# Patient Record
Sex: Female | Born: 1960 | Race: White | Hispanic: No | Marital: Married | Smoking: Former smoker
Health system: Southern US, Community
[De-identification: ages and names within clinical notes are randomized; demographics above are authoritative.]

## PROBLEM LIST (undated history)

## (undated) DIAGNOSIS — J9610 Chronic respiratory failure, unspecified whether with hypoxia or hypercapnia: Secondary | ICD-10-CM

## (undated) DIAGNOSIS — Z8774 Personal history of (corrected) congenital malformations of heart and circulatory system: Secondary | ICD-10-CM

## (undated) DIAGNOSIS — I700 Atherosclerosis of aorta: Secondary | ICD-10-CM

## (undated) DIAGNOSIS — I209 Angina pectoris, unspecified: Secondary | ICD-10-CM

## (undated) DIAGNOSIS — J189 Pneumonia, unspecified organism: Secondary | ICD-10-CM

## (undated) DIAGNOSIS — Z960 Presence of urogenital implants: Secondary | ICD-10-CM

## (undated) DIAGNOSIS — T8859XA Other complications of anesthesia, initial encounter: Secondary | ICD-10-CM

## (undated) DIAGNOSIS — F32A Depression, unspecified: Secondary | ICD-10-CM

## (undated) DIAGNOSIS — D649 Anemia, unspecified: Secondary | ICD-10-CM

## (undated) DIAGNOSIS — F419 Anxiety disorder, unspecified: Secondary | ICD-10-CM

## (undated) DIAGNOSIS — I480 Paroxysmal atrial fibrillation: Secondary | ICD-10-CM

## (undated) DIAGNOSIS — F329 Major depressive disorder, single episode, unspecified: Secondary | ICD-10-CM

## (undated) DIAGNOSIS — I639 Cerebral infarction, unspecified: Secondary | ICD-10-CM

## (undated) DIAGNOSIS — Z7901 Long term (current) use of anticoagulants: Secondary | ICD-10-CM

## (undated) DIAGNOSIS — K222 Esophageal obstruction: Secondary | ICD-10-CM

## (undated) DIAGNOSIS — E119 Type 2 diabetes mellitus without complications: Secondary | ICD-10-CM

## (undated) DIAGNOSIS — M51369 Other intervertebral disc degeneration, lumbar region without mention of lumbar back pain or lower extremity pain: Secondary | ICD-10-CM

## (undated) DIAGNOSIS — J449 Chronic obstructive pulmonary disease, unspecified: Secondary | ICD-10-CM

## (undated) DIAGNOSIS — IMO0001 Reserved for inherently not codable concepts without codable children: Secondary | ICD-10-CM

## (undated) DIAGNOSIS — Z7952 Long term (current) use of systemic steroids: Secondary | ICD-10-CM

## (undated) DIAGNOSIS — E785 Hyperlipidemia, unspecified: Secondary | ICD-10-CM

## (undated) DIAGNOSIS — Z8489 Family history of other specified conditions: Secondary | ICD-10-CM

## (undated) DIAGNOSIS — Z9981 Dependence on supplemental oxygen: Secondary | ICD-10-CM

## (undated) DIAGNOSIS — J45909 Unspecified asthma, uncomplicated: Secondary | ICD-10-CM

## (undated) DIAGNOSIS — K219 Gastro-esophageal reflux disease without esophagitis: Secondary | ICD-10-CM

## (undated) DIAGNOSIS — G4730 Sleep apnea, unspecified: Secondary | ICD-10-CM

## (undated) DIAGNOSIS — I471 Supraventricular tachycardia, unspecified: Secondary | ICD-10-CM

## (undated) DIAGNOSIS — Z79899 Other long term (current) drug therapy: Secondary | ICD-10-CM

## (undated) DIAGNOSIS — G3184 Mild cognitive impairment, so stated: Secondary | ICD-10-CM

## (undated) DIAGNOSIS — I10 Essential (primary) hypertension: Secondary | ICD-10-CM

## (undated) DIAGNOSIS — E039 Hypothyroidism, unspecified: Secondary | ICD-10-CM

## (undated) DIAGNOSIS — R112 Nausea with vomiting, unspecified: Secondary | ICD-10-CM

## (undated) HISTORY — PX: BLADDER SURGERY: SHX569

## (undated) HISTORY — PX: ANKLE SURGERY: SHX546

## (undated) HISTORY — PX: TUBAL LIGATION: SHX77

## (undated) HISTORY — DX: Presence of urogenital implants: Z96.0

## (undated) HISTORY — PX: WRIST SURGERY: SHX841

## (undated) HISTORY — PX: ESOPHAGEAL DILATION: SHX303

## (undated) SURGERY — Surgical Case
Anesthesia: *Unknown

---

## 2007-01-08 ENCOUNTER — Other Ambulatory Visit: Payer: Self-pay

## 2007-01-08 ENCOUNTER — Emergency Department: Payer: Self-pay | Admitting: Emergency Medicine

## 2007-01-21 ENCOUNTER — Ambulatory Visit: Payer: Self-pay | Admitting: Nurse Practitioner

## 2007-07-26 ENCOUNTER — Emergency Department: Payer: Self-pay | Admitting: Emergency Medicine

## 2012-03-09 ENCOUNTER — Emergency Department: Payer: Self-pay | Admitting: *Deleted

## 2012-03-10 LAB — CBC WITH DIFFERENTIAL/PLATELET
Basophil %: 0.4 %
Eosinophil #: 0.1 10*3/uL (ref 0.0–0.7)
Eosinophil %: 0.5 %
HGB: 11.2 g/dL — ABNORMAL LOW (ref 12.0–16.0)
Lymphocyte #: 3.5 10*3/uL (ref 1.0–3.6)
MCH: 23.3 pg — ABNORMAL LOW (ref 26.0–34.0)
MCHC: 30.9 g/dL — ABNORMAL LOW (ref 32.0–36.0)
Monocyte #: 1.3 x10 3/mm — ABNORMAL HIGH (ref 0.2–0.9)
Monocyte %: 9.2 %
Neutrophil #: 9.6 10*3/uL — ABNORMAL HIGH (ref 1.4–6.5)
Neutrophil %: 65.7 %
RDW: 17.6 % — ABNORMAL HIGH (ref 11.5–14.5)

## 2012-03-10 LAB — COMPREHENSIVE METABOLIC PANEL
BUN: 21 mg/dL — ABNORMAL HIGH (ref 7–18)
Bilirubin,Total: 0.2 mg/dL (ref 0.2–1.0)
Calcium, Total: 9 mg/dL (ref 8.5–10.1)
Chloride: 106 mmol/L (ref 98–107)
EGFR (African American): >60
EGFR (Non-African Amer.): >60
Glucose: 94 mg/dL (ref 65–99)
Osmolality: 284 (ref 275–301)
Potassium: 3.8 mmol/L (ref 3.5–5.1)
SGOT(AST): 14 U/L — ABNORMAL LOW (ref 15–37)
SGPT (ALT): 31 U/L
Sodium: 141 mmol/L (ref 136–145)
Total Protein: 6.5 g/dL (ref 6.4–8.2)

## 2012-03-10 LAB — SEDIMENTATION RATE: Erythrocyte Sed Rate: 10 mm/hr (ref 0–20)

## 2012-06-03 ENCOUNTER — Ambulatory Visit: Payer: Self-pay | Admitting: Oncology

## 2012-06-11 ENCOUNTER — Ambulatory Visit: Payer: Self-pay | Admitting: Oncology

## 2012-06-16 LAB — RETICULOCYTES: Reticulocyte: 1.2 % (ref 0.5–2.2)

## 2012-06-16 LAB — CBC CANCER CENTER
Basophil #: 0.1 x10 3/mm (ref 0.0–0.1)
Basophil %: 0.7 %
Eosinophil #: 0.2 x10 3/mm (ref 0.0–0.7)
Eosinophil %: 2.3 %
HCT: 35.5 % (ref 35.0–47.0)
Lymphocyte #: 2.5 x10 3/mm (ref 1.0–3.6)
Monocyte #: 0.9 x10 3/mm (ref 0.2–0.9)
Neutrophil #: 5.8 x10 3/mm (ref 1.4–6.5)
Platelet: 362 x10 3/mm (ref 150–440)
RDW: 19.3 % — ABNORMAL HIGH (ref 11.5–14.5)
WBC: 9.5 x10 3/mm (ref 3.6–11.0)

## 2012-06-16 LAB — IRON AND TIBC
Iron Bind.Cap.(Total): 472 ug/dL — ABNORMAL HIGH (ref 250–450)
Iron: 35 ug/dL — ABNORMAL LOW (ref 50–170)

## 2012-06-16 LAB — SEDIMENTATION RATE: Erythrocyte Sed Rate: 9 mm/hr (ref 0–20)

## 2012-06-16 LAB — LACTATE DEHYDROGENASE: LDH: 276 U/L — ABNORMAL HIGH (ref 81–234)

## 2012-06-16 LAB — FOLATE: Folic Acid: 13.1 ng/mL (ref 3.1–100.0)

## 2012-06-17 LAB — PROT IMMUNOELECTROPHORES(ARMC)

## 2012-07-12 ENCOUNTER — Ambulatory Visit: Payer: Self-pay | Admitting: Oncology

## 2012-08-26 ENCOUNTER — Ambulatory Visit: Payer: Self-pay | Admitting: Oncology

## 2012-08-26 LAB — CBC CANCER CENTER
Basophil %: 0.3 %
Eosinophil #: 0.2 x10 3/mm (ref 0.0–0.7)
HCT: 39.8 % (ref 35.0–47.0)
HGB: 13.1 g/dL (ref 12.0–16.0)
MCH: 27.8 pg (ref 26.0–34.0)
MCHC: 33.1 g/dL (ref 32.0–36.0)
Monocyte #: 0.7 x10 3/mm (ref 0.2–0.9)
Neutrophil #: 5.5 x10 3/mm (ref 1.4–6.5)
Neutrophil %: 67.6 %
Platelet: 248 x10 3/mm (ref 150–440)
WBC: 8.1 x10 3/mm (ref 3.6–11.0)

## 2012-08-26 LAB — CREATININE, SERUM
EGFR (African American): >60
EGFR (Non-African Amer.): >60

## 2012-08-26 LAB — IRON AND TIBC
Iron Saturation: 19 %
Iron: 63 ug/dL (ref 50–170)
Unbound Iron-Bind.Cap.: 267 ug/dL

## 2012-09-11 ENCOUNTER — Ambulatory Visit: Payer: Self-pay | Admitting: Oncology

## 2012-10-11 ENCOUNTER — Ambulatory Visit: Payer: Self-pay | Admitting: Oncology

## 2012-10-18 DIAGNOSIS — R0602 Shortness of breath: Secondary | ICD-10-CM | POA: Insufficient documentation

## 2013-06-24 DIAGNOSIS — Z7682 Awaiting organ transplant status: Secondary | ICD-10-CM | POA: Insufficient documentation

## 2013-07-27 ENCOUNTER — Encounter: Payer: Self-pay | Admitting: Internal Medicine

## 2013-08-11 ENCOUNTER — Encounter: Payer: Self-pay | Admitting: Internal Medicine

## 2013-09-11 ENCOUNTER — Encounter: Payer: Self-pay | Admitting: Internal Medicine

## 2014-06-03 DIAGNOSIS — M5136 Other intervertebral disc degeneration, lumbar region: Secondary | ICD-10-CM | POA: Insufficient documentation

## 2014-06-03 DIAGNOSIS — M51369 Other intervertebral disc degeneration, lumbar region without mention of lumbar back pain or lower extremity pain: Secondary | ICD-10-CM | POA: Insufficient documentation

## 2014-06-03 DIAGNOSIS — M5416 Radiculopathy, lumbar region: Secondary | ICD-10-CM | POA: Insufficient documentation

## 2014-07-06 DIAGNOSIS — M7062 Trochanteric bursitis, left hip: Secondary | ICD-10-CM | POA: Insufficient documentation

## 2015-08-23 ENCOUNTER — Encounter: Payer: Self-pay | Admitting: *Deleted

## 2015-08-23 ENCOUNTER — Ambulatory Visit
Admission: RE | Admit: 2015-08-23 | Discharge: 2015-08-23 | Disposition: A | Discharge status: Home / Self Care | Payer: Medicare Other | Source: Ambulatory Visit | Attending: Cardiology | Admitting: Cardiology

## 2015-08-23 ENCOUNTER — Encounter
Admission: RE | Disposition: A | Discharge status: Home / Self Care | Payer: Self-pay | Source: Ambulatory Visit | Attending: Cardiology

## 2015-08-23 DIAGNOSIS — E785 Hyperlipidemia, unspecified: Secondary | ICD-10-CM | POA: Insufficient documentation

## 2015-08-23 DIAGNOSIS — M5136 Other intervertebral disc degeneration, lumbar region: Secondary | ICD-10-CM | POA: Diagnosis not present

## 2015-08-23 DIAGNOSIS — M549 Dorsalgia, unspecified: Secondary | ICD-10-CM | POA: Diagnosis not present

## 2015-08-23 DIAGNOSIS — K222 Esophageal obstruction: Secondary | ICD-10-CM | POA: Insufficient documentation

## 2015-08-23 DIAGNOSIS — G4730 Sleep apnea, unspecified: Secondary | ICD-10-CM | POA: Diagnosis not present

## 2015-08-23 DIAGNOSIS — G8929 Other chronic pain: Secondary | ICD-10-CM | POA: Insufficient documentation

## 2015-08-23 DIAGNOSIS — Z87891 Personal history of nicotine dependence: Secondary | ICD-10-CM | POA: Insufficient documentation

## 2015-08-23 DIAGNOSIS — Z882 Allergy status to sulfonamides status: Secondary | ICD-10-CM | POA: Diagnosis not present

## 2015-08-23 DIAGNOSIS — R0602 Shortness of breath: Secondary | ICD-10-CM | POA: Diagnosis not present

## 2015-08-23 DIAGNOSIS — Z825 Family history of asthma and other chronic lower respiratory diseases: Secondary | ICD-10-CM | POA: Insufficient documentation

## 2015-08-23 DIAGNOSIS — Z885 Allergy status to narcotic agent status: Secondary | ICD-10-CM | POA: Diagnosis not present

## 2015-08-23 DIAGNOSIS — G43909 Migraine, unspecified, not intractable, without status migrainosus: Secondary | ICD-10-CM | POA: Insufficient documentation

## 2015-08-23 DIAGNOSIS — R002 Palpitations: Secondary | ICD-10-CM | POA: Insufficient documentation

## 2015-08-23 DIAGNOSIS — Z79899 Other long term (current) drug therapy: Secondary | ICD-10-CM | POA: Insufficient documentation

## 2015-08-23 DIAGNOSIS — F419 Anxiety disorder, unspecified: Secondary | ICD-10-CM | POA: Diagnosis not present

## 2015-08-23 DIAGNOSIS — Z8249 Family history of ischemic heart disease and other diseases of the circulatory system: Secondary | ICD-10-CM | POA: Diagnosis not present

## 2015-08-23 DIAGNOSIS — R079 Chest pain, unspecified: Secondary | ICD-10-CM | POA: Insufficient documentation

## 2015-08-23 DIAGNOSIS — J449 Chronic obstructive pulmonary disease, unspecified: Secondary | ICD-10-CM | POA: Insufficient documentation

## 2015-08-23 DIAGNOSIS — Z833 Family history of diabetes mellitus: Secondary | ICD-10-CM | POA: Diagnosis not present

## 2015-08-23 DIAGNOSIS — M4802 Spinal stenosis, cervical region: Secondary | ICD-10-CM | POA: Diagnosis not present

## 2015-08-23 DIAGNOSIS — K589 Irritable bowel syndrome without diarrhea: Secondary | ICD-10-CM | POA: Insufficient documentation

## 2015-08-23 DIAGNOSIS — Z8489 Family history of other specified conditions: Secondary | ICD-10-CM | POA: Diagnosis not present

## 2015-08-23 DIAGNOSIS — I10 Essential (primary) hypertension: Secondary | ICD-10-CM | POA: Insufficient documentation

## 2015-08-23 DIAGNOSIS — R9439 Abnormal result of other cardiovascular function study: Secondary | ICD-10-CM | POA: Insufficient documentation

## 2015-08-23 HISTORY — DX: Angina pectoris, unspecified: I20.9

## 2015-08-23 HISTORY — DX: Essential (primary) hypertension: I10

## 2015-08-23 HISTORY — DX: Depression, unspecified: F32.A

## 2015-08-23 HISTORY — DX: Gastro-esophageal reflux disease without esophagitis: K21.9

## 2015-08-23 HISTORY — DX: Major depressive disorder, single episode, unspecified: F32.9

## 2015-08-23 HISTORY — DX: Pneumonia, unspecified organism: J18.9

## 2015-08-23 HISTORY — DX: Anxiety disorder, unspecified: F41.9

## 2015-08-23 HISTORY — DX: Sleep apnea, unspecified: G47.30

## 2015-08-23 HISTORY — DX: Chronic obstructive pulmonary disease, unspecified: J44.9

## 2015-08-23 HISTORY — PX: CARDIAC CATHETERIZATION: SHX172

## 2015-08-23 HISTORY — DX: Hypothyroidism, unspecified: E03.9

## 2015-08-23 HISTORY — DX: Type 2 diabetes mellitus without complications: E11.9

## 2015-08-23 HISTORY — DX: Reserved for inherently not codable concepts without codable children: IMO0001

## 2015-08-23 LAB — GLUCOSE, CAPILLARY: GLUCOSE-CAPILLARY: 63 mg/dL — AB (ref 65–99)

## 2015-08-23 SURGERY — LEFT HEART CATH
Anesthesia: Moderate Sedation | Laterality: Left

## 2015-08-23 MED ORDER — SODIUM CHLORIDE 0.9 % IJ SOLN: 3.0000 mL | INTRAMUSCULAR | Status: DC | PRN

## 2015-08-23 MED ORDER — FENTANYL CITRATE (PF) 100 MCG/2ML IJ SOLN
INTRAMUSCULAR | Status: AC
  Filled 2015-08-23: qty 2

## 2015-08-23 MED ORDER — MIDAZOLAM HCL 2 MG/2ML IJ SOLN
INTRAMUSCULAR | Status: AC
  Filled 2015-08-23: qty 2

## 2015-08-23 MED ORDER — IOHEXOL 300 MG/ML  SOLN
INTRAMUSCULAR | Status: DC | PRN
  Administered 2015-08-23: 90 mL via INTRA_ARTERIAL

## 2015-08-23 MED ORDER — SODIUM CHLORIDE 0.9 % IV SOLN: 250.0000 mL | INTRAVENOUS | Status: DC | PRN

## 2015-08-23 MED ORDER — MIDAZOLAM HCL 2 MG/2ML IJ SOLN
INTRAMUSCULAR | Status: DC | PRN
  Administered 2015-08-23: 1 mg via INTRAVENOUS

## 2015-08-23 MED ORDER — FENTANYL CITRATE (PF) 100 MCG/2ML IJ SOLN
INTRAMUSCULAR | Status: DC | PRN
  Administered 2015-08-23 (×2): 25 ug via INTRAVENOUS

## 2015-08-23 MED ORDER — HEPARIN (PORCINE) IN NACL 2-0.9 UNIT/ML-% IJ SOLN
INTRAMUSCULAR | Status: AC
  Filled 2015-08-23: qty 1000

## 2015-08-23 MED ORDER — SODIUM CHLORIDE 0.9 % IV SOLN: INTRAVENOUS | Status: DC

## 2015-08-23 MED ORDER — SODIUM CHLORIDE 0.9 % IJ SOLN: 3.0000 mL | Freq: Two times a day (BID) | INTRAMUSCULAR | Status: DC

## 2015-08-23 MED ORDER — SODIUM CHLORIDE 0.9 % WEIGHT BASED INFUSION: 3.0000 mL/kg/h | INTRAVENOUS | Status: DC

## 2015-08-23 SURGICAL SUPPLY — 9 items
CATH INFINITI 5FR ANG PIGTAIL (CATHETERS) ×3 IMPLANT
CATH INFINITI 5FR JL4 (CATHETERS) ×3 IMPLANT
CATH INFINITI JR4 5F (CATHETERS) ×3 IMPLANT
DEVICE CLOSURE MYNXGRIP 5F (Vascular Products) ×3 IMPLANT
KIT MANI 3VAL PERCEP (MISCELLANEOUS) ×3 IMPLANT
NEEDLE PERC 18GX7CM (NEEDLE) ×3 IMPLANT
PACK CARDIAC CATH (CUSTOM PROCEDURE TRAY) ×3 IMPLANT
SHEATH AVANTI 5FR X 11CM (SHEATH) ×3 IMPLANT
WIRE EMERALD 3MM-J .035X150CM (WIRE) ×3 IMPLANT

## 2015-08-23 NOTE — Discharge Instructions (Signed)
Angiogram, Care After °Refer to this sheet in the next few weeks. These instructions provide you with information about caring for yourself after your procedure. Your health care provider may also give you more specific instructions. Your treatment has been planned according to current medical practices, but problems sometimes occur. Call your health care provider if you have any problems or questions after your procedure. °WHAT TO EXPECT AFTER THE PROCEDURE °After your procedure, it is typical to have the following: °· Bruising at the catheter insertion site that usually fades within 1-2 weeks. °· Blood collecting in the tissue (hematoma) that may be painful to the touch. It should usually decrease in size and tenderness within 1-2 weeks. °HOME CARE INSTRUCTIONS °· Take medicines only as directed by your health care provider. °· You may shower 24-48 hours after the procedure or as directed by your health care provider. Remove the bandage (dressing) and gently wash the site with plain soap and water. Pat the area dry with a clean towel. Do not rub the site, because this may cause bleeding. °· Do not take baths, swim, or use a hot tub until your health care provider approves. °· Check your insertion site every day for redness, swelling, or drainage. °· Do not apply powder or lotion to the site. °· Do not lift over 10 lb (4.5 kg) for 5 days after your procedure or as directed by your health care provider. °· Ask your health care provider when it is okay to: °¨ Return to work or school. °¨ Resume usual physical activities or sports. °¨ Resume sexual activity. °· Do not drive home if you are discharged the same day as the procedure. Have someone else drive you. °· You may drive 24 hours after the procedure unless otherwise instructed by your health care provider. °· Do not operate machinery or power tools for 24 hours after the procedure or as directed by your health care provider. °· If your procedure was done as an  outpatient procedure, which means that you went home the same day as your procedure, a responsible adult should be with you for the first 24 hours after you arrive home. °· Keep all follow-up visits as directed by your health care provider. This is important. °SEEK MEDICAL CARE IF: °· You have a fever. °· You have chills. °· You have increased bleeding from the catheter insertion site. Hold pressure on the site. °SEEK IMMEDIATE MEDICAL CARE IF: °· You have unusual pain at the catheter insertion site. °· You have redness, warmth, or swelling at the catheter insertion site. °· You have drainage (other than a small amount of blood on the dressing) from the catheter insertion site. °· The catheter insertion site is bleeding, and the bleeding does not stop after 30 minutes of holding steady pressure on the site. °· The area near or just beyond the catheter insertion site becomes pale, cool, tingly, or numb. °  °This information is not intended to replace advice given to you by your health care provider. Make sure you discuss any questions you have with your health care provider. °  °Document Released: 05/16/2005 Document Revised: 11/18/2014 Document Reviewed: 03/31/2013 °Elsevier Interactive Patient Education ©2016 [employer] ° °

## 2015-08-23 NOTE — H&P (Signed)
Chief Complaint: Chief Complaint  Patient presents with  . Follow-up  abnormal stress test  Date of Service: 08/21/2015 Date of Birth: 21-Aug-1961 PCP: Erasmo Downer, NP  History of Present Illness: Kerri Steele is a 54 y.o.female patient who returns for follow-up visit. She still has some shortness of breath as well as palpitations. She recently underwent an echocardiogram and Holter monitor. The echocardiogram revealed normal left ventricular function with an ejection fraction of 50%. There is trivial MR TR and AI. Holter monitor revealed sinus rhythm with an average rate of 82 beats per minute. Patient continues to have chest discomfort in the middle of her chest with radiation to her back. It is quite intense. She underwent a functional study showing evidence anterior ischemia. We discussed this at length as well as the risk and benefits a left cardiac catheterization. Will recommend proceed left cardiac catheterization to evaluate coronary anatomy. Past Medical and Surgical History  Past Medical History Past Medical History  Diagnosis Date  . Anxiety  . Cervical spinal stenosis 06/03/2014  . Chronic back pain  . COPD (chronic obstructive pulmonary disease) with emphysema 10/18/2012  Severe COPD. FEV1 0.64 L.  . DDD (degenerative disc disease), lumbar 06/03/2014  . Esophageal stricture  . History of tobacco use  . Hyperlipidemia  . Hypertension  . Irritable bowel syndrome  . Migraines  . Shortness of breath 10/18/2012  A. CT chest w/ contrast 06/25/12: No evidence of PE to the early subsegmental level. Mixed mosaic areas of perfusion abnormality most likely secondary to small vessel disease. B. CT chest w/p contrast 04/10/11: Mild scarring involving RML and lingula. No evidence of ILD. C. PFTs 06/06/11: FVC 1.12 L (37% pred), FEV1 0.64 L (28% pred), TLC 65% pred, DLCO 40% pred. D. Sleep study 05/02/11: No si  . Sleep apnea   Past Surgical History She has a past surgical history that  includes Bladder surgeries; Laparoscopic tubal ligation; de Quervain's tenosynovitis; Bilateral ankle surgery.; and Tubal ligation.   Medications and Allergies  Current Medications  Current Outpatient Prescriptions  Medication Sig Dispense Refill  . arformoterol (BROVANA) 15 mcg/2 mL nebulizer solution Take 15 mcg by nebulization every 12 (twelve) hours.  Marland Kitchen atorvastatin (LIPITOR) 20 MG tablet Take 20 mg by mouth daily.  . benazepril (LOTENSIN) 10 MG tablet Take 1 tablet by mouth daily.  . DULoxetine (CYMBALTA) 60 MG DR capsule 1 cap by mouth daily  . gabapentin (NEURONTIN) 800 MG tablet 1 po tid 90 tablet 11  . glipiZIDE (GLUCOTROL XL) 10 MG XL tablet  . [START ON 09/04/2015] HYDROcodone-acetaminophen (NORCO) 5-325 mg tablet 1 po qHS prn Earliest Fill Date: 09/04/15 30 tablet 0  . ipratropium-albuterol (COMBIVENT) 18-103 mcg/actuation inhaler Inhale 2 inhalations into the lungs 4 (four) times daily as needed.  Marland Kitchen levothyroxine (SYNTHROID, LEVOTHROID) 50 MCG tablet 50 mcg daily.  . magnesium oxide (MAG-OX) 400 mg tablet Take 400 mg by mouth daily.  . methocarbamol (ROBAXIN) 750 MG tablet Take 1 tablet (750 mg total) by mouth 3 (three) times daily. 90 tablet 5  . omeprazole (PRILOSEC) 20 MG DR capsule Take 1 capsule by mouth daily.  Marland Kitchen SPIRIVA WITH HANDIHALER 18 mcg inhalation capsule Place 1 mcg into inhaler and inhale daily.  . traMADol (ULTRAM) 50 mg tablet 1-2 po bid prn 120 tablet 5  . verapamil (CALAN-SR) 120 MG SR tablet Take 1 tablet (120 mg total) by mouth once daily. 30 tablet 11  . zolpidem (AMBIEN) 5 MG tablet Take 5 mg by  mouth nightly as needed for Sleep.   No current facility-administered medications for this visit.   Allergies: Morphine and Sulfa (sulfonamide antibiotics)  Social and Family History  Social History reports that she quit smoking about 5 years ago. Her smoking use included Cigarettes. She has a 40.00 pack-year smoking history. She has never used smokeless  tobacco. She reports that she drinks about 7.5 oz of alcohol per week   Family History Family History  Problem Relation Age of Onset  . Asthma Mother  . Lupus Mother  . Diabetes mellitus Mother  . Heart failure Father  . Hyperlipidemia Father  . Hypertension Father  . Hypertension Brother  . Diabetes mellitus Brother   Review of Systems  Review of Systems  Constitutional: Negative for chills, diaphoresis, fever, malaise/fatigue and weight loss.  HENT: Negative for congestion, ear discharge, hearing loss and tinnitus.  Eyes: Negative for blurred vision.  Respiratory: Positive for shortness of breath. Negative for cough, hemoptysis, sputum production and wheezing.  Cardiovascular: Positive for chest pain. Negative for orthopnea, claudication, leg swelling and PND.  Gastrointestinal: Negative for abdominal pain, blood in stool, constipation, diarrhea, heartburn, melena, nausea and vomiting.  Genitourinary: Negative for dysuria, frequency, hematuria and urgency.  Musculoskeletal: Negative for back pain, falls, joint pain and myalgias.  Skin: Negative for itching and rash.  Neurological: Negative for dizziness, tingling, focal weakness, loss of consciousness and headaches.  Endo/Heme/Allergies: Negative for polydipsia. Does not bruise/bleed easily.  Psychiatric/Behavioral: Negative for depression, memory loss and substance abuse. The patient is not nervous/anxious.    Physical Examination   Vitals: Visit Vitals  . BP 110/70 (BP Location: Left upper arm, Patient Position: Sitting, BP Cuff Size: Adult)  . Pulse 72  . Ht 152.4 cm (5')  . Wt 72.1 kg (159 lb)  . BMI 31.05 kg/m2   Ht:152.4 cm (5') Wt:72.1 kg (159 lb) NGE:XBMWBSA:Body surface area is 1.75 meters squared. Body mass index is 31.05 kg/(m^2).  Wt Readings from Last 3 Encounters:  08/21/15 72.1 kg (159 lb)  07/31/15 72.1 kg (159 lb)  07/05/15 74.8 kg (165 lb)   BP Readings from Last 3 Encounters:  08/21/15 110/70  07/31/15  148/80  07/05/15 102/63   General appearance appears in no acute distress  Head Mouth and Eye exam Normocephalic, without obvious abnormality, atraumatic Dentition is good Eyes appear anicteric   Neck exam Thyroid: normal  Nodes: no obvious adenopathy  LUNGS Breath Sounds: Normal Percussion: decreased diaphragmatic excursion bilaterally  CARDIOVASCULAR JVP CV wave: no HJR: no Elevation at 90 degrees: None Carotid Pulse: normal pulsation bilaterally Bruit: None Apex: apical impulse normal  Auscultation Rhythm: normal sinus rhythm S1: normal S2: normal Clicks: no Rub: no Murmurs: no murmurs  Gallop: None ABDOMEN Liver enlargement: no Pulsatile aorta: no Ascites: no Bruits: no  EXTREMITIES Clubbing: no Edema: 1-2 + bilateral pedal edema Pulses: peripheral pulses symmetrical Femoral Bruits: no Amputation: no SKIN Rash: no Cyanosis: no Embolic phemonenon: no Bruising: no NEURO Alert and Oriented to person, place and time: yes Non focal: yes  PSYCH: Pt appears to have normal affect  LABS REVIEWED Last 3 CBC results: Lab Results  Component Value Date  WBC 8.7 06/24/2013  WBC 13.7 (H) 05/03/2013  WBC 10.0 (H) 11/22/2009   Lab Results  Component Value Date  HGB 13.2 06/24/2013  HGB 13.4 05/03/2013  HGB 10.8 (L) 11/22/2009   Lab Results  Component Value Date  HCT 0.40 06/24/2013  HCT 0.42 05/03/2013  HCT 0.36 11/22/2009   Lab  Results  Component Value Date  PLT 313 06/24/2013  PLT 372 05/03/2013  PLT 359 11/22/2009   Lab Results  Component Value Date  CREATININE 0.8 06/24/2013  BUN 15 06/24/2013  NA 140 06/24/2013  K 4.2 06/24/2013  CL 102 06/24/2013  CO2 28 06/24/2013   Lab Results  Component Value Date  ALT 54 06/24/2013  AST 34 06/24/2013  ALKPHOS 100 06/24/2013   Assessment and Plan   55 y.o. female with  ICD-10-CM ICD-9-CM  1. Atypical chest pain of ischemia with- exertion. Resting EKG unremarkable. No sustained  arrhythmias on Holter monitor. Functional study showed anterior ischemia. This is a high risk functional study. Patient has [place] class 3 angina. Recommend proceed left cardiac catheterization evaluate coronary anatomy. Further recommendations after cardiac catheterization is completed R07.89 786.59  2. Pulmonary emphysema, unspecified emphysema type-being treated with bronchodilators and oxygen. Would continue with this avoiding tobacco J43.9 492.8  3. Shortness of breath-Stable at present. Will follow R06.02 786.05  4. Tachycardia-no sustained arrhythmias. R00.0 785.0     Return in about 1 week (around 08/28/2015).  These notes generated with voice recognition software. I apologize for typographical errors.  Denton Ar, MD

## 2015-09-27 ENCOUNTER — Emergency Department
Admission: EM | Admit: 2015-09-27 | Discharge: 2015-09-27 | Disposition: A | Discharge status: Home / Self Care | Payer: Medicare Other | Attending: Emergency Medicine | Admitting: Emergency Medicine

## 2015-09-27 DIAGNOSIS — Z79899 Other long term (current) drug therapy: Secondary | ICD-10-CM | POA: Diagnosis not present

## 2015-09-27 DIAGNOSIS — M5432 Sciatica, left side: Secondary | ICD-10-CM

## 2015-09-27 DIAGNOSIS — E119 Type 2 diabetes mellitus without complications: Secondary | ICD-10-CM | POA: Diagnosis not present

## 2015-09-27 DIAGNOSIS — I10 Essential (primary) hypertension: Secondary | ICD-10-CM | POA: Diagnosis not present

## 2015-09-27 DIAGNOSIS — M545 Low back pain: Secondary | ICD-10-CM | POA: Diagnosis present

## 2015-09-27 DIAGNOSIS — Z7952 Long term (current) use of systemic steroids: Secondary | ICD-10-CM | POA: Diagnosis not present

## 2015-09-27 MED ORDER — PREDNISONE 10 MG (21) PO TBPK: ORAL_TABLET | ORAL | Status: DC

## 2015-09-27 MED ORDER — DEXAMETHASONE SODIUM PHOSPHATE 10 MG/ML IJ SOLN
10.0000 mg | Freq: Once | INTRAMUSCULAR | Status: AC
  Administered 2015-09-27: 10 mg via INTRAMUSCULAR

## 2015-09-27 MED ORDER — DEXAMETHASONE SODIUM PHOSPHATE 10 MG/ML IJ SOLN
INTRAMUSCULAR | Status: AC
  Administered 2015-09-27: 10 mg via INTRAMUSCULAR
  Filled 2015-09-27: qty 1

## 2015-09-27 NOTE — ED Notes (Signed)
Pt in today with complaints of lower back and left hip pain x 1 week.  Pt states she normally receives steroid shots from [facility] but they are unable to see her until Dec 15.  Pt states prescription meds are no longer helping the pain and she could not wait any longer; pt with difficulty ambulating due to pain.

## 2015-09-27 NOTE — ED Provider Notes (Signed)
[facility] Emergency Department Provider Note ____________________________________________  Time seen: Approximately 7:18 AM  I have reviewed the triage vital signs and the nursing notes.   HISTORY  Chief Complaint Back Pain   HPI Kerri Steele is a 54 y.o. female who presents to the emergency department for evaluation of lower back pain that radiates into her left hip. Pain has been worsening over the week and she has had no relief with robaxin and hydrocodone. She has an appointment scheduled with Dr. Yves Dill on October 26, 2015 for an epidural steroid injection. She denies new injury. Pain is the same as previous in location and quality. No loss of bowel or bladder control.   Past Medical History  Diagnosis Date  . Anginal pain (HCC)   . Hypertension   . Sleep apnea   . COPD (chronic obstructive pulmonary disease) (HCC)   . Shortness of breath dyspnea   . Pneumonia   . Hypothyroidism   . Diabetes mellitus without complication (HCC)   . Depression   . Anxiety   . GERD (gastroesophageal reflux disease)     Patient Active Problem List   Diagnosis Date Noted  . Breath shortness 10/18/2012    Past Surgical History  Procedure Laterality Date  . Esophageal dilation    . Tubal ligation    . Bladder surgery    . Ankle surgery    . Wrist surgery    . Cardiac catheterization Left 08/23/2015    Procedure: Left Heart Cath and Coronary Angiography;  Surgeon: Dalia Heading, MD;  Location: [facility] INVASIVE CV LAB;  Service: Cardiovascular;  Laterality: Left;    Current Outpatient Rx  Name  Route  Sig  Dispense  Refill  . albuterol-ipratropium (COMBIVENT) 18-103 MCG/ACT inhaler   Inhalation   Inhale 2 puffs into the lungs every 4 (four) hours as needed for wheezing or shortness of breath.         Marland Kitchen arformoterol (BROVANA) 15 MCG/2ML NEBU   Nebulization   Take 15 mcg by nebulization 2 (two) times daily.         Marland Kitchen atorvastatin (LIPITOR) 20 MG tablet    Oral   Take 20 mg by mouth daily.         . benazepril (LOTENSIN) 10 MG tablet   Oral   Take 10 mg by mouth daily.         . DULoxetine (CYMBALTA) 60 MG capsule   Oral   Take 60 mg by mouth daily.         Marland Kitchen gabapentin (NEURONTIN) 800 MG tablet   Oral   Take 800 mg by mouth 3 (three) times daily.         Marland Kitchen glipiZIDE (GLUCOTROL XL) 10 MG 24 hr tablet   Oral   Take 10 mg by mouth daily with breakfast.         . HYDROcodone-acetaminophen (NORCO/VICODIN) 5-325 MG tablet   Oral   Take 1 tablet by mouth every 6 (six) hours as needed for moderate pain.         Marland Kitchen levothyroxine (SYNTHROID, LEVOTHROID) 50 MCG tablet   Oral   Take 50 mcg by mouth daily before breakfast.         . magnesium oxide (MAG-OX) 400 MG tablet   Oral   Take 400 mg by mouth daily.         . methocarbamol (ROBAXIN) 750 MG tablet   Oral   Take 750 mg by mouth 4 (four)  times daily.         Marland Kitchen. omeprazole (PRILOSEC) 20 MG capsule   Oral   Take 20 mg by mouth daily.         . predniSONE (STERAPRED UNI-PAK 21 TAB) 10 MG (21) TBPK tablet      Take 6 tablets on day 1 Take 5 tablets on day 2 Take 4 tablets on day 3 Take 3 tablets on day 4 Take 2 tablets on day 5 Take 1 tablet on day 6   21 tablet   0   . tiotropium (SPIRIVA) 18 MCG inhalation capsule   Inhalation   Place 18 mcg into inhaler and inhale daily.         . traMADol (ULTRAM) 50 MG tablet   Oral   Take 50 mg by mouth every 6 (six) hours as needed for moderate pain.         Marland Kitchen. zolpidem (AMBIEN) 5 MG tablet   Oral   Take 5 mg by mouth at bedtime as needed for sleep.           Allergies Morphine and related and Sulfa antibiotics  No family history on file.  Social History Social History  Substance Use Topics  . Smoking status: Never Smoker   . Smokeless tobacco: Not on file  . Alcohol Use: No    Review of Systems Constitutional: No recent illness. Eyes: No visual changes. ENT: No sore  throat. Cardiovascular: Denies chest pain or palpitations. Respiratory: Denies shortness of breath. Gastrointestinal: No abdominal pain.  Genitourinary: Negative for dysuria. Musculoskeletal: Pain in left lower back wit radiation into left hip. Skin: Negative for rash. Neurological: Negative for headaches, focal weakness or numbness. 10-point ROS otherwise negative.  ____________________________________________   PHYSICAL EXAM:  VITAL SIGNS: ED Triage Vitals  Enc Vitals Group     BP 09/27/15 0549 134/83 mmHg     Pulse Rate 09/27/15 0549 84     Resp 09/27/15 0549 20     Temp 09/27/15 0549 97.7 F (36.5 C)     Temp Source 09/27/15 0549 Oral     SpO2 09/27/15 0549 97 %     Weight 09/27/15 0549 160 lb (72.576 kg)     Height 09/27/15 0549 5\' 1"  (1.549 m)     Head Cir --      Peak Flow --      Pain Score 09/27/15 0548 9     Pain Loc --      Pain Edu? --      Excl. in GC? --     Constitutional: Alert and oriented. Well appearing and in no acute distress. Eyes: Conjunctivae are normal. EOMI. Head: Atraumatic. Nose: No congestion/rhinnorhea. Neck: No stridor.  Respiratory: Normal respiratory effort.   Musculoskeletal: Straight leg raise positive at 35 degrees.  Neurologic:  Normal speech and language. No gross focal neurologic deficits are appreciated. Speech is normal. No gait instability. Skin:  Skin is warm, dry and intact. Atraumatic. Psychiatric: Mood and affect are normal. Speech and behavior are normal.  ____________________________________________   LABS (all labs ordered are listed, but only abnormal results are displayed)  Labs Reviewed - No data to display ____________________________________________  RADIOLOGY  Not indicated. ____________________________________________   PROCEDURES  Procedure(s) performed: None   ____________________________________________   INITIAL IMPRESSION / ASSESSMENT AND PLAN / ED COURSE  Pertinent labs & imaging  results that were available during my care of the patient were reviewed by me and considered in my medical decision making (  see chart for details).  IM Decadron was given today. She will start the prednisone taper tonight. She is to continue the hydrocodone and robaxin as prescribed. She is to follow up with her primary care provider or return to the ER for symptoms that change or worsen. ____________________________________________   FINAL CLINICAL IMPRESSION(S) / ED DIAGNOSES  Final diagnoses:  Sciatica of left side       Chinita Pester, FNP 09/27/15 0741  Emily Filbert, MD 09/27/15 [phone]

## 2015-09-27 NOTE — ED Notes (Signed)
Patient ambulatory to triage with steady gait, without difficulty or distress noted; pt reports having lower back pain radiating down left hip/leg; st hx of same and normally takes steroid injections for such but unable to get appointment; takes robaxin and hydrocodone as well for

## 2015-10-24 ENCOUNTER — Emergency Department: Payer: Medicare Other

## 2015-10-24 ENCOUNTER — Emergency Department
Admission: EM | Admit: 2015-10-24 | Discharge: 2015-10-24 | Disposition: A | Discharge status: Home / Self Care | Payer: Medicare Other | Attending: Emergency Medicine | Admitting: Emergency Medicine

## 2015-10-24 ENCOUNTER — Encounter: Payer: Self-pay | Admitting: Emergency Medicine

## 2015-10-24 DIAGNOSIS — W208XXA Other cause of strike by thrown, projected or falling object, initial encounter: Secondary | ICD-10-CM | POA: Diagnosis not present

## 2015-10-24 DIAGNOSIS — Y998 Other external cause status: Secondary | ICD-10-CM | POA: Diagnosis not present

## 2015-10-24 DIAGNOSIS — Z79899 Other long term (current) drug therapy: Secondary | ICD-10-CM | POA: Diagnosis not present

## 2015-10-24 DIAGNOSIS — S92402A Displaced unspecified fracture of left great toe, initial encounter for closed fracture: Secondary | ICD-10-CM

## 2015-10-24 DIAGNOSIS — E119 Type 2 diabetes mellitus without complications: Secondary | ICD-10-CM | POA: Insufficient documentation

## 2015-10-24 DIAGNOSIS — Y9354 Activity, bowling: Secondary | ICD-10-CM | POA: Insufficient documentation

## 2015-10-24 DIAGNOSIS — S90112A Contusion of left great toe without damage to nail, initial encounter: Secondary | ICD-10-CM | POA: Insufficient documentation

## 2015-10-24 DIAGNOSIS — I10 Essential (primary) hypertension: Secondary | ICD-10-CM | POA: Diagnosis not present

## 2015-10-24 DIAGNOSIS — Z87891 Personal history of nicotine dependence: Secondary | ICD-10-CM | POA: Diagnosis not present

## 2015-10-24 DIAGNOSIS — Y9239 Other specified sports and athletic area as the place of occurrence of the external cause: Secondary | ICD-10-CM | POA: Insufficient documentation

## 2015-10-24 DIAGNOSIS — S99922A Unspecified injury of left foot, initial encounter: Secondary | ICD-10-CM | POA: Diagnosis present

## 2015-10-24 DIAGNOSIS — S92422A Displaced fracture of distal phalanx of left great toe, initial encounter for closed fracture: Secondary | ICD-10-CM | POA: Diagnosis not present

## 2015-10-24 MED ORDER — HYDROCODONE-ACETAMINOPHEN 5-325 MG PO TABS
1.0000 | ORAL_TABLET | Freq: Once | ORAL | Status: AC
  Administered 2015-10-24: 1 via ORAL
  Filled 2015-10-24: qty 1

## 2015-10-24 MED ORDER — HYDROCODONE-ACETAMINOPHEN 5-325 MG PO TABS: 1.0000 | ORAL_TABLET | ORAL | Status: DC | PRN

## 2015-10-24 NOTE — ED Provider Notes (Signed)
[facility] Emergency Department Provider Note  ____________________________________________  Time seen: Approximately 10:26 PM  I have reviewed the triage vital signs and the nursing notes.   HISTORY  Chief Complaint Foot Pain    HPI Roosevelt Bisher is a 54 y.o. female who presents to the emergency department complaining of left foot and left great toe pain.Patient states that she was bowling yesterday evening when she accidentally dropped a 16 pound bowling ball on the great toe and second toe of the left foot. She states that she immediately had pain but is trying to control symptoms with at home medications and ice. She states over the intervening period of 24 hours she has had increased pain and swelling to toes. She is concerned due to her diabetes with known diabetic neuropathy that there might "be something really wrong."   Past Medical History  Diagnosis Date  . Anginal pain (HCC)   . Hypertension   . Sleep apnea   . COPD (chronic obstructive pulmonary disease) (HCC)   . Shortness of breath dyspnea   . Pneumonia   . Hypothyroidism   . Diabetes mellitus without complication (HCC)   . Depression   . Anxiety   . GERD (gastroesophageal reflux disease)     Patient Active Problem List   Diagnosis Date Noted  . Breath shortness 10/18/2012    Past Surgical History  Procedure Laterality Date  . Esophageal dilation    . Tubal ligation    . Bladder surgery    . Ankle surgery    . Wrist surgery    . Cardiac catheterization Left 08/23/2015    Procedure: Left Heart Cath and Coronary Angiography;  Surgeon: Dalia Heading, MD;  Location: [facility] INVASIVE CV LAB;  Service: Cardiovascular;  Laterality: Left;    Current Outpatient Rx  Name  Route  Sig  Dispense  Refill  . albuterol-ipratropium (COMBIVENT) 18-103 MCG/ACT inhaler   Inhalation   Inhale 2 puffs into the lungs every 4 (four) hours as needed for wheezing or shortness of breath.         Marland Kitchen  arformoterol (BROVANA) 15 MCG/2ML NEBU   Nebulization   Take 15 mcg by nebulization 2 (two) times daily.         Marland Kitchen atorvastatin (LIPITOR) 20 MG tablet   Oral   Take 20 mg by mouth daily.         . benazepril (LOTENSIN) 10 MG tablet   Oral   Take 10 mg by mouth daily.         . DULoxetine (CYMBALTA) 60 MG capsule   Oral   Take 60 mg by mouth daily.         Marland Kitchen gabapentin (NEURONTIN) 800 MG tablet   Oral   Take 800 mg by mouth 3 (three) times daily.         Marland Kitchen glipiZIDE (GLUCOTROL XL) 10 MG 24 hr tablet   Oral   Take 10 mg by mouth daily with breakfast.         . HYDROcodone-acetaminophen (NORCO/VICODIN) 5-325 MG tablet   Oral   Take 1 tablet by mouth every 6 (six) hours as needed for moderate pain.         Marland Kitchen HYDROcodone-acetaminophen (NORCO/VICODIN) 5-325 MG tablet   Oral   Take 1 tablet by mouth every 4 (four) hours as needed for moderate pain.   20 tablet   0   . levothyroxine (SYNTHROID, LEVOTHROID) 50 MCG tablet   Oral  Take 50 mcg by mouth daily before breakfast.         . magnesium oxide (MAG-OX) 400 MG tablet   Oral   Take 400 mg by mouth daily.         . methocarbamol (ROBAXIN) 750 MG tablet   Oral   Take 750 mg by mouth 4 (four) times daily.         Marland Kitchen. omeprazole (PRILOSEC) 20 MG capsule   Oral   Take 20 mg by mouth daily.         . predniSONE (STERAPRED UNI-PAK 21 TAB) 10 MG (21) TBPK tablet      Take 6 tablets on day 1 Take 5 tablets on day 2 Take 4 tablets on day 3 Take 3 tablets on day 4 Take 2 tablets on day 5 Take 1 tablet on day 6   21 tablet   0   . tiotropium (SPIRIVA) 18 MCG inhalation capsule   Inhalation   Place 18 mcg into inhaler and inhale daily.         . traMADol (ULTRAM) 50 MG tablet   Oral   Take 50 mg by mouth every 6 (six) hours as needed for moderate pain.         Marland Kitchen. zolpidem (AMBIEN) 5 MG tablet   Oral   Take 5 mg by mouth at bedtime as needed for sleep.           Allergies Morphine and  related and Sulfa antibiotics  No family history on file.  Social History Social History  Substance Use Topics  . Smoking status: Former Games developermoker  . Smokeless tobacco: None  . Alcohol Use: No    Review of Systems Constitutional: No fever/chills Eyes: No visual changes. ENT: No sore throat. Cardiovascular: Denies chest pain. Respiratory: Denies shortness of breath. Gastrointestinal: No abdominal pain.  No nausea, no vomiting.  No diarrhea.  No constipation. Genitourinary: Negative for dysuria. Musculoskeletal: Negative for back pain. Endorses left foot pain. Skin: Negative for rash. Neurological: Negative for headaches, focal weakness or numbness.  10-point ROS otherwise negative.  ____________________________________________   PHYSICAL EXAM:  VITAL SIGNS: ED Triage Vitals  Enc Vitals Group     BP 10/24/15 2207 153/81 mmHg     Pulse Rate 10/24/15 2207 83     Resp 10/24/15 2207 18     Temp 10/24/15 2207 98.1 F (36.7 C)     Temp Source 10/24/15 2207 Oral     SpO2 10/24/15 2207 98 %     Weight 10/24/15 2207 160 lb (72.576 kg)     Height 10/24/15 2207 5\' 1"  (1.549 m)     Head Cir --      Peak Flow --      Pain Score 10/24/15 2209 9     Pain Loc --      Pain Edu? --      Excl. in GC? --     Constitutional: Alert and oriented. Well appearing and in no acute distress. Eyes: Conjunctivae are normal. PERRL. EOMI. Head: Atraumatic. Nose: No congestion/rhinnorhea. Mouth/Throat: Mucous membranes are moist.  Oropharynx non-erythematous. Neck: No stridor.   Cardiovascular: Normal rate, regular rhythm. Grossly normal heart sounds.  Good peripheral circulation. Respiratory: Normal respiratory effort.  No retractions. Lungs CTAB. Gastrointestinal: Soft and nontender. No distention. No abdominal bruits. No CVA tenderness. Musculoskeletal: Patient with edema to first and second toe and distal metatarsal bones left foot when compared with right foot. Ecchymosis noted to the  proximal  first digit. No gross deformity. Patient is extremely tender to palpation over first and second digits as well as distal metatarsal bones and left foot. No tenderness to palpation bilateral malleolus.  No joint effusions. Neurologic:  Normal speech and language. No gross focal neurologic deficits are appreciated. No gait instability. Skin:  Skin is warm, dry and intact. No rash noted. Psychiatric: Mood and affect are normal. Speech and behavior are normal.  ____________________________________________   LABS (all labs ordered are listed, but only abnormal results are displayed)  Labs Reviewed - No data to display ____________________________________________  EKG   ____________________________________________  RADIOLOGY  Left foot x-ray Impression: Comminuted fractures of the distal phalanx of the left first toe   Images were personally reviewed by myself. ____________________________________________   PROCEDURES  Procedure(s) performed: None  Critical Care performed: No  ____________________________________________   INITIAL IMPRESSION / ASSESSMENT AND PLAN / ED COURSE  Pertinent labs & imaging results that were available during my care of the patient were reviewed by me and considered in my medical decision making (see chart for details).  Patient's diagnosis is consistent with fracture of the left first toe. Patient's toes will be buddy taped in the emergency department to provide stability. Patient will be given a limited narcotic prescription and advised follow-up with podiatry. Patient is given instructions to use flat soled shoes for additional symptom control. ____________________________________________   FINAL CLINICAL IMPRESSION(S) / ED DIAGNOSES  Final diagnoses:  Fracture of great toe, left, closed, initial encounter      Racheal Patches, PA-C 10/24/15 2352  Darci Current, MD 10/27/15 [phone]

## 2015-10-24 NOTE — ED Notes (Addendum)
Pt presents to ED with left foot and great toe pain. Pt states she dropped a bowling ball on it yesterday afternoon around 3pm. Pt states since she is a diabetic she felt like it needed to be evaluated. Pt has swelling to left foot with no obvious deformity.  Pain increases with ambulation and movement. Pt blood pressure elevated at this time and pt reports she has been taking her medications as prescribed.

## 2015-10-24 NOTE — Discharge Instructions (Signed)
Toe Fracture °A toe fracture is a break in one of the toe bones (phalanges). °CAUSES °This condition may be caused by: °· Dropping a heavy object on your toe. °· Stubbing your toe. °· Overusing your toe or doing repetitive exercise. °· Twisting or stretching your toe out of place. °RISK FACTORS °This condition is more likely to develop in people who: °· Play contact sports. °· Have a bone disease. °· Have a low calcium level. °SYMPTOMS °The main symptoms of this condition are swelling and pain in the toe. The pain may get worse with standing or walking. Other symptoms include: °· Bruising. °· Stiffness. °· Numbness. °· A change in the way the toe looks. °· Broken bones that poke through the skin. °· Blood beneath the toenail. °DIAGNOSIS °This condition is diagnosed with a physical exam. You may also have X-rays. °TREATMENT  °Treatment for this condition depends on the type of fracture and its severity. Treatment may involve: °· Taping the broken toe to a toe that is next to it (buddy taping). This is the most common treatment for fractures in which the bone has not moved out of place (nondisplaced fracture). °· Wearing a shoe that has a wide, rigid sole to protect the toe and to limit its movement. °· Wearing a walking cast. °· Having a procedure to move the toe back into place. °· Surgery. This may be needed: °¨ If there are many pieces of broken bone that are out of place (displaced). °¨ If the toe joint breaks. °¨ If the bone breaks through the skin. °· Physical therapy. This is done to help regain movement and strength in the toe. °You may need follow-up X-rays to make sure that the bone is healing well and staying in position. °HOME CARE INSTRUCTIONS °If You Have a Cast: °· Do not stick anything inside the cast to scratch your skin. Doing that increases your risk of infection. °· Check the skin around the cast every day. Report any concerns to your health care provider. You may put lotion on dry skin around the  edges of the cast. Do not apply lotion to the skin underneath the cast. °· Do not put pressure on any part of the cast until it is fully hardened. This may take several hours. °· Keep the cast clean and dry. °Bathing °· Do not take baths, swim, or use a hot tub until your health care provider approves. Ask your health care provider if you can take showers. You may only be allowed to take sponge baths for bathing. °· If your health care provider approves bathing and showering, cover the cast or bandage (dressing) with a watertight plastic bag to protect it from water. Do not let the cast or dressing get wet. °Managing Pain, Stiffness, and Swelling °· If you do not have a cast, apply ice to the injured area, if directed. °¨ Put ice in a plastic bag. °¨ Place a towel between your skin and the bag. °¨ Leave the ice on for 20 minutes, 2-3 times per day. °· Move your toes often to avoid stiffness and to lessen swelling. °· Raise (elevate) the injured area above the level of your heart while you are sitting or lying down. °Driving °· Do not drive or operate heavy machinery while taking pain medicine. °· Do not drive while wearing a cast on a foot that you use for driving. °Activity °· Return to your normal activities as directed by your health care provider. Ask your health care   provider what activities are safe for you. °· Perform exercises daily as directed by your health care provider or physical therapist. °Safety °· Do not use the injured limb to support your body weight until your health care provider says that you can. Use crutches or other assistive devices as directed by your health care provider. °General Instructions °· If your toe was treated with buddy taping, follow your health care provider's instructions for changing the gauze and tape. Change it more often: °¨ The gauze and tape get wet. If this happens, dry the space between the toes. °¨ The gauze and tape are too tight and cause your toe to become pale  or numb. °· Wear a protective shoe as directed by your health care provider. If you were not given a protective shoe, wear sturdy, supportive shoes. Your shoes should not pinch your toes and should not fit tightly against your toes. °· Do not use any tobacco products, including cigarettes, chewing tobacco, or e-cigarettes. Tobacco can delay bone healing. If you need help quitting, ask your health care provider. °· Take medicines only as directed by your health care provider. °· Keep all follow-up visits as directed by your health care provider. This is important. °SEEK MEDICAL CARE IF: °· You have a fever. °· Your pain medicine is not helping. °· Your toe is cold. °· Your toe is numb. °· You still have pain after one week of rest and treatment. °· You still have pain after your health care provider has said that you can start walking again. °· You have pain, tingling, or numbness in your foot that is not going away. °SEEK IMMEDIATE MEDICAL CARE IF: °· You have severe pain. °· You have redness or inflammation in your toe that is getting worse. °· You have pain or numbness in your toe that is getting worse. °· Your toe turns blue. °  °This information is not intended to replace advice given to you by your health care provider. Make sure you discuss any questions you have with your health care provider. °  °Document Released: 10/25/2000 Document Revised: 07/19/2015 Document Reviewed: 08/24/2014 °[employer] Interactive Patient Education ©2016 [employer] ° °

## 2016-05-07 IMAGING — CR DG FOOT COMPLETE 3+V*L*
1 series · 3 of 3 positions shown · non-contrast
Comparison: None.

CLINICAL DATA: Left foot and left great toe pain. Patient dropped a
bowling ball onto the foot yesterday at about 3 p.m.. History of
diabetes. Swelling to the left foot. Pain increases with movement.

EXAM:
LEFT FOOT - COMPLETE 3+ VIEW

[Series 1: x foot ap left · 0.14mm/px · 3 of 3 slices shown]
[im 1/3]
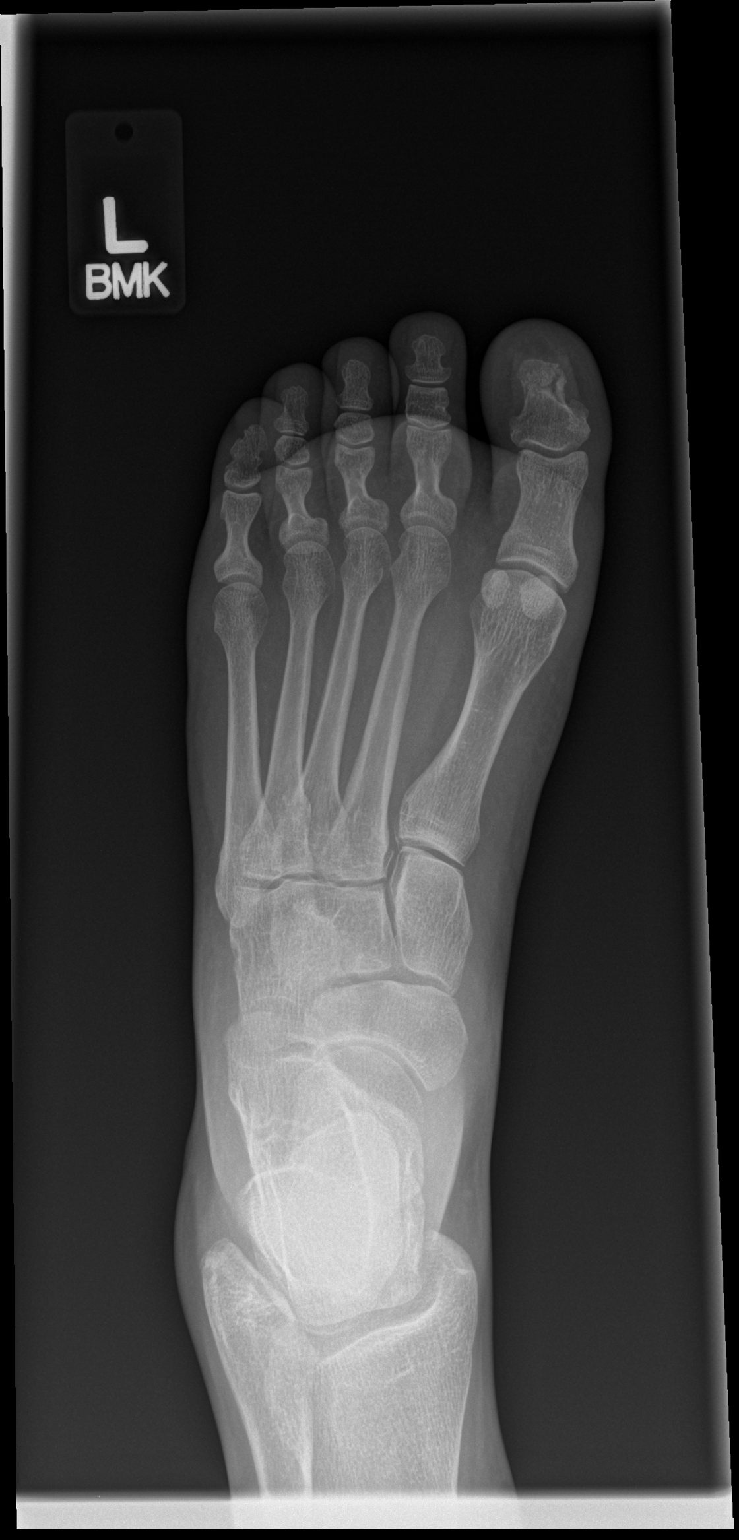
[im 2/3]
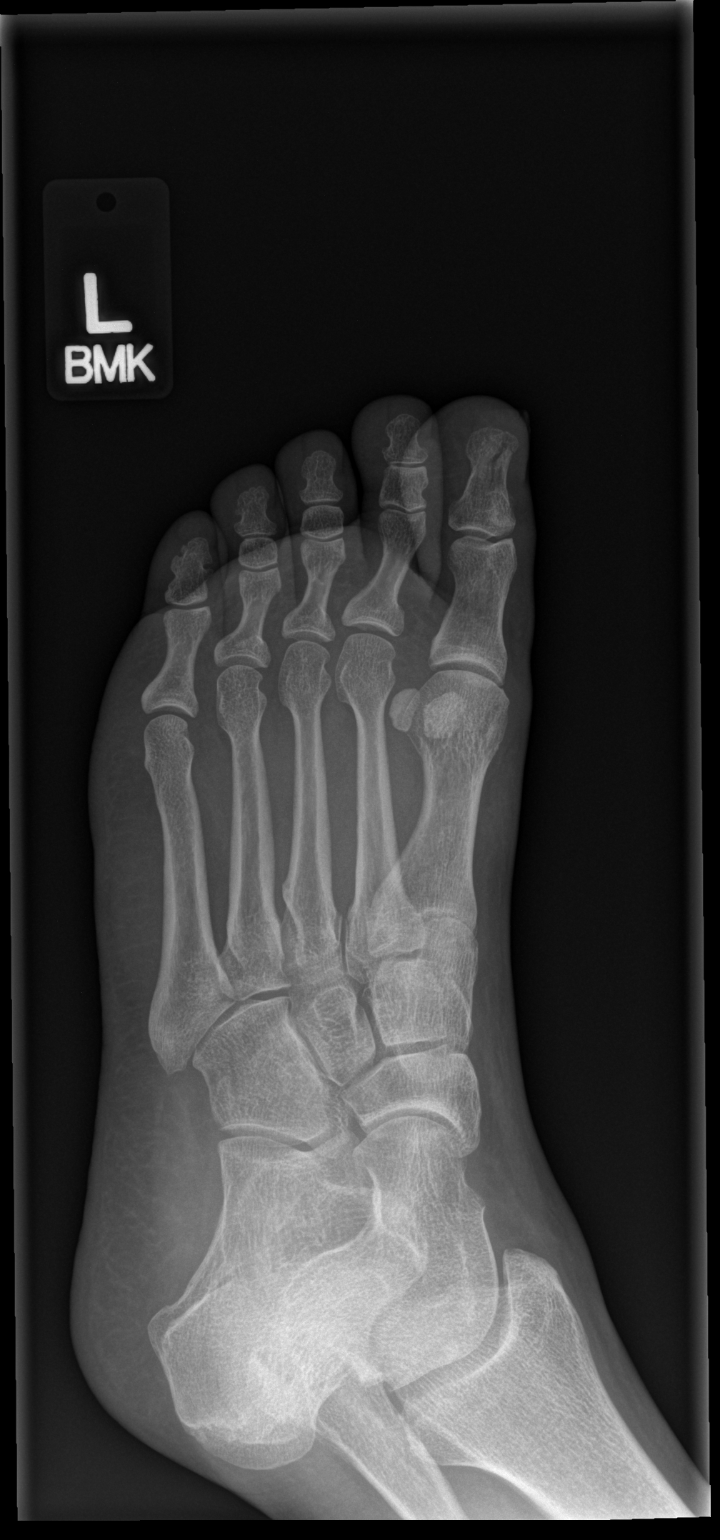
[im 3/3]
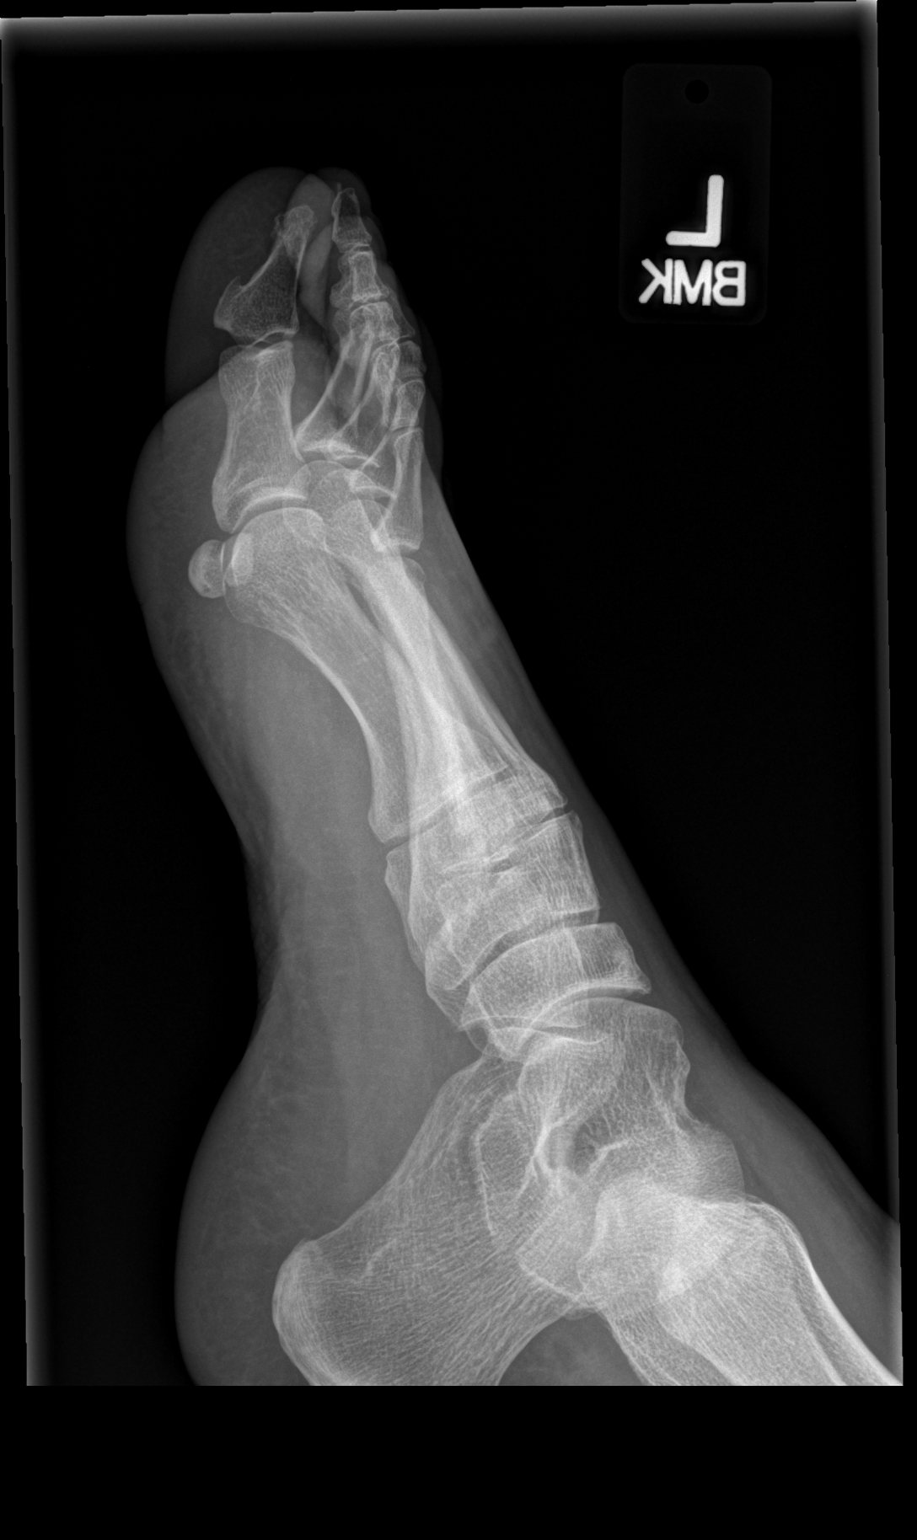

[3 of 3 positions shown; findings below may reference images not displayed]

FINDINGS: Comminuted fractures of the distal phalanx of the left first toe
extending to the distal phalangeal tuft. No significant displacement
of fracture fragments. No articular involvement. Left foot appears
otherwise intact. Soft tissues are unremarkable.
IMPRESSION: Comminuted fractures of the distal phalanx of the left first toe.

## 2016-07-30 ENCOUNTER — Inpatient Hospital Stay
Admission: EM | Admit: 2016-07-30 | Discharge: 2016-08-01 | DRG: 392 | Disposition: A | Discharge status: Home / Self Care | Payer: Medicare Other | Attending: Internal Medicine | Admitting: Internal Medicine

## 2016-07-30 ENCOUNTER — Emergency Department: Payer: Medicare Other

## 2016-07-30 DIAGNOSIS — Z7984 Long term (current) use of oral hypoglycemic drugs: Secondary | ICD-10-CM | POA: Diagnosis not present

## 2016-07-30 DIAGNOSIS — J449 Chronic obstructive pulmonary disease, unspecified: Secondary | ICD-10-CM | POA: Diagnosis present

## 2016-07-30 DIAGNOSIS — I10 Essential (primary) hypertension: Secondary | ICD-10-CM | POA: Diagnosis present

## 2016-07-30 DIAGNOSIS — Z23 Encounter for immunization: Secondary | ICD-10-CM | POA: Diagnosis not present

## 2016-07-30 DIAGNOSIS — K529 Noninfective gastroenteritis and colitis, unspecified: Secondary | ICD-10-CM | POA: Diagnosis present

## 2016-07-30 DIAGNOSIS — E119 Type 2 diabetes mellitus without complications: Secondary | ICD-10-CM | POA: Diagnosis present

## 2016-07-30 DIAGNOSIS — E039 Hypothyroidism, unspecified: Secondary | ICD-10-CM | POA: Diagnosis present

## 2016-07-30 DIAGNOSIS — K219 Gastro-esophageal reflux disease without esophagitis: Secondary | ICD-10-CM | POA: Diagnosis present

## 2016-07-30 DIAGNOSIS — Z79899 Other long term (current) drug therapy: Secondary | ICD-10-CM

## 2016-07-30 DIAGNOSIS — Z87891 Personal history of nicotine dependence: Secondary | ICD-10-CM

## 2016-07-30 DIAGNOSIS — Z833 Family history of diabetes mellitus: Secondary | ICD-10-CM

## 2016-07-30 DIAGNOSIS — G8929 Other chronic pain: Secondary | ICD-10-CM | POA: Diagnosis present

## 2016-07-30 DIAGNOSIS — M549 Dorsalgia, unspecified: Secondary | ICD-10-CM | POA: Diagnosis present

## 2016-07-30 DIAGNOSIS — G4733 Obstructive sleep apnea (adult) (pediatric): Secondary | ICD-10-CM | POA: Diagnosis present

## 2016-07-30 DIAGNOSIS — Z8249 Family history of ischemic heart disease and other diseases of the circulatory system: Secondary | ICD-10-CM

## 2016-07-30 DIAGNOSIS — K922 Gastrointestinal hemorrhage, unspecified: Secondary | ICD-10-CM

## 2016-07-30 LAB — URINALYSIS COMPLETE WITH MICROSCOPIC (ARMC ONLY)
BILIRUBIN URINE: NEGATIVE
Bacteria, UA: NONE SEEN
GLUCOSE, UA: NEGATIVE mg/dL
KETONES UR: NEGATIVE mg/dL
LEUKOCYTES UA: NEGATIVE
NITRITE: NEGATIVE
Protein, ur: NEGATIVE mg/dL
SPECIFIC GRAVITY, URINE: 1.014 (ref 1.005–1.030)
pH: 6 (ref 5.0–8.0)

## 2016-07-30 LAB — COMPREHENSIVE METABOLIC PANEL
ALT: 41 U/L (ref 14–54)
AST: 20 U/L (ref 15–41)
Albumin: 3.9 g/dL (ref 3.5–5.0)
Alkaline Phosphatase: 114 U/L (ref 38–126)
Anion gap: 7 (ref 5–15)
BILIRUBIN TOTAL: 0.6 mg/dL (ref 0.3–1.2)
BUN: 15 mg/dL (ref 6–20)
CALCIUM: 9.7 mg/dL (ref 8.9–10.3)
CO2: 26 mmol/L (ref 22–32)
CREATININE: 0.88 mg/dL (ref 0.44–1.00)
Chloride: 108 mmol/L (ref 101–111)
Glucose, Bld: 132 mg/dL — ABNORMAL HIGH (ref 65–99)
Potassium: 3.7 mmol/L (ref 3.5–5.1)
Sodium: 141 mmol/L (ref 135–145)
TOTAL PROTEIN: 7.1 g/dL (ref 6.5–8.1)

## 2016-07-30 LAB — CBC
HCT: 42.1 % (ref 35.0–47.0)
Hemoglobin: 14 g/dL (ref 12.0–16.0)
MCH: 26.2 pg (ref 26.0–34.0)
MCHC: 33.3 g/dL (ref 32.0–36.0)
MCV: 78.6 fL — ABNORMAL LOW (ref 80.0–100.0)
Platelets: 349 10*3/uL (ref 150–440)
RBC: 5.36 MIL/uL — ABNORMAL HIGH (ref 3.80–5.20)
RDW: 14.9 % — AB (ref 11.5–14.5)
WBC: 15.6 10*3/uL — AB (ref 3.6–11.0)

## 2016-07-30 LAB — HEMOGLOBIN: Hemoglobin: 12.5 g/dL (ref 12.0–16.0)

## 2016-07-30 LAB — LIPASE, BLOOD: LIPASE: 15 U/L (ref 11–51)

## 2016-07-30 MED ORDER — SODIUM CHLORIDE 0.9 % IV BOLUS (SEPSIS)
1000.0000 mL | Freq: Once | INTRAVENOUS | Status: AC
  Administered 2016-07-30: 1000 mL via INTRAVENOUS

## 2016-07-30 MED ORDER — GABAPENTIN 400 MG PO CAPS
800.0000 mg | ORAL_CAPSULE | Freq: Three times a day (TID) | ORAL | Status: DC
  Administered 2016-07-30 – 2016-08-01 (×5): 800 mg via ORAL
  Filled 2016-07-30 (×4): qty 2

## 2016-07-30 MED ORDER — METRONIDAZOLE IN NACL 5-0.79 MG/ML-% IV SOLN
500.0000 mg | Freq: Three times a day (TID) | INTRAVENOUS | Status: DC
  Administered 2016-07-31 – 2016-08-01 (×5): 500 mg via INTRAVENOUS
  Filled 2016-07-30 (×7): qty 100

## 2016-07-30 MED ORDER — ONDANSETRON HCL 4 MG PO TABS: 4.0000 mg | ORAL_TABLET | Freq: Four times a day (QID) | ORAL | Status: DC | PRN

## 2016-07-30 MED ORDER — HYDROMORPHONE HCL 1 MG/ML IJ SOLN
1.0000 mg | INTRAMUSCULAR | Status: DC | PRN
  Administered 2016-07-30 – 2016-07-31 (×3): 1 mg via INTRAVENOUS
  Filled 2016-07-30 (×3): qty 1

## 2016-07-30 MED ORDER — FENTANYL CITRATE (PF) 100 MCG/2ML IJ SOLN
25.0000 ug | Freq: Once | INTRAMUSCULAR | Status: AC
  Administered 2016-07-30: 25 ug via INTRAVENOUS
  Filled 2016-07-30: qty 2

## 2016-07-30 MED ORDER — ATORVASTATIN CALCIUM 20 MG PO TABS
20.0000 mg | ORAL_TABLET | Freq: Every day | ORAL | Status: DC
  Administered 2016-07-31 – 2016-08-01 (×2): 20 mg via ORAL
  Filled 2016-07-30 (×2): qty 1

## 2016-07-30 MED ORDER — ONDANSETRON HCL 4 MG/2ML IJ SOLN: 4.0000 mg | Freq: Four times a day (QID) | INTRAMUSCULAR | Status: DC | PRN

## 2016-07-30 MED ORDER — ZOLPIDEM TARTRATE 5 MG PO TABS: 5.0000 mg | ORAL_TABLET | Freq: Every evening | ORAL | Status: DC | PRN

## 2016-07-30 MED ORDER — TIOTROPIUM BROMIDE MONOHYDRATE 18 MCG IN CAPS
18.0000 ug | ORAL_CAPSULE | Freq: Every day | RESPIRATORY_TRACT | Status: DC
  Administered 2016-07-31 – 2016-08-01 (×2): 18 ug via RESPIRATORY_TRACT
  Filled 2016-07-30: qty 5

## 2016-07-30 MED ORDER — DULOXETINE HCL 60 MG PO CPEP
60.0000 mg | ORAL_CAPSULE | Freq: Every day | ORAL | Status: DC
  Administered 2016-07-31 – 2016-08-01 (×2): 60 mg via ORAL
  Filled 2016-07-30 (×2): qty 1

## 2016-07-30 MED ORDER — ONDANSETRON HCL 4 MG/2ML IJ SOLN
4.0000 mg | Freq: Once | INTRAMUSCULAR | Status: AC
  Administered 2016-07-30: 4 mg via INTRAVENOUS
  Filled 2016-07-30: qty 2

## 2016-07-30 MED ORDER — IOPAMIDOL (ISOVUE-370) INJECTION 76%
100.0000 mL | Freq: Once | INTRAVENOUS | Status: AC | PRN
  Administered 2016-07-30: 100 mL via INTRAVENOUS

## 2016-07-30 MED ORDER — CIPROFLOXACIN IN D5W 400 MG/200ML IV SOLN
400.0000 mg | Freq: Two times a day (BID) | INTRAVENOUS | Status: DC
  Administered 2016-07-30 – 2016-07-31 (×3): 400 mg via INTRAVENOUS
  Filled 2016-07-30 (×6): qty 200

## 2016-07-30 MED ORDER — GLIPIZIDE ER 2.5 MG PO TB24
10.0000 mg | ORAL_TABLET | Freq: Every day | ORAL | Status: DC
  Administered 2016-07-31 – 2016-08-01 (×2): 10 mg via ORAL
  Filled 2016-07-30 (×3): qty 4

## 2016-07-30 MED ORDER — FENTANYL CITRATE (PF) 100 MCG/2ML IJ SOLN
50.0000 ug | INTRAMUSCULAR | Status: AC
  Administered 2016-07-30: 50 ug via INTRAVENOUS
  Filled 2016-07-30: qty 2

## 2016-07-30 MED ORDER — HYDROCODONE-ACETAMINOPHEN 5-325 MG PO TABS
1.0000 | ORAL_TABLET | ORAL | Status: DC | PRN
  Administered 2016-07-30: 1 via ORAL
  Filled 2016-07-30: qty 1

## 2016-07-30 MED ORDER — METHOCARBAMOL 500 MG PO TABS
750.0000 mg | ORAL_TABLET | Freq: Four times a day (QID) | ORAL | Status: DC
  Administered 2016-07-30 – 2016-08-01 (×5): 750 mg via ORAL
  Filled 2016-07-30: qty 2
  Filled 2016-07-30: qty 1
  Filled 2016-07-30 (×3): qty 2

## 2016-07-30 MED ORDER — BENAZEPRIL HCL 20 MG PO TABS
10.0000 mg | ORAL_TABLET | Freq: Every day | ORAL | Status: DC
  Administered 2016-07-31: 10 mg via ORAL
  Filled 2016-07-30: qty 1

## 2016-07-30 MED ORDER — DICYCLOMINE HCL 10 MG PO CAPS
10.0000 mg | ORAL_CAPSULE | Freq: Three times a day (TID) | ORAL | Status: DC
  Administered 2016-07-31 – 2016-08-01 (×4): 10 mg via ORAL
  Filled 2016-07-30 (×5): qty 1

## 2016-07-30 MED ORDER — ARFORMOTEROL TARTRATE 15 MCG/2ML IN NEBU
15.0000 ug | INHALATION_SOLUTION | Freq: Two times a day (BID) | RESPIRATORY_TRACT | Status: DC
  Administered 2016-07-31 – 2016-08-01 (×3): 15 ug via RESPIRATORY_TRACT
  Filled 2016-07-30 (×4): qty 2

## 2016-07-30 MED ORDER — GABAPENTIN 800 MG PO TABS
800.0000 mg | ORAL_TABLET | Freq: Three times a day (TID) | ORAL | Status: DC
  Filled 2016-07-30 (×2): qty 1

## 2016-07-30 MED ORDER — PANTOPRAZOLE SODIUM 40 MG PO TBEC
40.0000 mg | DELAYED_RELEASE_TABLET | Freq: Every day | ORAL | Status: DC
Start: 2016-07-31 — End: 2016-08-01
  Administered 2016-07-31 – 2016-08-01 (×2): 40 mg via ORAL
  Filled 2016-07-30 (×2): qty 1

## 2016-07-30 MED ORDER — MAGNESIUM OXIDE 400 (241.3 MG) MG PO TABS
400.0000 mg | ORAL_TABLET | Freq: Every day | ORAL | Status: DC
  Administered 2016-07-31 – 2016-08-01 (×2): 400 mg via ORAL
  Filled 2016-07-30 (×2): qty 1

## 2016-07-30 MED ORDER — LEVOTHYROXINE SODIUM 50 MCG PO TABS
50.0000 ug | ORAL_TABLET | Freq: Every day | ORAL | Status: DC
  Administered 2016-07-31 – 2016-08-01 (×2): 50 ug via ORAL
  Filled 2016-07-30 (×2): qty 1

## 2016-07-30 MED ORDER — INFLUENZA VAC SPLIT QUAD 0.5 ML IM SUSY
0.5000 mL | PREFILLED_SYRINGE | INTRAMUSCULAR | Status: AC
  Administered 2016-07-31: 0.5 mL via INTRAMUSCULAR
  Filled 2016-07-30: qty 0.5

## 2016-07-30 MED ORDER — FENTANYL CITRATE (PF) 100 MCG/2ML IJ SOLN
25.0000 ug | INTRAMUSCULAR | Status: AC
  Administered 2016-07-30: 25 ug via INTRAVENOUS
  Filled 2016-07-30: qty 2

## 2016-07-30 MED ORDER — PNEUMOCOCCAL VAC POLYVALENT 25 MCG/0.5ML IJ INJ
0.5000 mL | INJECTION | INTRAMUSCULAR | Status: AC
  Administered 2016-07-31: 0.5 mL via INTRAMUSCULAR
  Filled 2016-07-30: qty 0.5

## 2016-07-30 NOTE — ED Provider Notes (Signed)
[facility] Emergency Department Provider Note ____________________________________________   First MD Initiated Contact with Patient 07/30/16 1122     (approximate)  I have reviewed the triage vital signs and the nursing notes.   HISTORY  Chief Complaint Abdominal Pain    HPI Kerri Steele is a 55 y.o. female previous history of COPD, hypertension, pneumonia.  Patient reports that since Friday she began having ongoing and slightly worsening abdominal pain, comes and goes sometimes a left sometimes on the right. She also reports feeling nauseated, she has vomited a couple times yesterday, and then this evening she started developing bloody stool that continued through the morning. This prompted her to come the ER, she did noticing very loose stool with some small amounts of mixed blood in it.  She denies any recent antibiotics, no recent travel history, no recent bad foods or exposures.  Reports currently moderate right-sided abdominal pain, seems to get worse before having a loose bowel movement.  Does not take any blood thinners  Past Medical History:  Diagnosis Date  . Anginal pain (HCC)   . Anxiety   . COPD (chronic obstructive pulmonary disease) (HCC)   . Depression   . Diabetes mellitus without complication (HCC)   . GERD (gastroesophageal reflux disease)   . Hypertension   . Hypothyroidism   . Pneumonia   . Shortness of breath dyspnea   . Sleep apnea     Patient Active Problem List   Diagnosis Date Noted  . Breath shortness 10/18/2012    Past Surgical History:  Procedure Laterality Date  . ANKLE SURGERY    . BLADDER SURGERY    . CARDIAC CATHETERIZATION Left 08/23/2015   Procedure: Left Heart Cath and Coronary Angiography;  Surgeon: Dalia Heading, MD;  Location: [facility] INVASIVE CV LAB;  Service: Cardiovascular;  Laterality: Left;  . ESOPHAGEAL DILATION    . TUBAL LIGATION    . WRIST SURGERY      Prior to Admission medications     Medication Sig Start Date End Date Taking? Authorizing Provider  albuterol-ipratropium (COMBIVENT) 18-103 MCG/ACT inhaler Inhale 2 puffs into the lungs every 4 (four) hours as needed for wheezing or shortness of breath.    Historical Provider, MD  arformoterol (BROVANA) 15 MCG/2ML NEBU Take 15 mcg by nebulization 2 (two) times daily.    Historical Provider, MD  atorvastatin (LIPITOR) 20 MG tablet Take 20 mg by mouth daily.    Historical Provider, MD  benazepril (LOTENSIN) 10 MG tablet Take 10 mg by mouth daily.    Historical Provider, MD  DULoxetine (CYMBALTA) 60 MG capsule Take 60 mg by mouth daily.    Historical Provider, MD  gabapentin (NEURONTIN) 800 MG tablet Take 800 mg by mouth 3 (three) times daily.    Historical Provider, MD  glipiZIDE (GLUCOTROL XL) 10 MG 24 hr tablet Take 10 mg by mouth daily with breakfast.    Historical Provider, MD  HYDROcodone-acetaminophen (NORCO/VICODIN) 5-325 MG tablet Take 1 tablet by mouth every 6 (six) hours as needed for moderate pain.    Historical Provider, MD  HYDROcodone-acetaminophen (NORCO/VICODIN) 5-325 MG tablet Take 1 tablet by mouth every 4 (four) hours as needed for moderate pain. 10/24/15   Delorise Royals Cuthriell, PA-C  levothyroxine (SYNTHROID, LEVOTHROID) 50 MCG tablet Take 50 mcg by mouth daily before breakfast.    Historical Provider, MD  magnesium oxide (MAG-OX) 400 MG tablet Take 400 mg by mouth daily.    Historical Provider, MD  methocarbamol (ROBAXIN) 750  MG tablet Take 750 mg by mouth 4 (four) times daily.    Historical Provider, MD  omeprazole (PRILOSEC) 20 MG capsule Take 20 mg by mouth daily.    Historical Provider, MD  predniSONE (STERAPRED UNI-PAK 21 TAB) 10 MG (21) TBPK tablet Take 6 tablets on day 1 Take 5 tablets on day 2 Take 4 tablets on day 3 Take 3 tablets on day 4 Take 2 tablets on day 5 Take 1 tablet on day 6 09/27/15   Cari B Triplett, FNP  tiotropium (SPIRIVA) 18 MCG inhalation capsule Place 18 mcg into inhaler and inhale  daily.    Historical Provider, MD  traMADol (ULTRAM) 50 MG tablet Take 50 mg by mouth every 6 (six) hours as needed for moderate pain.    Historical Provider, MD  zolpidem (AMBIEN) 5 MG tablet Take 5 mg by mouth at bedtime as needed for sleep.    Historical Provider, MD    Allergies Morphine and related and Sulfa antibiotics  Family History  Problem Relation Age of Onset  . Heart disease Father   . Hypertension Mother   . Diabetes Mother   . Lupus Mother   . Atrial fibrillation Mother     Social History Social History  Substance Use Topics  . Smoking status: Former Games developermoker  . Smokeless tobacco: Never Used  . Alcohol use No    Review of Systems Constitutional: Feeling warm and having some chills Eyes: No visual changes. ENT: No sore throat. Cardiovascular: Denies chest pain. Respiratory: Denies shortness of breath. Gastrointestinal:  No constipation. Genitourinary: Negative for dysuria. Musculoskeletal: Negative for back pain. Skin: Negative for rash. Neurological: Negative for headaches, focal weakness or numbness.  10-point ROS otherwise negative.  ____________________________________________   PHYSICAL EXAM:  VITAL SIGNS: ED Triage Vitals  Enc Vitals Group     BP 07/30/16 1031 129/81     Pulse Rate 07/30/16 1031 (!) 106     Resp 07/30/16 1031 18     Temp 07/30/16 1031 98.2 F (36.8 C)     Temp Source 07/30/16 1031 Oral     SpO2 07/30/16 1031 98 %     Weight 07/30/16 1032 160 lb (72.6 kg)     Height 07/30/16 1032 5\' 1"  (1.549 m)     Head Circumference --      Peak Flow --      Pain Score 07/30/16 1036 8     Pain Loc --      Pain Edu? --      Excl. in GC? --     Constitutional: Alert and oriented. Well appearing and in no acute distressBut does appear to be in pain, holding her hand over her lower abdomen where she reports pain. Eyes: Conjunctivae are normal. PERRL. EOMI. Head: Atraumatic. Nose: No congestion/rhinnorhea. Mouth/Throat: Mucous  membranes are slightly dry.  Oropharynx non-erythematous. Neck: No stridor.   Cardiovascular: Normal rate, regular rhythm. Grossly normal heart sounds.  Good peripheral circulation. Respiratory: Normal respiratory effort.  No retractions. Lungs CTAB. Gastrointestinal: Soft and moderately tender with questionable mild peritonitis in the right lower quadrant, no tenderness of the left side of the abdomen, negative Murphy. No abdominal bruits. No CVA tenderness. Patient was very loose stool, small amounts of red blood noted Musculoskeletal: No lower extremity tenderness nor edema.  No joint effusions. Neurologic:  Normal speech and language. No gross focal neurologic deficits are appreciated. Skin:  Skin is warm, dry and intact. No rash noted. Psychiatric: Mood and affect are normal. Speech  and behavior are normal.  ____________________________________________   LABS (all labs ordered are listed, but only abnormal results are displayed)  Labs Reviewed  GASTROINTESTINAL PANEL BY PCR, STOOL (REPLACES STOOL CULTURE)  C DIFFICILE QUICK SCREEN W PCR REFLEX  LIPASE, BLOOD  COMPREHENSIVE METABOLIC PANEL  CBC  URINALYSIS COMPLETEWITH MICROSCOPIC ([facility] ONLY)   ____________________________________________  EKG   ____________________________________________  RADIOLOGY  Ct Angio Abd/pel W And/or Wo Contrast  Result Date: 07/30/2016 CLINICAL DATA:  abdominal pain. Per EMS pt has had abdominal pain since Thursday as well as NVD. Today pt reports abdominal pain that radiates from epigastric to RLQ, pt also reports passing bright red loose stools since 1AM every hour. No hx of CA. Hx of tubal lig surg EXAM: CT ANGIOGRAPHY ABDOMEN AND PELVIS TECHNIQUE: Multidetector CT imaging of the abdomen and pelvis was performed using the standard protocol during bolus administration of intravenous contrast. Multiplanar reconstructed images including MIPs were obtained and reviewed to evaluate the vascular  anatomy. CONTRAST:  100 mL Isovue 370 IV COMPARISON:  COMPARISON Ultrasound 01/21/2007 FINDINGS: ARTERIAL Aorta: Moderate partially calcified plaque in the infrarenal segment. No aneurysm, dissection, or stenosis. Celiac axis: Short segment narrowing at the level of the median arcuate ligament of the diaphragm, patent distally. Superior mesenteric: Patent. Replaced right hepatic arterial supply, an anatomic variant. Left renal:           Single, patent Right renal: Single, patent, with early bifurcation into anterior and posterior divisions. Inferior mesenteric: Patent, with origin stenosis related to aortic wall plaque. Left iliac: Minimal atheromatous plaque. No aneurysm, dissection, or stenosis. Right iliac: Mild nonocclusive plaque in the common iliac artery. No aneurysm or dissection. VENOUS Patent hepatic veins, portal vein, superior mesenteric vein, splenic vein, bilateral renal veins, IVC, and iliac venous systems. Review of the MIP images confirms the above findings. NONVASCULAR Lower chest:  No acute findings. Hepatobiliary: No masses or other significant abnormality. Pancreas: No mass, inflammatory changes, or other significant abnormality. Spleen: Within normal limits in size and appearance. Adrenals/Urinary Tract: Kidneys and adrenal glands unremarkable. No hydronephrosis. Ureters decompressed. Wall thickening of the urinary bladder, incompletely distended. Stomach/Bowel: Stomach, small bowel, and colon are nondilated. Normal appendix. No evidence of mass. No focus of progressive contrast accumulation to suggest a site of GI bleeding. Lymphatic: No pathologically enlarged lymph nodes. Reproductive: No mass or other significant abnormality. Bilateral tubal ligation clips. Other: No ascites.  No free air. Musculoskeletal: Degenerative disc disease L5-S1. Negative for fracture. IMPRESSION: 1. Mild aortoiliac atheromatous change without aneurysm or stenosis. 2. No clinically significant visceral arterial  occlusive disease. 3. No focal bowel lesion. No evidence of active GI bleeding, although tagged red blood cell scintigraphy is probably a more sensitive study. Electronically Signed   By: Corlis Leak M.D.   On: 07/30/2016 13:11    ____________________________________________   PROCEDURES  Procedure(s) performed: None  Procedures  Critical Care performed: No  ____________________________________________   INITIAL IMPRESSION / ASSESSMENT AND PLAN / ED COURSE  Pertinent labs & imaging results that were available during my care of the patient were reviewed by me and considered in my medical decision making (see chart for details).  Differential diagnosis includes but is not limited to, abdominal perforation, aortic dissection, cholecystitis, appendicitis, diverticulitis, colitis, esophagitis/gastritis, kidney stone, pyelonephritis, urinary tract infection, aortic aneurysm. All are considered in decision and treatment plan. Based upon the patient's presentation and risk factors, I am concerned about colitis, versus other acute intra-abdominal etiology, and feel less likely ulcerative disease as  the patient reports she started with loose watery stools, progressing to slightly bloody stools today. Overall the picture seems questionable for infectious etiology, certainly ischemic colitis is a consideration oldCT angiogram to evaluate for acute etiology including vascular.  ----------------------------------------- 3:57 PM on 07/30/2016 -----------------------------------------  Patient has a morphine allergy, but her pain is well-controlled after receiving fentanyl be dosing. She reports that pain is most severe forearm bowel movement and she continues to have small amounts of red blood in her stool. Elevated white count, normal hemoglobin. We'll withhold anabiotic's this time, pending stool studies which the patient has been able to give the results have not yet been yielded. Suspect possible  viral versus bacterial etiology, discussed with Dr. Angelique Blonder, and patient will be admitted for ongoing evaluation and observation. Patient and her husband both agreeable with plan. Reports improvement, presently hemodynamically stable.   Clinical Course     ____________________________________________   FINAL CLINICAL IMPRESSION(S) / ED DIAGNOSES  Final diagnoses:  Acute GI bleeding  Colitis      NEW MEDICATIONS STARTED DURING THIS VISIT:  New Prescriptions   No medications on file     Note:  This document was prepared using Dragon voice recognition software and may include unintentional dictation errors.     Sharyn Creamer, MD 07/30/16 [phone]

## 2016-07-30 NOTE — ED Triage Notes (Signed)
Pt to ED from home via [facility] c/o abdominal pain. Per EMS pt has had abdominal pain since Thursday as well as NVD. Today pt reports abdominal pain that radiates from epigastric to RLQ, pt also reports passing bright red loose stools since 1AM every hour. Pt alert and oriented in no acute distress at this time.

## 2016-07-30 NOTE — H&P (Addendum)
[facility] [place] at [facility]   PATIENT NAME: Kerri HugueninJanet Randal    MR#:  [id number]  DATE OF BIRTH:  06/09/1961  DATE OF ADMISSION:  07/30/2016  PRIMARY CARE PHYSICIAN: [name], Lindsey F, NP   REQUESTING/REFERRING PHYSICIAN: Dr. Sharyn CreamerMark Quale  CHIEF COMPLAINT:   Chief Complaint  Patient presents with  . Abdominal Pain    HISTORY OF PRESENT ILLNESS:  Kerri Steele  is a 55 y.o. female with a known history of COPD on 2l nocturnal o2, DDD, NIDDM, GERD, HTN, OSA, anxiety and chronic back pain presents secondary to nausea, vomiting and bloody diarrhea with abdominal pain. Symptoms started with nausea, vomiting 5 days ago, improved, decreased appetite, last night started feeling sick again and since then having watery diarrhea, more bloody now. No recent travel, did not eat outside, no sick contacts. Also nauseated and vomiting since last night. Having cramping abdominal pain. No fevers or chills CT abd here in ER without any acute findings, but exam consistent with colitis Elevated wbc noted as well.  PAST MEDICAL HISTORY:   Past Medical History:  Diagnosis Date  . Anginal pain (HCC)   . Anxiety   . COPD (chronic obstructive pulmonary disease) (HCC)   . Depression   . Diabetes mellitus without complication (HCC)   . GERD (gastroesophageal reflux disease)   . Hypertension   . Hypothyroidism   . Pneumonia   . Shortness of breath dyspnea   . Sleep apnea     PAST SURGICAL HISTORY:   Past Surgical History:  Procedure Laterality Date  . ANKLE SURGERY    . BLADDER SURGERY    . CARDIAC CATHETERIZATION Left 08/23/2015   Procedure: Left Heart Cath and Coronary Angiography;  Surgeon: Dalia HeadingKenneth A Fath, MD;  Location: [facility] INVASIVE CV LAB;  Service: Cardiovascular;  Laterality: Left;  . ESOPHAGEAL DILATION    . TUBAL LIGATION    . WRIST SURGERY      SOCIAL HISTORY:   Social History  Substance Use Topics  . Smoking status: Former Games developermoker  . Smokeless tobacco:  Never Used  . Alcohol use No    FAMILY HISTORY:   Family History  Problem Relation Age of Onset  . Heart disease Father   . Hypertension Mother   . Diabetes Mother   . Lupus Mother   . Atrial fibrillation Mother     DRUG ALLERGIES:   Allergies  Allergen Reactions  . Metformin And Related Other (See Comments)    migraine  . Morphine And Related Hives  . Sulfa Antibiotics Other (See Comments)    REVIEW OF SYSTEMS:   Review of Systems  Constitutional: Positive for malaise/fatigue and weight loss. Negative for chills and fever.  HENT: Negative for ear discharge, ear pain, hearing loss, nosebleeds and tinnitus.   Eyes: Negative for blurred vision, double vision and photophobia.  Respiratory: Negative for cough, hemoptysis, shortness of breath and wheezing.   Cardiovascular: Negative for chest pain, palpitations, orthopnea and leg swelling.  Gastrointestinal: Positive for abdominal pain, blood in stool, diarrhea, nausea and vomiting. Negative for constipation, heartburn and melena.  Genitourinary: Negative for dysuria, frequency, hematuria and urgency.  Musculoskeletal: Negative for back pain, myalgias and neck pain.  Skin: Negative for rash.  Neurological: Negative for dizziness, tingling, tremors, sensory change, speech change, focal weakness and headaches.  Endo/Heme/Allergies: Does not bruise/bleed easily.  Psychiatric/Behavioral: Negative for depression.    MEDICATIONS AT HOME:   Prior to Admission medications   Medication Sig Start Date End  Date Taking? Authorizing Provider  albuterol-ipratropium (COMBIVENT) 18-103 MCG/ACT inhaler Inhale 2 puffs into the lungs every 4 (four) hours as needed for wheezing or shortness of breath.    Historical Provider, MD  arformoterol (BROVANA) 15 MCG/2ML NEBU Take 15 mcg by nebulization 2 (two) times daily.    Historical Provider, MD  atorvastatin (LIPITOR) 20 MG tablet Take 20 mg by mouth daily.    Historical Provider, MD  benazepril  (LOTENSIN) 10 MG tablet Take 10 mg by mouth daily.    Historical Provider, MD  DULoxetine (CYMBALTA) 60 MG capsule Take 60 mg by mouth daily.    Historical Provider, MD  gabapentin (NEURONTIN) 800 MG tablet Take 800 mg by mouth 3 (three) times daily.    Historical Provider, MD  glipiZIDE (GLUCOTROL XL) 10 MG 24 hr tablet Take 10 mg by mouth daily with breakfast.    Historical Provider, MD  HYDROcodone-acetaminophen (NORCO/VICODIN) 5-325 MG tablet Take 1 tablet by mouth every 6 (six) hours as needed for moderate pain.    Historical Provider, MD  HYDROcodone-acetaminophen (NORCO/VICODIN) 5-325 MG tablet Take 1 tablet by mouth every 4 (four) hours as needed for moderate pain. 10/24/15   Delorise Royals Cuthriell, PA-C  levothyroxine (SYNTHROID, LEVOTHROID) 50 MCG tablet Take 50 mcg by mouth daily before breakfast.    Historical Provider, MD  magnesium oxide (MAG-OX) 400 MG tablet Take 400 mg by mouth daily.    Historical Provider, MD  methocarbamol (ROBAXIN) 750 MG tablet Take 750 mg by mouth 4 (four) times daily.    Historical Provider, MD  omeprazole (PRILOSEC) 20 MG capsule Take 20 mg by mouth daily.    Historical Provider, MD  predniSONE (STERAPRED UNI-PAK 21 TAB) 10 MG (21) TBPK tablet Take 6 tablets on day 1 Take 5 tablets on day 2 Take 4 tablets on day 3 Take 3 tablets on day 4 Take 2 tablets on day 5 Take 1 tablet on day 6 09/27/15   Cari B Triplett, FNP  tiotropium (SPIRIVA) 18 MCG inhalation capsule Place 18 mcg into inhaler and inhale daily.    Historical Provider, MD  traMADol (ULTRAM) 50 MG tablet Take 50 mg by mouth every 6 (six) hours as needed for moderate pain.    Historical Provider, MD  zolpidem (AMBIEN) 5 MG tablet Take 5 mg by mouth at bedtime as needed for sleep.    Historical Provider, MD      VITAL SIGNS:  Blood pressure 131/75, pulse 94, temperature 98.2 F (36.8 C), temperature source Oral, resp. rate 17, height 5\' 1"  (1.549 m), weight 72.6 kg (160 lb), SpO2 100  %.  PHYSICAL EXAMINATION:   Physical Exam  GENERAL:  55 y.o.-year-old patient lying in the bed with no acute distress.  EYES: Pupils equal, round, reactive to light and accommodation. No scleral icterus. Extraocular muscles intact.  HEENT: Head atraumatic, normocephalic. Oropharynx and nasopharynx clear.  NECK:  Supple, no jugular venous distention. No thyroid enlargement, no tenderness.  LUNGS: Normal breath sounds bilaterally, no wheezing, rales,rhonchi or crepitation. No use of accessory muscles of respiration.  CARDIOVASCULAR: S1, S2 normal. No murmurs, rubs, or gallops.  ABDOMEN: Soft, tender in RLQ, no guarding or rigidity, nondistended. Bowel sounds present. No organomegaly or mass.  EXTREMITIES: No pedal edema, cyanosis, or clubbing.  NEUROLOGIC: Cranial nerves II through XII are intact. Muscle strength 5/5 in all extremities. Sensation intact. Gait not checked.  PSYCHIATRIC: The patient is alert and oriented x 3.  SKIN: No obvious rash, lesion, or ulcer.  LABORATORY PANEL:   CBC No results for input(s): WBC, HGB, HCT, PLT in the last 168 hours. ------------------------------------------------------------------------------------------------------------------  Chemistries  No results for input(s): NA, K, CL, CO2, GLUCOSE, BUN, CREATININE, CALCIUM, MG, AST, ALT, ALKPHOS, BILITOT in the last 168 hours.  Invalid input(s): GFRCGP ------------------------------------------------------------------------------------------------------------------  Cardiac Enzymes No results for input(s): TROPONINI in the last 168 hours. ------------------------------------------------------------------------------------------------------------------  RADIOLOGY:  Ct Angio Abd/pel W And/or Wo Contrast  Result Date: 07/30/2016 CLINICAL DATA:  abdominal pain. Per EMS pt has had abdominal pain since Thursday as well as NVD. Today pt reports abdominal pain that radiates from epigastric to RLQ, pt also  reports passing bright red loose stools since 1AM every hour. No hx of CA. Hx of tubal lig surg EXAM: CT ANGIOGRAPHY ABDOMEN AND PELVIS TECHNIQUE: Multidetector CT imaging of the abdomen and pelvis was performed using the standard protocol during bolus administration of intravenous contrast. Multiplanar reconstructed images including MIPs were obtained and reviewed to evaluate the vascular anatomy. CONTRAST:  100 mL Isovue 370 IV COMPARISON:  COMPARISON Ultrasound 01/21/2007 FINDINGS: ARTERIAL Aorta: Moderate partially calcified plaque in the infrarenal segment. No aneurysm, dissection, or stenosis. Celiac axis: Short segment narrowing at the level of the median arcuate ligament of the diaphragm, patent distally. Superior mesenteric: Patent. Replaced right hepatic arterial supply, an anatomic variant. Left renal:           Single, patent Right renal: Single, patent, with early bifurcation into anterior and posterior divisions. Inferior mesenteric: Patent, with origin stenosis related to aortic wall plaque. Left iliac: Minimal atheromatous plaque. No aneurysm, dissection, or stenosis. Right iliac: Mild nonocclusive plaque in the common iliac artery. No aneurysm or dissection. VENOUS Patent hepatic veins, portal vein, superior mesenteric vein, splenic vein, bilateral renal veins, IVC, and iliac venous systems. Review of the MIP images confirms the above findings. NONVASCULAR Lower chest:  No acute findings. Hepatobiliary: No masses or other significant abnormality. Pancreas: No mass, inflammatory changes, or other significant abnormality. Spleen: Within normal limits in size and appearance. Adrenals/Urinary Tract: Kidneys and adrenal glands unremarkable. No hydronephrosis. Ureters decompressed. Wall thickening of the urinary bladder, incompletely distended. Stomach/Bowel: Stomach, small bowel, and colon are nondilated. Normal appendix. No evidence of mass. No focus of progressive contrast accumulation to suggest a  site of GI bleeding. Lymphatic: No pathologically enlarged lymph nodes. Reproductive: No mass or other significant abnormality. Bilateral tubal ligation clips. Other: No ascites.  No free air. Musculoskeletal: Degenerative disc disease L5-S1. Negative for fracture. IMPRESSION: 1. Mild aortoiliac atheromatous change without aneurysm or stenosis. 2. No clinically significant visceral arterial occlusive disease. 3. No focal bowel lesion. No evidence of active GI bleeding, although tagged red blood cell scintigraphy is probably a more sensitive study. Electronically Signed   By: Corlis Leak M.D.   On: 07/30/2016 13:11    EKG:   Orders placed or performed in visit on 01/08/07  . EKG 12-Lead    IMPRESSION AND PLAN:   Elmer Boutelle  is a 55 y.o. female with a known history of COPD on 2l nocturnal o2, DDD, NIDDM, GERD, HTN, OSA, anxiety and chronic back pain presents secondary to nausea, vomiting and bloody diarrhea with abdominal pain.  #1 Acute colitis- IV fluids, pain meds - liquid diet, H&H q8h - cipro and flagyl - stool studies ordered  #2 chronic back pain- follows with physiotherapist, cont home pain meds  #3 DM- glipizide, SSI  #4 HTN- benazepril  #5 GERD- PPI  #6 DVT Prophylaxis- TEds and SCDs, hold heparin  products with bloody stools   All the records are reviewed and case discussed with ED provider. Management plans discussed with the patient, family and they are in agreement.  CODE STATUS: Full Code  TOTAL TIME TAKING CARE OF THIS PATIENT: 50 minutes.    Enid Baas M.D on 07/30/2016 at 4:20 PM  Between 7am to 6pm - Pager - [phone]  After 6pm go to [email or web address] - Social research officer, government  [facility]  Office  [phone]  CC: Primary care physician; Erasmo Downer, NP

## 2016-07-30 NOTE — ED Notes (Signed)
Patient transported to CT 

## 2016-07-31 LAB — GASTROINTESTINAL PANEL BY PCR, STOOL (REPLACES STOOL CULTURE)
ADENOVIRUS F40/41: NOT DETECTED
Astrovirus: NOT DETECTED
CAMPYLOBACTER SPECIES: NOT DETECTED
CRYPTOSPORIDIUM: NOT DETECTED
CYCLOSPORA CAYETANENSIS: NOT DETECTED
Entamoeba histolytica: NOT DETECTED
Enteroaggregative E coli (EAEC): NOT DETECTED
Enteropathogenic E coli (EPEC): NOT DETECTED
Enterotoxigenic E coli (ETEC): NOT DETECTED
Giardia lamblia: NOT DETECTED
Norovirus GI/GII: NOT DETECTED
PLESIMONAS SHIGELLOIDES: NOT DETECTED
Rotavirus A: NOT DETECTED
SALMONELLA SPECIES: NOT DETECTED
SAPOVIRUS (I, II, IV, AND V): NOT DETECTED
SHIGELLA/ENTEROINVASIVE E COLI (EIEC): NOT DETECTED
Shiga like toxin producing E coli (STEC): NOT DETECTED
VIBRIO SPECIES: NOT DETECTED
Vibrio cholerae: NOT DETECTED
YERSINIA ENTEROCOLITICA: NOT DETECTED

## 2016-07-31 LAB — BASIC METABOLIC PANEL
ANION GAP: 8 (ref 5–15)
BUN: 13 mg/dL (ref 6–20)
CALCIUM: 9.2 mg/dL (ref 8.9–10.3)
CO2: 26 mmol/L (ref 22–32)
Chloride: 107 mmol/L (ref 101–111)
Creatinine, Ser: 0.84 mg/dL (ref 0.44–1.00)
Glucose, Bld: 111 mg/dL — ABNORMAL HIGH (ref 65–99)
POTASSIUM: 3.7 mmol/L (ref 3.5–5.1)
SODIUM: 141 mmol/L (ref 135–145)

## 2016-07-31 LAB — CBC
HEMATOCRIT: 36.9 % (ref 35.0–47.0)
Hemoglobin: 12.4 g/dL (ref 12.0–16.0)
MCH: 26.6 pg (ref 26.0–34.0)
MCHC: 33.6 g/dL (ref 32.0–36.0)
MCV: 79.3 fL — AB (ref 80.0–100.0)
Platelets: 303 10*3/uL (ref 150–440)
RBC: 4.65 MIL/uL (ref 3.80–5.20)
RDW: 15.1 % — AB (ref 11.5–14.5)
WBC: 11.4 10*3/uL — ABNORMAL HIGH (ref 3.6–11.0)

## 2016-07-31 LAB — HEMOGLOBIN
Hemoglobin: 13.2 g/dL (ref 12.0–16.0)
Hemoglobin: 12.5 g/dL (ref 12.0–16.0)

## 2016-07-31 LAB — C DIFFICILE QUICK SCREEN W PCR REFLEX
C DIFFICILE (CDIFF) INTERP: NOT DETECTED
C Diff antigen: NEGATIVE
C Diff toxin: NEGATIVE

## 2016-07-31 MED ORDER — HYDROCODONE-ACETAMINOPHEN 5-325 MG PO TABS: 1.0000 | ORAL_TABLET | Freq: Four times a day (QID) | ORAL | Status: DC | PRN

## 2016-07-31 MED ORDER — HYDROCODONE-ACETAMINOPHEN 5-325 MG PO TABS
1.0000 | ORAL_TABLET | Freq: Four times a day (QID) | ORAL | Status: DC | PRN
  Administered 2016-07-31: 1 via ORAL
  Filled 2016-07-31: qty 1

## 2016-07-31 NOTE — Progress Notes (Signed)
[facility] - [place] at [facility]   PATIENT NAME: Kerri HugueninJanet Steele    MR#:  [id number]  DATE OF BIRTH:  January 04, 1961  SUBJECTIVE:  CHIEF COMPLAINT:   Chief Complaint  Patient presents with  . Abdominal Pain  feels better, no more n/v/diarrhea. abd pain 3/10 at this time. REVIEW OF SYSTEMS:  Review of Systems  Constitutional: Negative for chills, fever and weight loss.  HENT: Negative for nosebleeds and sore throat.   Eyes: Negative for blurred vision.  Respiratory: Negative for cough, shortness of breath and wheezing.   Cardiovascular: Negative for chest pain, orthopnea, leg swelling and PND.  Gastrointestinal: Positive for abdominal pain. Negative for constipation, diarrhea, heartburn, nausea and vomiting.  Genitourinary: Negative for dysuria and urgency.  Musculoskeletal: Negative for back pain.  Skin: Negative for rash.  Neurological: Negative for dizziness, speech change, focal weakness and headaches.  Endo/Heme/Allergies: Does not bruise/bleed easily.  Psychiatric/Behavioral: Negative for depression.    DRUG ALLERGIES:   Allergies  Allergen Reactions  . Metformin And Related Other (See Comments)    migraine  . Morphine And Related Hives  . Sulfa Antibiotics Other (See Comments)   VITALS:  Blood pressure (!) 118/58, pulse 79, temperature 98.1 F (36.7 C), temperature source Oral, resp. rate 18, height 5\' 1"  (1.549 m), weight 72.6 kg (160 lb), SpO2 98 %. PHYSICAL EXAMINATION:  Physical Exam  Constitutional: She is oriented to person, place, and time and well-developed, well-nourished, and in no distress.  HENT:  Head: Normocephalic and atraumatic.  Eyes: Conjunctivae and EOM are normal. Pupils are equal, round, and reactive to light.  Neck: Normal range of motion. Neck supple. No tracheal deviation present. No thyromegaly present.  Cardiovascular: Normal rate, regular rhythm and normal heart sounds.   Pulmonary/Chest: Effort normal and breath sounds  normal. No respiratory distress. She has no wheezes. She exhibits no tenderness.  Abdominal: Soft. Bowel sounds are normal. She exhibits no distension. There is tenderness in the suprapubic area.  Musculoskeletal: Normal range of motion.  Neurological: She is alert and oriented to person, place, and time. No cranial nerve deficit.  Skin: Skin is warm and dry. No rash noted.  Psychiatric: Mood and affect normal.   LABORATORY PANEL:   CBC  Recent Labs Lab 07/31/16 0451  WBC 11.4*  HGB 12.4  HCT 36.9  PLT 303   ------------------------------------------------------------------------------------------------------------------ Chemistries   Recent Labs Lab 07/30/16 1012 07/31/16 0451  NA 141 141  K 3.7 3.7  CL 108 107  CO2 26 26  GLUCOSE 132* 111*  BUN 15 13  CREATININE 0.88 0.84  CALCIUM 9.7 9.2  AST 20  --   ALT 41  --   ALKPHOS 114  --   BILITOT 0.6  --    RADIOLOGY:  Ct Angio Abd/pel W And/or Wo Contrast  Result Date: 07/30/2016 CLINICAL DATA:  abdominal pain. Per EMS pt has had abdominal pain since Thursday as well as NVD. Today pt reports abdominal pain that radiates from epigastric to RLQ, pt also reports passing bright red loose stools since 1AM every hour. No hx of CA. Hx of tubal lig surg EXAM: CT ANGIOGRAPHY ABDOMEN AND PELVIS TECHNIQUE: Multidetector CT imaging of the abdomen and pelvis was performed using the standard protocol during bolus administration of intravenous contrast. Multiplanar reconstructed images including MIPs were obtained and reviewed to evaluate the vascular anatomy. CONTRAST:  100 mL Isovue 370 IV COMPARISON:  COMPARISON Ultrasound 01/21/2007 FINDINGS: ARTERIAL Aorta: Moderate partially calcified plaque in the  infrarenal segment. No aneurysm, dissection, or stenosis. Celiac axis: Short segment narrowing at the level of the median arcuate ligament of the diaphragm, patent distally. Superior mesenteric: Patent. Replaced right hepatic arterial supply,  an anatomic variant. Left renal:           Single, patent Right renal: Single, patent, with early bifurcation into anterior and posterior divisions. Inferior mesenteric: Patent, with origin stenosis related to aortic wall plaque. Left iliac: Minimal atheromatous plaque. No aneurysm, dissection, or stenosis. Right iliac: Mild nonocclusive plaque in the common iliac artery. No aneurysm or dissection. VENOUS Patent hepatic veins, portal vein, superior mesenteric vein, splenic vein, bilateral renal veins, IVC, and iliac venous systems. Review of the MIP images confirms the above findings. NONVASCULAR Lower chest:  No acute findings. Hepatobiliary: No masses or other significant abnormality. Pancreas: No mass, inflammatory changes, or other significant abnormality. Spleen: Within normal limits in size and appearance. Adrenals/Urinary Tract: Kidneys and adrenal glands unremarkable. No hydronephrosis. Ureters decompressed. Wall thickening of the urinary bladder, incompletely distended. Stomach/Bowel: Stomach, small bowel, and colon are nondilated. Normal appendix. No evidence of mass. No focus of progressive contrast accumulation to suggest a site of GI bleeding. Lymphatic: No pathologically enlarged lymph nodes. Reproductive: No mass or other significant abnormality. Bilateral tubal ligation clips. Other: No ascites.  No free air. Musculoskeletal: Degenerative disc disease L5-S1. Negative for fracture. IMPRESSION: 1. Mild aortoiliac atheromatous change without aneurysm or stenosis. 2. No clinically significant visceral arterial occlusive disease. 3. No focal bowel lesion. No evidence of active GI bleeding, although tagged red blood cell scintigraphy is probably a more sensitive study. Electronically Signed   By: Corlis Leak M.D.   On: 07/30/2016 13:11   ASSESSMENT AND PLAN:  Kerri Steele  is a 55 y.o. female with a known history of COPD on 2l nocturnal o2, DDD, NIDDM, GERD, HTN, OSA, anxiety and chronic back pain  presents secondary to nausea, vomiting and bloody diarrhea with abdominal pain.  #1 Acute colitis- pain meds prn, stop IV dilaudid - tolerated liquids, will advance to regular - continue IV cipro and flagyl - stool studies neg - no more n/v/diarrhea  #2 chronic back pain- follows with physiotherapist, cont home pain meds  #3 DM- glipizide, SSI  #4 HTN- benazepril  #5 GERD- PPI  #6 DVT Prophylaxis- TEds and SCDs, hold heparin products with bloody stools   Discontinue tele, ambulate and advance diet.  All the records are reviewed and case discussed with Care Management/Social Worker. Management plans discussed with the patient, family and Nursing and they are in agreement.  CODE STATUS: FULL CODE  TOTAL TIME TAKING CARE OF THIS PATIENT: 25 minutes.   More than 50% of the time was spent in counseling/coordination of care: YES  POSSIBLE D/C IN 1 DAYS, DEPENDING ON CLINICAL CONDITION.   [facility], Hally Colella M.D on 07/31/2016 at 7:23 AM  Between 7am to 6pm - Pager - [phone]  After 6pm go to [email or web address] - Social research officer, government  [facility]  Office  [phone]  CC: Primary care physician; Erasmo Downer, NP  Note: This dictation was prepared with Dragon dictation along with smaller phrase technology. Any transcriptional errors that result from this process are unintentional.

## 2016-07-31 NOTE — Progress Notes (Signed)
Initial Nutrition Assessment  DOCUMENTATION CODES:   Not applicable  INTERVENTION:  -Pt cannot consume ONS at this time, still having significant paint with liquid intake  NUTRITION DIAGNOSIS:   Inadequate oral intake related to poor appetite as evidenced by per patient/family report.  GOAL:   Patient will meet greater than or equal to 90% of their needs  MONITOR:   PO intake, Labs, I & O's, Skin, Weight trends  REASON FOR ASSESSMENT:   Malnutrition Screening Tool    ASSESSMENT:   Kerri Steele  is a 55 y.o. female with a known history of COPD on 2l nocturnal o2, DDD, NIDDM, GERD, HTN, OSA, anxiety and chronic back pain presents secondary to nausea, vomiting and bloody diarrhea with abdominal pain  Spoke with Ms. Annabell HowellsWrenn, husband at bedside. She endorses poor PO intake for the past 3 days with lots of diarrhea, nausea, and some vomiting since Friday. No N/V/D today. Consumed some oatmeal this morning which she states she tolerated ok. Denies wt loss at this point.  She still has pain with liquid consumption, not appropriate to provide ONS until this resolves. Nutrition-Focused physical exam completed. Findings are no fat depletion, mild muscle depletion, and no edema.  Labs and medications reviewed.  Diet Order:  Diet regular Room service appropriate? Yes; Fluid consistency: Thin  Skin:  Reviewed, no issues  Last BM:  07/30/2016  Height:   Ht Readings from Last 1 Encounters:  07/30/16 5\' 1"  (1.549 m)    Weight:   Wt Readings from Last 1 Encounters:  07/30/16 160 lb (72.6 kg)    Ideal Body Weight:  47.72 kg  BMI:  Body mass index is 30.23 kg/m.  Estimated Nutritional Needs:   Kcal:  1350-1600 calories  Protein:  50-60 gm  Fluid:  >/= 1.3L  EDUCATION NEEDS:   No education needs identified at this time  Dionne AnoWilliam M. Ishia Tenorio, MS, RD LDN Inpatient Clinical Dietitian Pager [phone]

## 2016-08-01 LAB — BASIC METABOLIC PANEL
Anion gap: 5 (ref 5–15)
BUN: 15 mg/dL (ref 6–20)
CHLORIDE: 108 mmol/L (ref 101–111)
CO2: 30 mmol/L (ref 22–32)
CREATININE: 0.9 mg/dL (ref 0.44–1.00)
Calcium: 9.3 mg/dL (ref 8.9–10.3)
GFR calc Af Amer: >60 mL/min (ref >60)
GFR calc non Af Amer: >60 mL/min (ref >60)
GLUCOSE: 120 mg/dL — AB (ref 65–99)
POTASSIUM: 3.5 mmol/L (ref 3.5–5.1)
Sodium: 143 mmol/L (ref 135–145)

## 2016-08-01 LAB — CBC
HCT: 38.8 % (ref 35.0–47.0)
HEMOGLOBIN: 12.8 g/dL (ref 12.0–16.0)
MCH: 26.2 pg (ref 26.0–34.0)
MCHC: 32.9 g/dL (ref 32.0–36.0)
MCV: 79.6 fL — AB (ref 80.0–100.0)
PLATELETS: 303 10*3/uL (ref 150–440)
RBC: 4.88 MIL/uL (ref 3.80–5.20)
RDW: 15.1 % — ABNORMAL HIGH (ref 11.5–14.5)
WBC: 9.4 10*3/uL (ref 3.6–11.0)

## 2016-08-01 MED ORDER — METRONIDAZOLE 250 MG PO TABS: 250.0000 mg | ORAL_TABLET | Freq: Three times a day (TID) | ORAL | 0 refills | Status: DC

## 2016-08-01 MED ORDER — CIPROFLOXACIN HCL 250 MG PO TABS: 250.0000 mg | ORAL_TABLET | Freq: Two times a day (BID) | ORAL | 0 refills | Status: DC

## 2016-08-01 NOTE — Progress Notes (Signed)
Pt being discharged home today. PIV removed. Discharge instructions were reviewed with pt, all questions answered. Prescriptions were faxed to pt pharmacy for pickup. VSS, pt c/o no pain at this time. She is leaving with all her belongings. Will be transported home via family member.

## 2016-08-01 NOTE — Discharge Summary (Signed)
[facility] -  at [facility]   PATIENT NAME: Kerri Steele    MR#:  [id number]  DATE OF BIRTH:  08-31-1961  DATE OF ADMISSION:  07/30/2016   ADMITTING PHYSICIAN: Enid Baas, MD  DATE OF DISCHARGE: 08/01/2016 11:10 AM  PRIMARY CARE PHYSICIAN: Erasmo Downer, NP   ADMISSION DIAGNOSIS:  Colitis [K52.9] Acute GI bleeding [K92.2] DISCHARGE DIAGNOSIS:  Active Problems:   Acute colitis  SECONDARY DIAGNOSIS:   Past Medical History:  Diagnosis Date  . Anginal pain (HCC)   . Anxiety   . COPD (chronic obstructive pulmonary disease) (HCC)   . Depression   . Diabetes mellitus without complication (HCC)   . GERD (gastroesophageal reflux disease)   . Hypertension   . Hypothyroidism   . Pneumonia   . Shortness of breath dyspnea   . Sleep apnea    HOSPITAL COURSE:  [name] a 55 y.o. femalewith a known history of COPD on 2l nocturnal o2, DDD, NIDDM, GERD, HTN, OSA, anxiety and chronic back pain admitted secondary to nausea, vomiting and bloody diarrhea with abdominal pain.  #1 Acute colitis- more of clinical diagnosis. CT neg for colitis. May be very mild - improved on cipro and flagyl  - stool studies neg - no more n/v/diarrhea - tolerated diet and wanting to go home. DISCHARGE CONDITIONS:  stable CONSULTS OBTAINED:   DRUG ALLERGIES:   Allergies  Allergen Reactions  . Metformin And Related Other (See Comments)    migraine  . Morphine And Related Hives  . Sulfa Antibiotics Other (See Comments)   DISCHARGE MEDICATIONS:     Medication List    STOP taking these medications   benazepril 10 MG tablet Commonly known as:  LOTENSIN     TAKE these medications   albuterol-ipratropium 18-103 MCG/ACT inhaler Commonly known as:  COMBIVENT Inhale 2 puffs into the lungs every 4 (four) hours as needed for wheezing or shortness of breath.   arformoterol 15 MCG/2ML Nebu Commonly known as:  BROVANA Take 15 mcg by nebulization 2 (two)  times daily.   atorvastatin 20 MG tablet Commonly known as:  LIPITOR Take 20 mg by mouth daily.   ciprofloxacin 250 MG tablet Commonly known as:  CIPRO Take 1 tablet (250 mg total) by mouth 2 (two) times daily.   DULoxetine 60 MG capsule Commonly known as:  CYMBALTA Take 60 mg by mouth daily.   gabapentin 800 MG tablet Commonly known as:  NEURONTIN Take 800 mg by mouth 3 (three) times daily.   glipiZIDE 10 MG 24 hr tablet Commonly known as:  GLUCOTROL XL Take 10 mg by mouth daily with breakfast.   HYDROcodone-acetaminophen 5-325 MG tablet Commonly known as:  NORCO/VICODIN Take 1 tablet by mouth every 6 (six) hours as needed for moderate pain.   HYDROcodone-acetaminophen 5-325 MG tablet Commonly known as:  NORCO/VICODIN Take 1 tablet by mouth every 4 (four) hours as needed for moderate pain.   levothyroxine 50 MCG tablet Commonly known as:  SYNTHROID, LEVOTHROID Take 50 mcg by mouth daily before breakfast.   magnesium oxide 400 MG tablet Commonly known as:  MAG-OX Take 400 mg by mouth daily.   methocarbamol 750 MG tablet Commonly known as:  ROBAXIN Take 750 mg by mouth 4 (four) times daily.   metroNIDAZOLE 250 MG tablet Commonly known as:  FLAGYL Take 1 tablet (250 mg total) by mouth 3 (three) times daily.   omeprazole 20 MG capsule Commonly known as:  PRILOSEC Take 20 mg by mouth  daily.   tiotropium 18 MCG inhalation capsule Commonly known as:  SPIRIVA Place 18 mcg into inhaler and inhale daily.   traMADol 50 MG tablet Commonly known as:  ULTRAM Take 50 mg by mouth every 6 (six) hours as needed for moderate pain.   zolpidem 5 MG tablet Commonly known as:  AMBIEN Take 5 mg by mouth at bedtime as needed for sleep.        DISCHARGE INSTRUCTIONS:   DIET:  Regular diet DISCHARGE CONDITION:  Good ACTIVITY:  Activity as tolerated OXYGEN:  Home Oxygen: No.  Oxygen Delivery: room air DISCHARGE LOCATION:  home   If you experience worsening of your  admission symptoms, develop shortness of breath, life threatening emergency, suicidal or homicidal thoughts you must seek medical attention immediately by calling 911 or calling your MD immediately  if symptoms less severe.  You Must read complete instructions/literature along with all the possible adverse reactions/side effects for all the Medicines you take and that have been prescribed to you. Take any new Medicines after you have completely understood and accpet all the possible adverse reactions/side effects.   Please note  You were cared for by a hospitalist during your hospital stay. If you have any questions about your discharge medications or the care you received while you were in the hospital after you are discharged, you can call the unit and asked to speak with the hospitalist on call if the hospitalist that took care of you is not available. Once you are discharged, your primary care physician will handle any further medical issues. Please note that NO REFILLS for any discharge medications will be authorized once you are discharged, as it is imperative that you return to your primary care physician (or establish a relationship with a primary care physician if you do not have one) for your aftercare needs so that they can reassess your need for medications and monitor your lab values.    On the day of Discharge:  VITAL SIGNS:  Blood pressure 97/66, pulse 82, temperature 97.3 F (36.3 C), resp. rate 17, height 5\' 1"  (1.549 m), weight 72.6 kg (160 lb), SpO2 100 %. PHYSICAL EXAMINATION:  GENERAL:  55 y.o.-year-old patient lying in the bed with no acute distress.  EYES: Pupils equal, round, reactive to light and accommodation. No scleral icterus. Extraocular muscles intact.  HEENT: Head atraumatic, normocephalic. Oropharynx and nasopharynx clear.  NECK:  Supple, no jugular venous distention. No thyroid enlargement, no tenderness.  LUNGS: Normal breath sounds bilaterally, no wheezing,  rales,rhonchi or crepitation. No use of accessory muscles of respiration.  CARDIOVASCULAR: S1, S2 normal. No murmurs, rubs, or gallops.  ABDOMEN: Soft, non-tender, non-distended. Bowel sounds present. No organomegaly or mass.  EXTREMITIES: No pedal edema, cyanosis, or clubbing.  NEUROLOGIC: Cranial nerves II through XII are intact. Muscle strength 5/5 in all extremities. Sensation intact. Gait not checked.  PSYCHIATRIC: The patient is alert and oriented x 3.  SKIN: No obvious rash, lesion, or ulcer.  DATA REVIEW:   CBC  Recent Labs Lab 08/01/16 0354  WBC 9.4  HGB 12.8  HCT 38.8  PLT 303    Chemistries   Recent Labs Lab 07/30/16 1012  08/01/16 0354  NA 141  < > 143  K 3.7  < > 3.5  CL 108  < > 108  CO2 26  < > 30  GLUCOSE 132*  < > 120*  BUN 15  < > 15  CREATININE 0.88  < > 0.90  CALCIUM 9.7  < >  9.3  AST 20  --   --   ALT 41  --   --   ALKPHOS 114  --   --   BILITOT 0.6  --   --   < > = values in this interval not displayed.   Follow-up Information    [name], Lindsey F, NP. Schedule an appointment as soon as possible for a visit on 08/07/2016.   Specialty:  Nurse Practitioner Why:  11am Contact information: [address] [place] [id number] [phone]        Lynnae PrudeELLIOTT, ROBERT, MD. Schedule an appointment as soon as possible for a visit on 08/27/2016.   Specialty:  Gastroenterology Why:  AT 1030 WITH [facility] Ltd[facility]HELLE JOHNSON Contact information: [address] [facility] [place] [id number] [phone]           Management plans discussed with the patient, family and they are in agreement.  CODE STATUS: FULL CODE  TOTAL TIME TAKING CARE OF THIS PATIENT: 45 minutes.    [facility] Medcenter[facility]H, Ipek Westra M.D on 08/01/2016 at 2:36 PM  Between 7am to 6pm - Pager - [phone]  After 6pm go to [email or web address] - Social research officer, [email or web address] [email or web address]  [facility] [place] [facility]  Office  [phone]  CC: Primary care physician; [name],  Lindsey F, NP   Note: This dictation was prepared with Dragon dictation along with smaller phrase technology. Any transcriptional errors that result from this process are unintentional.

## 2016-08-01 NOTE — Care Management Important Message (Signed)
Important Message  Patient Details  Name: Kerri HugueninJanet Dulay MRN: [id number] Date of Birth: 1961/08/08   Medicare Important Message Given:  Yes    Marily MemosLisa M Rhian Asebedo, RN 08/01/2016, 10:37 AM

## 2016-08-01 NOTE — Discharge Instructions (Signed)
°  Colitis °Colitis is inflammation of the colon. Colitis may last a short time (acute) or it may last a long time (chronic). °CAUSES °This condition may be caused by: °· Viruses. °· Bacteria. °· Reactions to medicine. °· Certain autoimmune diseases, such as Crohn disease or ulcerative colitis. °SYMPTOMS °Symptoms of this condition include: °· Diarrhea. °· Passing bloody or tarry stool. °· Pain. °· Fever. °· Vomiting. °· Tiredness (fatigue). °· Weight loss. °· Bloating. °· Sudden increase in abdominal pain. °· Having fewer bowel movements than usual. °DIAGNOSIS °This condition is diagnosed with a stool test or a blood test. You may also have other tests, including X-rays, a CT scan, or a colonoscopy. °TREATMENT °Treatment may include: °· Resting the bowel. This involves not eating or drinking for a period of time. °· Fluids that are given through an IV tube. °· Medicine for pain and diarrhea. °· Antibiotic medicines. °· Cortisone medicines. °· Surgery. °HOME CARE INSTRUCTIONS °Eating and Drinking °· Follow instructions from your health care provider about eating or drinking restrictions. °· Drink enough fluid to keep your urine clear or pale yellow. °· Work with a dietitian to determine which foods cause your condition to flare up. °· Avoid foods that cause flare-ups. °· Eat a well-balanced diet. °Medicines °· Take over-the-counter and prescription medicines only as told by your health care provider. °· If you were prescribed an antibiotic medicine, take it as told by your health care provider. Do not stop taking the antibiotic even if you start to feel better. °General Instructions °· Keep all follow-up visits as told by your health care provider. This is important. °SEEK MEDICAL CARE IF: °· Your symptoms do not go away. °· You develop new symptoms. °SEEK IMMEDIATE MEDICAL CARE IF: °· You have a fever that does not go away with treatment. °· You develop chills. °· You have extreme weakness, fainting, or  dehydration. °· You have repeated vomiting. °· You develop severe pain in your abdomen. °· You pass bloody or tarry stool. °  °This information is not intended to replace advice given to you by your health care provider. Make sure you discuss any questions you have with your health care provider. °  °Document Released: 12/05/2004 Document Revised: 07/19/2015 Document Reviewed: 02/20/2015 °Elsevier Interactive Patient Education ©2016 Elsevier Inc. ° °

## 2017-02-16 ENCOUNTER — Emergency Department: Payer: Medicare Other

## 2017-02-16 ENCOUNTER — Encounter: Payer: Self-pay | Admitting: Emergency Medicine

## 2017-02-16 ENCOUNTER — Emergency Department
Admission: EM | Admit: 2017-02-16 | Discharge: 2017-02-16 | Disposition: A | Discharge status: Home / Self Care | Payer: Medicare Other | Attending: Emergency Medicine | Admitting: Emergency Medicine

## 2017-02-16 DIAGNOSIS — Z7984 Long term (current) use of oral hypoglycemic drugs: Secondary | ICD-10-CM | POA: Diagnosis not present

## 2017-02-16 DIAGNOSIS — K2900 Acute gastritis without bleeding: Secondary | ICD-10-CM | POA: Insufficient documentation

## 2017-02-16 DIAGNOSIS — I10 Essential (primary) hypertension: Secondary | ICD-10-CM | POA: Diagnosis not present

## 2017-02-16 DIAGNOSIS — J449 Chronic obstructive pulmonary disease, unspecified: Secondary | ICD-10-CM | POA: Diagnosis not present

## 2017-02-16 DIAGNOSIS — Z87891 Personal history of nicotine dependence: Secondary | ICD-10-CM | POA: Diagnosis not present

## 2017-02-16 DIAGNOSIS — E119 Type 2 diabetes mellitus without complications: Secondary | ICD-10-CM | POA: Insufficient documentation

## 2017-02-16 DIAGNOSIS — Z79899 Other long term (current) drug therapy: Secondary | ICD-10-CM | POA: Diagnosis not present

## 2017-02-16 DIAGNOSIS — R1013 Epigastric pain: Secondary | ICD-10-CM | POA: Diagnosis present

## 2017-02-16 DIAGNOSIS — E039 Hypothyroidism, unspecified: Secondary | ICD-10-CM | POA: Insufficient documentation

## 2017-02-16 LAB — CBC WITH DIFFERENTIAL/PLATELET
BASOS ABS: 0 10*3/uL (ref 0–0.1)
Basophils Relative: 0 %
Eosinophils Absolute: 0.3 10*3/uL (ref 0–0.7)
Eosinophils Relative: 2 %
HEMATOCRIT: 42.4 % (ref 35.0–47.0)
Hemoglobin: 13.8 g/dL (ref 12.0–16.0)
LYMPHS PCT: 9 %
Lymphs Abs: 1.4 10*3/uL (ref 1.0–3.6)
MCH: 25.6 pg — ABNORMAL LOW (ref 26.0–34.0)
MCHC: 32.5 g/dL (ref 32.0–36.0)
MCV: 78.9 fL — AB (ref 80.0–100.0)
MONO ABS: 1 10*3/uL — AB (ref 0.2–0.9)
Monocytes Relative: 6 %
NEUTROS ABS: 13.8 10*3/uL — AB (ref 1.4–6.5)
Neutrophils Relative %: 83 %
Platelets: 360 10*3/uL (ref 150–440)
RBC: 5.37 MIL/uL — ABNORMAL HIGH (ref 3.80–5.20)
RDW: 16.5 % — AB (ref 11.5–14.5)
WBC: 16.6 10*3/uL — ABNORMAL HIGH (ref 3.6–11.0)

## 2017-02-16 LAB — COMPREHENSIVE METABOLIC PANEL
ALBUMIN: 3.9 g/dL (ref 3.5–5.0)
ALK PHOS: 86 U/L (ref 38–126)
ALT: 18 U/L (ref 14–54)
AST: 19 U/L (ref 15–41)
Anion gap: 9 (ref 5–15)
BILIRUBIN TOTAL: 0.5 mg/dL (ref 0.3–1.2)
BUN: 14 mg/dL (ref 6–20)
CHLORIDE: 102 mmol/L (ref 101–111)
CO2: 29 mmol/L (ref 22–32)
CREATININE: 0.58 mg/dL (ref 0.44–1.00)
Calcium: 9.5 mg/dL (ref 8.9–10.3)
GFR calc Af Amer: >60 mL/min (ref >60)
GFR calc non Af Amer: >60 mL/min (ref >60)
GLUCOSE: 126 mg/dL — AB (ref 65–99)
Potassium: 3.4 mmol/L — ABNORMAL LOW (ref 3.5–5.1)
Sodium: 140 mmol/L (ref 135–145)
Total Protein: 6.9 g/dL (ref 6.5–8.1)

## 2017-02-16 LAB — LIPASE, BLOOD: Lipase: 13 U/L (ref 11–51)

## 2017-02-16 LAB — TROPONIN I

## 2017-02-16 LAB — LACTIC ACID, PLASMA: Lactic Acid, Venous: 1.2 mmol/L (ref 0.5–1.9)

## 2017-02-16 MED ORDER — SUCRALFATE 1 G PO TABS: 1.0000 g | ORAL_TABLET | Freq: Four times a day (QID) | ORAL | 1 refills | Status: DC

## 2017-02-16 MED ORDER — PROMETHAZINE HCL 12.5 MG PO TABS: 12.5000 mg | ORAL_TABLET | Freq: Four times a day (QID) | ORAL | 0 refills | Status: DC | PRN

## 2017-02-16 MED ORDER — FENTANYL CITRATE (PF) 100 MCG/2ML IJ SOLN
50.0000 ug | Freq: Once | INTRAMUSCULAR | Status: AC
  Administered 2017-02-16: 50 ug via INTRAVENOUS
  Filled 2017-02-16: qty 2

## 2017-02-16 MED ORDER — ONDANSETRON HCL 4 MG/2ML IJ SOLN
4.0000 mg | Freq: Once | INTRAMUSCULAR | Status: AC
  Administered 2017-02-16: 4 mg via INTRAVENOUS

## 2017-02-16 MED ORDER — IOPAMIDOL (ISOVUE-300) INJECTION 61%
100.0000 mL | Freq: Once | INTRAVENOUS | Status: AC | PRN
  Administered 2017-02-16: 100 mL via INTRAVENOUS

## 2017-02-16 MED ORDER — GI COCKTAIL ~~LOC~~
30.0000 mL | Freq: Once | ORAL | Status: AC
  Administered 2017-02-16: 30 mL via ORAL
  Filled 2017-02-16: qty 30

## 2017-02-16 MED ORDER — SODIUM CHLORIDE 0.9 % IV BOLUS (SEPSIS): 1000.0000 mL | Freq: Once | INTRAVENOUS | Status: DC

## 2017-02-16 MED ORDER — ONDANSETRON HCL 4 MG/2ML IJ SOLN
INTRAMUSCULAR | Status: AC
  Filled 2017-02-16: qty 2

## 2017-02-16 MED ORDER — SODIUM CHLORIDE 0.9 % IV BOLUS (SEPSIS)
1000.0000 mL | Freq: Once | INTRAVENOUS | Status: AC
  Administered 2017-02-16: 1000 mL via INTRAVENOUS

## 2017-02-16 MED ORDER — SODIUM CHLORIDE 0.9 % IV BOLUS (SEPSIS): 250.0000 mL | Freq: Once | INTRAVENOUS | Status: DC

## 2017-02-16 MED ORDER — PANTOPRAZOLE SODIUM 20 MG PO TBEC: 20.0000 mg | DELAYED_RELEASE_TABLET | Freq: Every day | ORAL | 1 refills | Status: DC

## 2017-02-16 MED ORDER — IOPAMIDOL (ISOVUE-300) INJECTION 61%
30.0000 mL | Freq: Once | INTRAVENOUS | Status: AC | PRN
  Administered 2017-02-16: 30 mL via ORAL

## 2017-02-16 NOTE — ED Provider Notes (Signed)
[facility] Emergency Department Provider Note   ____________________________________________    I have reviewed the triage vital signs and the nursing notes.   HISTORY  Chief Complaint Abdominal Pain and Nausea     HPI Kerri Steele is a 56 y.o. female who presents with complaints of abdominal pain. She reports it is primarily in her epigastrium. She reports she has had pain there for 3 days now. It has not abated. She has never had this before. She reports nausea but no vomiting. She did have diarrhea one week ago which has resolved. She denies chest pain, no shortness of breath. Positive subjective chills, no fevers reported. No history of abdominal surgery. She describes the pain is cramping and moderate to severe in nature.   Past Medical History:  Diagnosis Date  . Anginal pain (HCC)   . Anxiety   . COPD (chronic obstructive pulmonary disease) (HCC)   . Depression   . Diabetes mellitus without complication (HCC)   . GERD (gastroesophageal reflux disease)   . Hypertension   . Hypothyroidism   . Pneumonia   . Shortness of breath dyspnea   . Sleep apnea     Patient Active Problem List   Diagnosis Date Noted  . Acute colitis 07/30/2016  . Breath shortness 10/18/2012    Past Surgical History:  Procedure Laterality Date  . ANKLE SURGERY    . BLADDER SURGERY    . CARDIAC CATHETERIZATION Left 08/23/2015   Procedure: Left Heart Cath and Coronary Angiography;  Surgeon: Dalia Heading, MD;  Location: [facility] INVASIVE CV LAB;  Service: Cardiovascular;  Laterality: Left;  . ESOPHAGEAL DILATION    . TUBAL LIGATION    . WRIST SURGERY      Prior to Admission medications   Medication Sig Start Date End Date Taking? Authorizing Provider  albuterol-ipratropium (COMBIVENT) 18-103 MCG/ACT inhaler Inhale 2 puffs into the lungs every 4 (four) hours as needed for wheezing or shortness of breath.   Yes Historical Provider, MD  atorvastatin (LIPITOR) 20 MG  tablet Take 20 mg by mouth daily.   Yes Historical Provider, MD  DULoxetine (CYMBALTA) 60 MG capsule Take 60 mg by mouth daily.   Yes Historical Provider, MD  gabapentin (NEURONTIN) 800 MG tablet Take 800 mg by mouth 3 (three) times daily.   Yes Historical Provider, MD  glipiZIDE (GLUCOTROL XL) 10 MG 24 hr tablet Take 10 mg by mouth daily with breakfast.   Yes Historical Provider, MD  HYDROcodone-acetaminophen (NORCO/VICODIN) 5-325 MG tablet Take 1 tablet by mouth every 4 (four) hours as needed for moderate pain. Patient taking differently: Take 1 tablet by mouth at bedtime.  10/24/15  Yes Christiane Ha D Cuthriell, PA-C  liraglutide (VICTOZA) 18 MG/3ML SOPN Inject 1.2 mg into the skin at bedtime. 01/01/17  Yes Historical Provider, MD  magnesium oxide (MAG-OX) 400 MG tablet Take 400 mg by mouth 2 (two) times daily.    Yes Historical Provider, MD  methocarbamol (ROBAXIN) 750 MG tablet Take 750 mg by mouth 3 (three) times daily.    Yes Historical Provider, MD  omeprazole (PRILOSEC) 20 MG capsule Take 20 mg by mouth daily.   Yes Historical Provider, MD  tiotropium (SPIRIVA) 18 MCG inhalation capsule Place 18 mcg into inhaler and inhale daily.   Yes Historical Provider, MD  traMADol (ULTRAM) 50 MG tablet Take 50 mg by mouth every 6 (six) hours as needed for moderate pain.   Yes Historical Provider, MD  verapamil (CALAN-SR) 120 MG CR  tablet Take 120 mg by mouth daily. 02/06/17  Yes Historical Provider, MD  arformoterol (BROVANA) 15 MCG/2ML NEBU Take 15 mcg by nebulization 2 (two) times daily.    Historical Provider, MD  pantoprazole (PROTONIX) 20 MG tablet Take 1 tablet (20 mg total) by mouth daily. 02/16/17 02/16/18  Jene Every, MD  promethazine (PHENERGAN) 12.5 MG tablet Take 1 tablet (12.5 mg total) by mouth every 6 (six) hours as needed for nausea or vomiting. 02/16/17   Jene Every, MD  sucralfate (CARAFATE) 1 g tablet Take 1 tablet (1 g total) by mouth 4 (four) times daily. 02/16/17 02/16/18  Jene Every, MD       Allergies Metformin and related; Morphine and related; and Sulfa antibiotics  Family History  Problem Relation Age of Onset  . Heart disease Father   . Hypertension Mother   . Diabetes Mother   . Lupus Mother   . Atrial fibrillation Mother     Social History Social History  Substance Use Topics  . Smoking status: Former Games developer  . Smokeless tobacco: Never Used  . Alcohol use No    Review of Systems  Constitutional: No fever/chills Eyes: No visual changes.   Cardiovascular: Denies chest pain. Respiratory: Denies shortness of breath. Gastrointestinal: As above Genitourinary: Negative for dysuria. Musculoskeletal: Chronic back pain Skin: Negative for rash. Neurological: Negative for headaches or weakness  10-point ROS otherwise negative.  ____________________________________________   PHYSICAL EXAM:  VITAL SIGNS: ED Triage Vitals [02/16/17 0621]  Enc Vitals Group     BP (!) 79/59     Pulse Rate (!) 110     Resp (!) 22     Temp 98.1 F (36.7 C)     Temp Source Oral     SpO2 96 %     Weight 149 lb (67.6 kg)     Height 5' (1.524 m)     Head Circumference      Peak Flow      Pain Score      Pain Loc      Pain Edu?      Excl. in GC?     Constitutional: Alert and oriented. No acute distress. Pleasant and interactive Eyes: Conjunctivae are normal.   Nose: No congestion/rhinnorhea. Mouth/Throat: Mucous membranes are moist.    Cardiovascular: Normal rate, regular rhythm. Grossly normal heart sounds.  Good peripheral circulation.  Respiratory: Normal respiratory effort.  No retractions. Lungs CTAB. Gastrointestinal: Mild epigastric tenderness to palpation.. No distention.  No CVA tenderness. Genitourinary: deferred Musculoskeletal:   Warm and well perfused Neurologic:  Normal speech and language. No gross focal neurologic deficits are appreciated.  Skin:  Skin is warm, dry and intact. No rash noted. Psychiatric: Mood and affect are normal. Speech and  behavior are normal.  ____________________________________________   LABS (all labs ordered are listed, but only abnormal results are displayed)  Labs Reviewed  COMPREHENSIVE METABOLIC PANEL - Abnormal; Notable for the following:       Result Value   Potassium 3.4 (*)    Glucose, Bld 126 (*)    All other components within normal limits  CBC WITH DIFFERENTIAL/PLATELET - Abnormal; Notable for the following:    WBC 16.6 (*)    RBC 5.37 (*)    MCV 78.9 (*)    MCH 25.6 (*)    RDW 16.5 (*)    Neutro Abs 13.8 (*)    Monocytes Absolute 1.0 (*)    All other components within normal limits  LACTIC ACID, PLASMA  LIPASE, BLOOD  TROPONIN I  URINALYSIS, COMPLETE (UACMP) WITH MICROSCOPIC   ____________________________________________  EKG  ED ECG REPORT I, Jene Every, the attending physician, personally viewed and interpreted this ECG.  Date: 02/16/2017 EKG Time: 6:51 AM Rate: 91 Rhythm: normal sinus rhythm QRS Axis: normal Intervals: Prolonged QTC ST/T Wave abnormalities: normal Conduction Disturbances: none Narrative Interpretation: unremarkable  ____________________________________________  RADIOLOGY  CT abdomen and pelvis pending ____________________________________________   PROCEDURES  Procedure(s) performed: No    Critical Care performed: No ____________________________________________   INITIAL IMPRESSION / ASSESSMENT AND PLAN / ED COURSE  Pertinent labs & imaging results that were available during my care of the patient were reviewed by me and considered in my medical decision making (see chart for details).  Patient presents with several days of epigastric pain. This is in the setting of having "GI bug" several days prior to abdominal pain. Denies a history of PUD. Does have a history of colitis. No tenderness over the right upper quadrant. Suspect gastritis/pancreatitis, colitis less likely. We will check labs and consider imaging as  necessary   ----------------------------------------- 9:17 AM on 02/16/2017 -----------------------------------------  Patient reports significant improvement from GI cocktail. However she still tender in the epigastrium. We will obtain CT imaging   CT scan reassuring. Patient reports feeling better. I recommended admission but the patient were strongly prefer to go home. She states she will come back if her symptoms worsen and she does have good GI follow-up. I think this is a reasonable plan. We will start her on Protonix, Carafate for suspected gastritis and possible peptic ulcer disease.    ____________________________________________   FINAL CLINICAL IMPRESSION(S) / ED DIAGNOSES  Final diagnoses:  Acute gastritis without hemorrhage, unspecified gastritis type      NEW MEDICATIONS STARTED DURING THIS VISIT:  Discharge Medication List as of 02/16/2017 11:55 AM    START taking these medications   Details  pantoprazole (PROTONIX) 20 MG tablet Take 1 tablet (20 mg total) by mouth daily., Starting Sun 02/16/2017, Until Mon 02/16/2018, Print    promethazine (PHENERGAN) 12.5 MG tablet Take 1 tablet (12.5 mg total) by mouth every 6 (six) hours as needed for nausea or vomiting., Starting Sun 02/16/2017, Print    sucralfate (CARAFATE) 1 g tablet Take 1 tablet (1 g total) by mouth 4 (four) times daily., Starting Sun 02/16/2017, Until Mon 02/16/2018, Print         Note:  This document was prepared using Dragon voice recognition software and may include unintentional dictation errors.    Jene Every, MD 02/16/17 1315

## 2017-02-16 NOTE — ED Notes (Signed)
Patient transported to CT 

## 2017-02-16 NOTE — ED Triage Notes (Signed)
Pt present POV with c/o nausea and abdominal pain. Pt arrives with spouse and reports abdominal pain that hurts "all over". Pt is in NAD at this time in triage.

## 2017-07-23 ENCOUNTER — Other Ambulatory Visit: Payer: Self-pay | Admitting: Nurse Practitioner

## 2017-07-23 DIAGNOSIS — M7989 Other specified soft tissue disorders: Secondary | ICD-10-CM

## 2017-08-01 ENCOUNTER — Emergency Department
Admission: EM | Admit: 2017-08-01 | Discharge: 2017-08-01 | Disposition: A | Discharge status: Home / Self Care | Payer: Medicare Other | Attending: Emergency Medicine | Admitting: Emergency Medicine

## 2017-08-01 ENCOUNTER — Encounter: Payer: Self-pay | Admitting: Emergency Medicine

## 2017-08-01 DIAGNOSIS — L02811 Cutaneous abscess of head [any part, except face]: Secondary | ICD-10-CM | POA: Diagnosis not present

## 2017-08-01 DIAGNOSIS — L0291 Cutaneous abscess, unspecified: Secondary | ICD-10-CM

## 2017-08-01 DIAGNOSIS — E119 Type 2 diabetes mellitus without complications: Secondary | ICD-10-CM | POA: Diagnosis not present

## 2017-08-01 DIAGNOSIS — Z79899 Other long term (current) drug therapy: Secondary | ICD-10-CM | POA: Diagnosis not present

## 2017-08-01 DIAGNOSIS — R220 Localized swelling, mass and lump, head: Secondary | ICD-10-CM | POA: Diagnosis present

## 2017-08-01 DIAGNOSIS — I10 Essential (primary) hypertension: Secondary | ICD-10-CM | POA: Diagnosis not present

## 2017-08-01 DIAGNOSIS — Z87891 Personal history of nicotine dependence: Secondary | ICD-10-CM | POA: Insufficient documentation

## 2017-08-01 DIAGNOSIS — J449 Chronic obstructive pulmonary disease, unspecified: Secondary | ICD-10-CM | POA: Diagnosis not present

## 2017-08-01 LAB — CBC WITH DIFFERENTIAL/PLATELET
Basophils Absolute: 0.1 10*3/uL (ref 0–0.1)
Basophils Relative: 1 %
EOS ABS: 0.3 10*3/uL (ref 0–0.7)
Eosinophils Relative: 3 %
HEMATOCRIT: 39.7 % (ref 35.0–47.0)
HEMOGLOBIN: 13.2 g/dL (ref 12.0–16.0)
LYMPHS ABS: 2.5 10*3/uL (ref 1.0–3.6)
Lymphocytes Relative: 25 %
MCH: 26.3 pg (ref 26.0–34.0)
MCHC: 33.3 g/dL (ref 32.0–36.0)
MCV: 79.1 fL — ABNORMAL LOW (ref 80.0–100.0)
MONOS PCT: 7 %
Monocytes Absolute: 0.7 10*3/uL (ref 0.2–0.9)
NEUTROS PCT: 64 %
Neutro Abs: 6.6 10*3/uL — ABNORMAL HIGH (ref 1.4–6.5)
Platelets: 312 10*3/uL (ref 150–440)
RBC: 5.02 MIL/uL (ref 3.80–5.20)
RDW: 15.3 % — ABNORMAL HIGH (ref 11.5–14.5)
WBC: 10 10*3/uL (ref 3.6–11.0)

## 2017-08-01 LAB — COMPREHENSIVE METABOLIC PANEL
ALBUMIN: 3.8 g/dL (ref 3.5–5.0)
ALT: 33 U/L (ref 14–54)
AST: 24 U/L (ref 15–41)
Alkaline Phosphatase: 110 U/L (ref 38–126)
Anion gap: 10 (ref 5–15)
BUN: 14 mg/dL (ref 6–20)
CHLORIDE: 103 mmol/L (ref 101–111)
CO2: 27 mmol/L (ref 22–32)
CREATININE: 0.82 mg/dL (ref 0.44–1.00)
Calcium: 9.4 mg/dL (ref 8.9–10.3)
GFR calc non Af Amer: >60 mL/min (ref >60)
GLUCOSE: 194 mg/dL — AB (ref 65–99)
Potassium: 3.5 mmol/L (ref 3.5–5.1)
SODIUM: 140 mmol/L (ref 135–145)
Total Bilirubin: 0.5 mg/dL (ref 0.3–1.2)
Total Protein: 6.9 g/dL (ref 6.5–8.1)

## 2017-08-01 LAB — LACTIC ACID, PLASMA: LACTIC ACID, VENOUS: 1.2 mmol/L (ref 0.5–1.9)

## 2017-08-01 MED ORDER — PROPOFOL 10 MG/ML IV BOLUS
INTRAVENOUS | Status: AC | PRN
  Administered 2017-08-01: 60 mg via INTRAVENOUS
  Administered 2017-08-01: 30 mg via INTRAVENOUS

## 2017-08-01 MED ORDER — PROPOFOL 1000 MG/100ML IV EMUL
INTRAVENOUS | Status: AC
  Filled 2017-08-01: qty 100

## 2017-08-01 MED ORDER — PROPOFOL 10 MG/ML IV BOLUS: 60.0000 mg | Freq: Once | INTRAVENOUS | Status: DC

## 2017-08-01 MED ORDER — LIDOCAINE-EPINEPHRINE 1 %-1:100000 IJ SOLN
30.0000 mL | Freq: Once | INTRAMUSCULAR | Status: DC
  Filled 2017-08-01: qty 30

## 2017-08-01 MED ORDER — SODIUM CHLORIDE 0.9 % IV BOLUS (SEPSIS)
1000.0000 mL | Freq: Once | INTRAVENOUS | Status: AC
  Administered 2017-08-01: 1000 mL via INTRAVENOUS

## 2017-08-01 MED ORDER — HYDROCODONE-ACETAMINOPHEN 5-325 MG PO TABS: 1.0000 | ORAL_TABLET | Freq: Four times a day (QID) | ORAL | 0 refills | Status: DC | PRN

## 2017-08-01 NOTE — ED Notes (Signed)
pts ride home at bedside, pt states discharge understanding and understanding to not make any decisions due to sedation

## 2017-08-01 NOTE — ED Notes (Signed)
Pt awake, maintaining o2 sats and normal resp, pt AOx4, able to answer all questions appropiately

## 2017-08-01 NOTE — ED Notes (Signed)
Pt placed on 2L  prior to sedation

## 2017-08-01 NOTE — ED Triage Notes (Signed)
Patient presents to the ED with a "lump" to her head that is draining puss.  Patient states she was put on clindamycin on Wednesday but today she had a fever of "100 something" and her pain has been worse.  Patient reports taking hydrocodone without relief.  Patient states, "My face feels like it's on fire."  Patient states she took ibuprofen around 11am for her fever.  Patient is in no obvious distress at this time.

## 2017-08-01 NOTE — ED Notes (Signed)
Pt sitting in bed talking to husband, denies any needs at this time

## 2017-08-01 NOTE — ED Notes (Signed)
Signature pad not working in room, pts spouse Coraline Talwar at bedside, states discharge understanding

## 2017-08-01 NOTE — Sedation Documentation (Signed)
Patient identified as [name] by Zollie Scale RN, Dr. Lamont Snowball MD, and Lenise Arena.

## 2017-08-01 NOTE — ED Provider Notes (Signed)
[facility] Emergency Department Provider Note  ____________________________________________   First MD Initiated Contact with Patient 08/01/17 1549     (approximate)  I have reviewed the triage vital signs and the nursing notes.   HISTORY  Chief Complaint Abscess   HPI Kerri Steele is a 56 y.o. female he sent to the emergency department by her primary care nurse practitioner for IV antibiotics. The patient reports having a painful infected "bump" to the top back of her head for the past 4-5 days or so. She saw her primary care provider several days ago who prescribed her clindamycin and did not want to incise or drain the abscess as "it was too hard". The patient reports subjective fever at home but no chills. Today she went to her providers office who advised her to come to the emergency department for further evaluation. Her mass began insidiously and has been slowly progressive. The pain is now severe. It is worse when touching improved with rest.   Past Medical History:  Diagnosis Date  . Anginal pain (HCC)   . Anxiety   . COPD (chronic obstructive pulmonary disease) (HCC)   . Depression   . Diabetes mellitus without complication (HCC)   . GERD (gastroesophageal reflux disease)   . Hypertension   . Hypothyroidism   . Pneumonia   . Shortness of breath dyspnea   . Sleep apnea     Patient Active Problem List   Diagnosis Date Noted  . Acute colitis 07/30/2016  . Breath shortness 10/18/2012    Past Surgical History:  Procedure Laterality Date  . ANKLE SURGERY    . BLADDER SURGERY    . CARDIAC CATHETERIZATION Left 08/23/2015   Procedure: Left Heart Cath and Coronary Angiography;  Surgeon: Dalia Heading, MD;  Location: [facility] INVASIVE CV LAB;  Service: Cardiovascular;  Laterality: Left;  . ESOPHAGEAL DILATION    . TUBAL LIGATION    . WRIST SURGERY      Prior to Admission medications   Medication Sig Start Date End Date Taking? Authorizing  Provider  albuterol-ipratropium (COMBIVENT) 18-103 MCG/ACT inhaler Inhale 2 puffs into the lungs every 4 (four) hours as needed for wheezing or shortness of breath.    [provider]  arformoterol (BROVANA) 15 MCG/2ML NEBU Take 15 mcg by nebulization 2 (two) times daily.    [provider]  atorvastatin (LIPITOR) 20 MG tablet Take 20 mg by mouth daily.    [provider]  DULoxetine (CYMBALTA) 60 MG capsule Take 60 mg by mouth daily.    [provider]  gabapentin (NEURONTIN) 800 MG tablet Take 800 mg by mouth 3 (three) times daily.    [provider]  glipiZIDE (GLUCOTROL XL) 10 MG 24 hr tablet Take 10 mg by mouth daily with breakfast.    [provider]  HYDROcodone-acetaminophen (NORCO) 5-325 MG tablet Take 1 tablet by mouth every 6 (six) hours as needed for severe pain. 08/01/17   Merrily Brittle, MD  HYDROcodone-acetaminophen (NORCO/VICODIN) 5-325 MG tablet Take 1 tablet by mouth every 4 (four) hours as needed for moderate pain. Patient taking differently: Take 1 tablet by mouth at bedtime.  10/24/15   Cuthriell, Delorise Royals, PA-C  liraglutide (VICTOZA) 18 MG/3ML SOPN Inject 1.2 mg into the skin at bedtime. 01/01/17   [provider]  magnesium oxide (MAG-OX) 400 MG tablet Take 400 mg by mouth 2 (two) times daily.     [provider]  methocarbamol (ROBAXIN) 750 MG tablet Take  750 mg by mouth 3 (three) times daily.     [provider]  omeprazole (PRILOSEC) 20 MG capsule Take 20 mg by mouth daily.    [provider]  pantoprazole (PROTONIX) 20 MG tablet Take 1 tablet (20 mg total) by mouth daily. 02/16/17 02/16/18  Jene Every, MD  promethazine (PHENERGAN) 12.5 MG tablet Take 1 tablet (12.5 mg total) by mouth every 6 (six) hours as needed for nausea or vomiting. 02/16/17   Jene Every, MD  sucralfate (CARAFATE) 1 g tablet Take 1 tablet (1 g total) by mouth 4 (four) times daily. 02/16/17 02/16/18  Jene Every,  MD  tiotropium (SPIRIVA) 18 MCG inhalation capsule Place 18 mcg into inhaler and inhale daily.    [provider]  traMADol (ULTRAM) 50 MG tablet Take 50 mg by mouth every 6 (six) hours as needed for moderate pain.    [provider]  verapamil (CALAN-SR) 120 MG CR tablet Take 120 mg by mouth daily. 02/06/17   [provider]    Allergies Metformin and related; Morphine and related; and Sulfa antibiotics  Family History  Problem Relation Age of Onset  . Heart disease Father   . Hypertension Mother   . Diabetes Mother   . Lupus Mother   . Atrial fibrillation Mother     Social History Social History  Substance Use Topics  . Smoking status: Former Games developer  . Smokeless tobacco: Never Used  . Alcohol use No    Review of Systems Constitutional: positive fever Eyes: No visual changes. ENT: No sore throat. Cardiovascular: Denies chest pain. Respiratory: Denies shortness of breath. Gastrointestinal: No abdominal pain.  No nausea, no vomiting.  No diarrhea.  No constipation. Genitourinary: Negative for dysuria. Musculoskeletal: Negative for back pain. Skin: positive wound Neurological: Negative for headaches, focal weakness or numbness.   ____________________________________________   PHYSICAL EXAM:  VITAL SIGNS: ED Triage Vitals  Enc Vitals Group     BP 08/01/17 1320 137/84     Pulse Rate 08/01/17 1320 (!) 105     Resp 08/01/17 1320 16     Temp 08/01/17 1320 98.2 F (36.8 C)     Temp Source 08/01/17 1320 Oral     SpO2 08/01/17 1320 97 %     Weight 08/01/17 1322 141 lb (64 kg)     Height 08/01/17 1322 5' (1.524 m)     Head Circumference --      Peak Flow --      Pain Score 08/01/17 1320 10     Pain Loc --      Pain Edu? --      Excl. in GC? --     Constitutional: alert and oriented 4 tearful and appears uncomfortable no diaphoresis speaks in full sentences Eyes: PERRL EOMI. Head: Atraumatic. Nose: No  congestion/rhinnorhea. Mouth/Throat: No trismus Neck: No stridor.   Cardiovascular: Normal rate, regular rhythm. Grossly normal heart sounds.  Good peripheral circulation. Respiratory: Normal respiratory effort.  No retractions. Lungs CTAB and moving good air Gastrointestinal: soft nontender Musculoskeletal: No lower extremity edema   Neurologic:  Normal speech and language. No gross focal neurologic deficits are appreciated. Skin:  abscess to the vertex of the scalp roughly 3 cm with overlying cellulitis and honey crusting it is indurated and quite tender Psychiatric: Mood and affect are normal. Speech and behavior are normal.    ____________________________________________   DIFFERENTIAL includes but not limited to  abscess, cellulitis, sebaceous cyst ____________________________________________   LABS (all labs ordered are  listed, but only abnormal results are displayed)  Labs Reviewed  COMPREHENSIVE METABOLIC PANEL - Abnormal; Notable for the following:       Result Value   Glucose, Bld 194 (*)    All other components within normal limits  CBC WITH DIFFERENTIAL/PLATELET - Abnormal; Notable for the following:    MCV 79.1 (*)    RDW 15.3 (*)    Neutro Abs 6.6 (*)    All other components within normal limits  LACTIC ACID, PLASMA    blood work reviewed by me shows medically insignificant elevated glucose __________________________________________  EKG   ____________________________________________  RADIOLOGY   ____________________________________________   PROCEDURES  Procedure(s) performed: yes  PROCEDURAL SEDATION please see nursing notes for exact dosages the patient received procedural sedation initially was 60 mg of propofol intravenously achieving good anesthesia. During the procedure she began to experience discomfort so I redosed her with an additional 30 mg of propofol. Throughout the procedure the patient was on cardiac and pulse ox monitoring and she  never dropped her blood pressure, never became apneic, and had no complications. She awoke without issue.  INCISION AND DRAINAGE Performed by: Merrily Brittle Consent: Verbal consent obtained. Risks and benefits: risks, benefits and alternatives were discussed Type: abscess  Body area: scalp  Anesthesia: local infiltration  Incision was made with a scalpel.   Complexity: complex Blunt dissection to break up loculations  Drainage: purulent  Drainage amount: 5 cc   Patient tolerance: Patient tolerated the procedure well with no immediate complications.     Procedures  Critical Care performed: no  Observation: no ____________________________________________   INITIAL IMPRESSION / ASSESSMENT AND PLAN / ED COURSE  Pertinent labs & imaging results that were available during my care of the patient were reviewed by me and considered in my medical decision making (see chart for details).  The patient arrives with an abscess and overlying cellulitis to her scalp which has failed outpatient clindamycin. Unfortunately the patient is allergic to sulfa antibiotics and cannot have Bactrim. I recommended incision and drainage for definitive source control however the patient is on chronic opioid pain medications and said she would be unable to receive an incision and drainage without procedural sedation. She was consented and received procedural sedation with intravenous propofol and I performed an incision and drainage without complication. Following the procedure the patient is awake, appropriate, and her pain is minimal. She'll be discharged home with a 2 day wound check.      ____________________________________________   FINAL CLINICAL IMPRESSION(S) / ED DIAGNOSES  Final diagnoses:  Abscess      NEW MEDICATIONS STARTED DURING THIS VISIT:  New Prescriptions   HYDROCODONE-ACETAMINOPHEN (NORCO) 5-325 MG TABLET    Take 1 tablet by mouth every 6 (six) hours as needed for  severe pain.     Note:  This document was prepared using Dragon voice recognition software and may include unintentional dictation errors.     Merrily Brittle, MD 08/01/17 1753

## 2017-08-01 NOTE — Discharge Instructions (Addendum)
Please keep your incision clean and dry and make sure you wash it out every day in the shower. Follow-up with your primary care provider this coming Monday for reevaluation and return to the emergency department sooner for any new or worsening symptoms such as fevers, worsening pain, or for any other issues whatsoever.  It was a pleasure to take care of you today, and thank you for coming to our emergency department.  If you have any questions or concerns before leaving please ask the nurse to grab me and I'm more than happy to go through your aftercare instructions again.  If you were prescribed any opioid pain medication today such as Norco, Vicodin, Percocet, morphine, hydrocodone, or oxycodone please make sure you do not drive when you are taking this medication as it can alter your ability to drive safely.  If you have any concerns once you are home that you are not improving or are in fact getting worse before you can make it to your follow-up appointment, please do not hesitate to call [phone] and come back for further evaluation.  Merrily Brittle, MD  Results for orders placed or performed during the hospital encounter of 08/01/17  Lactic acid, plasma  Result Value Ref Range   Lactic Acid, Venous 1.2 0.5 - 1.9 mmol/L  Comprehensive metabolic panel  Result Value Ref Range   Sodium 140 135 - 145 mmol/L   Potassium 3.5 3.5 - 5.1 mmol/L   Chloride 103 101 - 111 mmol/L   CO2 27 22 - 32 mmol/L   Glucose, Bld 194 (H) 65 - 99 mg/dL   BUN 14 6 - 20 mg/dL   Creatinine, Ser 4.54 0.44 - 1.00 mg/dL   Calcium 9.4 8.9 - 09.8 mg/dL   Total Protein 6.9 6.5 - 8.1 g/dL   Albumin 3.8 3.5 - 5.0 g/dL   AST 24 15 - 41 U/L   ALT 33 14 - 54 U/L   Alkaline Phosphatase 110 38 - 126 U/L   Total Bilirubin 0.5 0.3 - 1.2 mg/dL   GFR calc non Af Amer >60 >60 mL/min   GFR calc Af Amer >60 >60 mL/min   Anion gap 10 5 - 15  CBC with Differential  Result Value Ref Range   WBC 10.0 3.6 - 11.0 K/uL   RBC 5.02 3.80  - 5.20 MIL/uL   Hemoglobin 13.2 12.0 - 16.0 g/dL   HCT 11.9 14.7 - 82.9 %   MCV 79.1 (L) 80.0 - 100.0 fL   MCH 26.3 26.0 - 34.0 pg   MCHC 33.3 32.0 - 36.0 g/dL   RDW 56.2 (H) 13.0 - 86.5 %   Platelets 312 150 - 440 K/uL   Neutrophils Relative % 64 %   Neutro Abs 6.6 (H) 1.4 - 6.5 K/uL   Lymphocytes Relative 25 %   Lymphs Abs 2.5 1.0 - 3.6 K/uL   Monocytes Relative 7 %   Monocytes Absolute 0.7 0.2 - 0.9 K/uL   Eosinophils Relative 3 %   Eosinophils Absolute 0.3 0 - 0.7 K/uL   Basophils Relative 1 %   Basophils Absolute 0.1 0 - 0.1 K/uL

## 2017-08-22 ENCOUNTER — Emergency Department: Payer: Medicare Other

## 2017-08-22 ENCOUNTER — Emergency Department
Admission: EM | Admit: 2017-08-22 | Discharge: 2017-08-23 | Disposition: A | Discharge status: Home / Self Care | Payer: Medicare Other | Attending: Emergency Medicine | Admitting: Emergency Medicine

## 2017-08-22 ENCOUNTER — Encounter: Payer: Self-pay | Admitting: Emergency Medicine

## 2017-08-22 DIAGNOSIS — Z79899 Other long term (current) drug therapy: Secondary | ICD-10-CM | POA: Insufficient documentation

## 2017-08-22 DIAGNOSIS — E876 Hypokalemia: Secondary | ICD-10-CM | POA: Diagnosis not present

## 2017-08-22 DIAGNOSIS — K529 Noninfective gastroenteritis and colitis, unspecified: Secondary | ICD-10-CM | POA: Diagnosis not present

## 2017-08-22 DIAGNOSIS — R1030 Lower abdominal pain, unspecified: Secondary | ICD-10-CM | POA: Diagnosis present

## 2017-08-22 DIAGNOSIS — E119 Type 2 diabetes mellitus without complications: Secondary | ICD-10-CM | POA: Diagnosis not present

## 2017-08-22 DIAGNOSIS — J449 Chronic obstructive pulmonary disease, unspecified: Secondary | ICD-10-CM | POA: Diagnosis not present

## 2017-08-22 DIAGNOSIS — E039 Hypothyroidism, unspecified: Secondary | ICD-10-CM | POA: Diagnosis not present

## 2017-08-22 DIAGNOSIS — Z87891 Personal history of nicotine dependence: Secondary | ICD-10-CM | POA: Diagnosis not present

## 2017-08-22 DIAGNOSIS — Z7984 Long term (current) use of oral hypoglycemic drugs: Secondary | ICD-10-CM | POA: Insufficient documentation

## 2017-08-22 DIAGNOSIS — A0472 Enterocolitis due to Clostridium difficile, not specified as recurrent: Secondary | ICD-10-CM | POA: Diagnosis not present

## 2017-08-22 DIAGNOSIS — I10 Essential (primary) hypertension: Secondary | ICD-10-CM | POA: Diagnosis not present

## 2017-08-22 HISTORY — DX: Unspecified asthma, uncomplicated: J45.909

## 2017-08-22 LAB — URINALYSIS, COMPLETE (UACMP) WITH MICROSCOPIC
BACTERIA UA: NONE SEEN
Bilirubin Urine: NEGATIVE
GLUCOSE, UA: NEGATIVE mg/dL
HGB URINE DIPSTICK: NEGATIVE
KETONES UR: NEGATIVE mg/dL
NITRITE: NEGATIVE
PROTEIN: 30 mg/dL — AB
Specific Gravity, Urine: 1.02 (ref 1.005–1.030)
pH: 6 (ref 5.0–8.0)

## 2017-08-22 LAB — CBC
HEMATOCRIT: 38.1 % (ref 35.0–47.0)
HEMOGLOBIN: 12.6 g/dL (ref 12.0–16.0)
MCH: 25.5 pg — AB (ref 26.0–34.0)
MCHC: 33 g/dL (ref 32.0–36.0)
MCV: 77.4 fL — ABNORMAL LOW (ref 80.0–100.0)
Platelets: 307 10*3/uL (ref 150–440)
RBC: 4.92 MIL/uL (ref 3.80–5.20)
RDW: 15.2 % — ABNORMAL HIGH (ref 11.5–14.5)
WBC: 10.1 10*3/uL (ref 3.6–11.0)

## 2017-08-22 LAB — COMPREHENSIVE METABOLIC PANEL
ALK PHOS: 86 U/L (ref 38–126)
ALT: 29 U/L (ref 14–54)
AST: 29 U/L (ref 15–41)
Albumin: 3.8 g/dL (ref 3.5–5.0)
Anion gap: 11 (ref 5–15)
BILIRUBIN TOTAL: 0.4 mg/dL (ref 0.3–1.2)
BUN: 17 mg/dL (ref 6–20)
CALCIUM: 9.4 mg/dL (ref 8.9–10.3)
CO2: 25 mmol/L (ref 22–32)
Chloride: 103 mmol/L (ref 101–111)
Creatinine, Ser: 0.78 mg/dL (ref 0.44–1.00)
GFR calc Af Amer: >60 mL/min (ref >60)
GLUCOSE: 87 mg/dL (ref 65–99)
POTASSIUM: 2.9 mmol/L — AB (ref 3.5–5.1)
Sodium: 139 mmol/L (ref 135–145)
Total Protein: 7.3 g/dL (ref 6.5–8.1)

## 2017-08-22 LAB — LIPASE, BLOOD: Lipase: 24 U/L (ref 11–51)

## 2017-08-22 MED ORDER — IOPAMIDOL (ISOVUE-300) INJECTION 61%
100.0000 mL | Freq: Once | INTRAVENOUS | Status: AC | PRN
  Administered 2017-08-23: 100 mL via INTRAVENOUS

## 2017-08-22 MED ORDER — POTASSIUM CHLORIDE 10 MEQ/100ML IV SOLN
10.0000 meq | Freq: Once | INTRAVENOUS | Status: AC
  Administered 2017-08-23: 10 meq via INTRAVENOUS
  Filled 2017-08-22: qty 100

## 2017-08-22 MED ORDER — SODIUM CHLORIDE 0.9 % IV BOLUS (SEPSIS)
500.0000 mL | Freq: Once | INTRAVENOUS | Status: AC
  Administered 2017-08-22: 500 mL via INTRAVENOUS

## 2017-08-22 MED ORDER — HYDROCODONE-ACETAMINOPHEN 5-325 MG PO TABS
2.0000 | ORAL_TABLET | Freq: Once | ORAL | Status: AC
  Administered 2017-08-22: 2 via ORAL
  Filled 2017-08-22: qty 2

## 2017-08-22 MED ORDER — ONDANSETRON HCL 4 MG/2ML IJ SOLN
4.0000 mg | Freq: Once | INTRAMUSCULAR | Status: AC
  Administered 2017-08-22: 4 mg via INTRAVENOUS
  Filled 2017-08-22: qty 2

## 2017-08-22 MED ORDER — POTASSIUM CHLORIDE ER 10 MEQ PO TBCR: 10.0000 meq | EXTENDED_RELEASE_TABLET | Freq: Every day | ORAL | 0 refills | Status: DC

## 2017-08-22 MED ORDER — POTASSIUM CHLORIDE 20 MEQ PO PACK
PACK | ORAL | Status: AC
  Filled 2017-08-22: qty 1

## 2017-08-22 MED ORDER — IOPAMIDOL (ISOVUE-300) INJECTION 61%
30.0000 mL | Freq: Once | INTRAVENOUS | Status: AC
  Administered 2017-08-22: 30 mL via ORAL

## 2017-08-22 MED ORDER — POTASSIUM CHLORIDE CRYS ER 20 MEQ PO TBCR
40.0000 meq | EXTENDED_RELEASE_TABLET | Freq: Once | ORAL | Status: DC
  Filled 2017-08-22: qty 2

## 2017-08-22 MED ORDER — POTASSIUM CHLORIDE 20 MEQ/15ML (10%) PO SOLN
40.0000 meq | Freq: Once | ORAL | Status: AC
  Administered 2017-08-23: 40 meq via ORAL
  Filled 2017-08-22 (×2): qty 30

## 2017-08-22 MED ORDER — METRONIDAZOLE 500 MG PO TABS: 500.0000 mg | ORAL_TABLET | Freq: Three times a day (TID) | ORAL | 0 refills | Status: DC

## 2017-08-22 MED ORDER — CIPROFLOXACIN HCL 500 MG PO TABS: 500.0000 mg | ORAL_TABLET | Freq: Two times a day (BID) | ORAL | 0 refills | Status: DC

## 2017-08-22 NOTE — ED Notes (Signed)
EDP notified pt states she can not drink anymore contrast, pt drank a bottle and half. EDP states pt can go to CT, CT notified.

## 2017-08-22 NOTE — Discharge Instructions (Addendum)
? ?  Please return to the emergency room right away if you are to develop a fever, severe nausea, your pain becomes severe or worsens, you are unable to keep food down, begin vomiting any dark or bloody fluid, you develop any dark or bloody stools, feel dehydrated, or other new concerns or symptoms arise. ? ?

## 2017-08-22 NOTE — ED Notes (Signed)
Pt unable to tolerate PO potassium, threw it back up. EDP notified.

## 2017-08-22 NOTE — ED Provider Notes (Signed)
[facility] Emergency Department Provider Note  ____________________________________________   First MD Initiated Contact with Patient 08/22/17 2145     (approximate)  I have reviewed the triage vital signs and the nursing notes.   HISTORY  Chief Complaint Abdominal Pain   HPI Kerri Steele is a 56 y.o. female here for evaluation of lower abdominal pain and diarrhea  Patient reports for about 5 days should experiencing multiple loose watery nonbloody stools daily. She reports it feels similar to when she had "colitis" about a year ago. She does relate a history of having an abscess on her scalp that was drained a few weeks ago and she was treated with clindamycin for that. Denies fevers or chills. Does report moderate to severe pain in the lower abdomen is crampy in nature. No vaginal discharge or bleeding. Denies any risk of pregnancy, postmenopausal.  has prescription for hydrocodone at home, has been taking 1 tablet every 6-8 hours with slight relief for the last few days.  No vomiting, reports only minimal nausea. No upper abdominal pain or chest pain.   Past Medical History:  Diagnosis Date  . Anginal pain (HCC)   . Anxiety   . COPD (chronic obstructive pulmonary disease) (HCC)   . Depression   . Diabetes mellitus without complication (HCC)   . GERD (gastroesophageal reflux disease)   . Hypertension   . Hypothyroidism   . Pneumonia   . Shortness of breath dyspnea   . Sleep apnea     Patient Active Problem List   Diagnosis Date Noted  . Acute colitis 07/30/2016  . Breath shortness 10/18/2012    Past Surgical History:  Procedure Laterality Date  . ANKLE SURGERY    . BLADDER SURGERY    . CARDIAC CATHETERIZATION Left 08/23/2015   Procedure: Left Heart Cath and Coronary Angiography;  Surgeon: Dalia Heading, MD;  Location: [facility] INVASIVE CV LAB;  Service: Cardiovascular;  Laterality: Left;  . ESOPHAGEAL DILATION    . TUBAL LIGATION    .  WRIST SURGERY      Prior to Admission medications   Medication Sig Start Date End Date Taking? Authorizing Provider  albuterol-ipratropium (COMBIVENT) 18-103 MCG/ACT inhaler Inhale 2 puffs into the lungs every 4 (four) hours as needed for wheezing or shortness of breath.    [provider]  arformoterol (BROVANA) 15 MCG/2ML NEBU Take 15 mcg by nebulization 2 (two) times daily.    [provider]  atorvastatin (LIPITOR) 20 MG tablet Take 20 mg by mouth daily.    [provider]  ciprofloxacin (CIPRO) 500 MG tablet Take 1 tablet (500 mg total) by mouth 2 (two) times daily. 08/22/17   Sharyn Creamer, MD  DULoxetine (CYMBALTA) 60 MG capsule Take 60 mg by mouth daily.    [provider]  gabapentin (NEURONTIN) 800 MG tablet Take 800 mg by mouth 3 (three) times daily.    [provider]  glipiZIDE (GLUCOTROL XL) 10 MG 24 hr tablet Take 10 mg by mouth daily with breakfast.    [provider]  HYDROcodone-acetaminophen (NORCO) 5-325 MG tablet Take 1 tablet by mouth every 6 (six) hours as needed for severe pain. 08/01/17   Merrily Brittle, MD  HYDROcodone-acetaminophen (NORCO/VICODIN) 5-325 MG tablet Take 1 tablet by mouth every 4 (four) hours as needed for moderate pain. Patient taking differently: Take 1 tablet by mouth at bedtime.  10/24/15   Cuthriell, Delorise Royals, PA-C  liraglutide (VICTOZA) 18 MG/3ML SOPN Inject 1.2 mg into  the skin at bedtime. 01/01/17   [provider]  magnesium oxide (MAG-OX) 400 MG tablet Take 400 mg by mouth 2 (two) times daily.     [provider]  methocarbamol (ROBAXIN) 750 MG tablet Take 750 mg by mouth 3 (three) times daily.     [provider]  metroNIDAZOLE (FLAGYL) 500 MG tablet Take 1 tablet (500 mg total) by mouth 3 (three) times daily. 08/22/17   Sharyn Creamer, MD  omeprazole (PRILOSEC) 20 MG capsule Take 20 mg by mouth daily.    [provider]  pantoprazole (PROTONIX) 20 MG tablet  Take 1 tablet (20 mg total) by mouth daily. 02/16/17 02/16/18  Jene Every, MD  potassium chloride (K-DUR) 10 MEQ tablet Take 1 tablet (10 mEq total) by mouth daily. 08/22/17   Sharyn Creamer, MD  promethazine (PHENERGAN) 12.5 MG tablet Take 1 tablet (12.5 mg total) by mouth every 6 (six) hours as needed for nausea or vomiting. 02/16/17   Jene Every, MD  sucralfate (CARAFATE) 1 g tablet Take 1 tablet (1 g total) by mouth 4 (four) times daily. 02/16/17 02/16/18  Jene Every, MD  tiotropium (SPIRIVA) 18 MCG inhalation capsule Place 18 mcg into inhaler and inhale daily.    [provider]  traMADol (ULTRAM) 50 MG tablet Take 50 mg by mouth every 6 (six) hours as needed for moderate pain.    [provider]  verapamil (CALAN-SR) 120 MG CR tablet Take 120 mg by mouth daily. 02/06/17   [provider]    Allergies Metformin and related; Morphine and related; and Sulfa antibiotics  Family History  Problem Relation Age of Onset  . Heart disease Father   . Hypertension Mother   . Diabetes Mother   . Lupus Mother   . Atrial fibrillation Mother     Social History Social History  Substance Use Topics  . Smoking status: Former Games developer  . Smokeless tobacco: Never Used  . Alcohol use No    Review of Systems Constitutional: No fever/chills Eyes: No visual changes. ENT: No sore throat. Cardiovascular: Denies chest pain. Respiratory: Denies shortness of breath. Gastrointestinal: No vomiting.  No constipation. Genitourinary: Negative for dysuria. Musculoskeletal: Negative for back pain. Skin: Negative for rash. Neurological: Negative for headaches, focal weakness or numbness.    ____________________________________________   PHYSICAL EXAM:  VITAL SIGNS: ED Triage Vitals [08/22/17 1756]  Enc Vitals Group     BP 134/74     Pulse Rate 97     Resp 16     Temp 98.7 F (37.1 C)     Temp Source Oral     SpO2 96 %     Weight      Height      Head Circumference        Peak Flow      Pain Score 10     Pain Loc      Pain Edu?      Excl. in GC?     Constitutional: Alert and oriented. Well appearing and in no acute distress. Eyes: Conjunctivae are normal. Head: Atraumatic. Nose: No congestion/rhinnorhea. Mouth/Throat: Mucous membranes are moist. Neck: No stridor.   Cardiovascular: Normal rate, regular rhythm. Grossly normal heart sounds.  Good peripheral circulation. Respiratory: Normal respiratory effort.  No retractions. Lungs CTAB. Gastrointestinal: Soft and nontenderthe upper quadrants, however lower abdomen demonstrates moderate pain that is reproducible primarily in the midline and left lower quadrant without frank rebound or guarding. No distention. Musculoskeletal: No lower extremity tenderness  nor edema. Neurologic:  Normal speech and language. No gross focal neurologic deficits are appreciated.  Skin:  Skin is warm, dry and intact. No rash noted. Psychiatric: Mood and affect are normal. Speech and behavior are normal.  ____________________________________________   LABS (all labs ordered are listed, but only abnormal results are displayed)  Labs Reviewed  COMPREHENSIVE METABOLIC PANEL - Abnormal; Notable for the following:       Result Value   Potassium 2.9 (*)    All other components within normal limits  CBC - Abnormal; Notable for the following:    MCV 77.4 (*)    MCH 25.5 (*)    RDW 15.2 (*)    All other components within normal limits  URINALYSIS, COMPLETE (UACMP) WITH MICROSCOPIC - Abnormal; Notable for the following:    Color, Urine YELLOW (*)    APPearance CLEAR (*)    Protein, ur 30 (*)    Leukocytes, UA TRACE (*)    Squamous Epithelial / LPF 0-5 (*)    All other components within normal limits  C DIFFICILE QUICK SCREEN W PCR REFLEX  GASTROINTESTINAL PANEL BY PCR, STOOL (REPLACES STOOL CULTURE)  LIPASE, BLOOD    ____________________________________________  EKG   ____________________________________________  RADIOLOGY  CT pending at sign out ____________________________________________   PROCEDURES  Procedure(s) performed: None  Procedures  Critical Care performed: No  ____________________________________________   INITIAL IMPRESSION / ASSESSMENT AND PLAN / ED COURSE  Pertinent labs & imaging results that were available during my care of the patient were reviewed by me and considered in my medical decision making (see chart for details).  Differential diagnosis includes but is not limited to, abdominal perforation, aortic dissection, cholecystitis, appendicitis, diverticulitis, colitis, esophagitis/gastritis, kidney stone, pyelonephritis, urinary tract infection, aortic aneurysm. All are considered in decision and treatment plan. Based upon the patient's presentation and risk factors, given the focality and lower pain with loose stool sounds consistent with likely colitis but given the patient's age and abdominal examination findings AND a CT scan to further evaluate. Also given her recent antibiotic use, I have asked for her to provide stool cultures in the event she could potentially have C. Difficile.  Anticipate if the patient is discharged home that we would place her on Flagyl and a precaution in the event she could've C. difficile given the risk factor of recent clindamycin treatment.   ----------------------------------------- 11:43 PM on 08/22/2017 -----------------------------------------  Ongoing care assigned to Dr. Dolores Frame. Patient stable hemodynamics, reassuring labs but abdominal exam concerning for possible colitis or intra-abdominal etiology, exclude perforation, diverticulitis, etc. Moderate risk for C. difficile, and if CT is negative or demonstrate diverticulitis and would likely discharge the patient with prescription for Cipro and Flagyl assuming CT does not have  concerning findings.       ____________________________________________   FINAL CLINICAL IMPRESSION(S) / ED DIAGNOSES  Final diagnoses:  Colitis  Lower abdominal pain  Hypokalemia due to loss of potassium      NEW MEDICATIONS STARTED DURING THIS VISIT:  New Prescriptions   CIPROFLOXACIN (CIPRO) 500 MG TABLET    Take 1 tablet (500 mg total) by mouth 2 (two) times daily.   METRONIDAZOLE (FLAGYL) 500 MG TABLET    Take 1 tablet (500 mg total) by mouth 3 (three) times daily.   POTASSIUM CHLORIDE (K-DUR) 10 MEQ TABLET    Take 1 tablet (10 mEq total) by mouth daily.     Note:  This document was prepared using Dragon voice recognition software and may include unintentional  dictation errors.     Sharyn Creamer, MD 08/22/17 [phone]

## 2017-08-22 NOTE — ED Triage Notes (Signed)
Pt to the ED via POV for abdominal pain. Pt states that she has had abdominal pain and diarrhea since Tuesday. Pt states that the pain has gotten worse since yesterday. Pt states that her pain is worse when she tries to eat. Pt denies vomiting. Pt states that last year she was hospitalized with colitis and this feels similar. Pt denies passing in blood in stool. Pt in NAD at this time.

## 2017-08-23 LAB — C DIFFICILE QUICK SCREEN W PCR REFLEX
C DIFFICILE (CDIFF) INTERP: DETECTED
C Diff antigen: POSITIVE — AB
C Diff toxin: POSITIVE — AB

## 2017-08-23 LAB — GASTROINTESTINAL PANEL BY PCR, STOOL (REPLACES STOOL CULTURE)
ADENOVIRUS F40/41: NOT DETECTED
ASTROVIRUS: NOT DETECTED
CAMPYLOBACTER SPECIES: NOT DETECTED
CRYPTOSPORIDIUM: NOT DETECTED
Cyclospora cayetanensis: NOT DETECTED
ENTEROPATHOGENIC E COLI (EPEC): NOT DETECTED
ENTEROTOXIGENIC E COLI (ETEC): NOT DETECTED
Entamoeba histolytica: NOT DETECTED
Enteroaggregative E coli (EAEC): NOT DETECTED
Giardia lamblia: NOT DETECTED
Norovirus GI/GII: NOT DETECTED
PLESIMONAS SHIGELLOIDES: NOT DETECTED
ROTAVIRUS A: NOT DETECTED
SHIGA LIKE TOXIN PRODUCING E COLI (STEC): NOT DETECTED
Salmonella species: NOT DETECTED
Sapovirus (I, II, IV, and V): NOT DETECTED
Shigella/Enteroinvasive E coli (EIEC): NOT DETECTED
Vibrio cholerae: NOT DETECTED
Vibrio species: NOT DETECTED
Yersinia enterocolitica: NOT DETECTED

## 2017-08-23 MED ORDER — OXYCODONE-ACETAMINOPHEN 5-325 MG PO TABS
1.0000 | ORAL_TABLET | Freq: Once | ORAL | Status: AC
  Administered 2017-08-23: 1 via ORAL
  Filled 2017-08-23: qty 1

## 2017-08-23 MED ORDER — METRONIDAZOLE 500 MG PO TABS: 500.0000 mg | ORAL_TABLET | Freq: Three times a day (TID) | ORAL | 0 refills | Status: DC

## 2017-08-23 MED ORDER — ONDANSETRON 4 MG PO TBDP: 4.0000 mg | ORAL_TABLET | Freq: Three times a day (TID) | ORAL | 0 refills | Status: DC | PRN

## 2017-08-23 MED ORDER — ONDANSETRON 4 MG PO TBDP
4.0000 mg | ORAL_TABLET | Freq: Once | ORAL | Status: AC
  Administered 2017-08-23: 4 mg via ORAL
  Filled 2017-08-23: qty 1

## 2017-08-23 MED ORDER — OXYCODONE-ACETAMINOPHEN 5-325 MG PO TABS: 1.0000 | ORAL_TABLET | ORAL | 0 refills | Status: DC | PRN

## 2017-08-23 NOTE — ED Notes (Signed)
Pharmacy called again about potassium solution
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
## 2017-08-23 NOTE — ED Notes (Signed)
Pharmacy called about potassium solution, states they will send it to ED

## 2017-08-23 NOTE — ED Notes (Signed)

## 2019-03-03 DIAGNOSIS — R002 Palpitations: Secondary | ICD-10-CM | POA: Insufficient documentation

## 2019-06-11 ENCOUNTER — Other Ambulatory Visit: Payer: Self-pay | Admitting: Physical Medicine and Rehabilitation

## 2019-06-11 DIAGNOSIS — M5412 Radiculopathy, cervical region: Secondary | ICD-10-CM

## 2019-07-08 ENCOUNTER — Ambulatory Visit
Admission: RE | Admit: 2019-07-08 | Discharge: 2019-07-08 | Disposition: A | Discharge status: Home / Self Care | Payer: Medicare Other | Source: Ambulatory Visit | Attending: Physical Medicine and Rehabilitation | Admitting: Physical Medicine and Rehabilitation

## 2019-07-08 ENCOUNTER — Other Ambulatory Visit: Payer: Self-pay

## 2019-07-08 DIAGNOSIS — M5412 Radiculopathy, cervical region: Secondary | ICD-10-CM

## 2019-09-06 ENCOUNTER — Emergency Department: Payer: Medicare Other

## 2019-09-06 ENCOUNTER — Other Ambulatory Visit: Payer: Self-pay

## 2019-09-06 ENCOUNTER — Emergency Department
Admission: EM | Admit: 2019-09-06 | Discharge: 2019-09-06 | Disposition: A | Discharge status: Home / Self Care | Payer: Medicare Other | Attending: Emergency Medicine | Admitting: Emergency Medicine

## 2019-09-06 ENCOUNTER — Encounter: Payer: Self-pay | Admitting: Emergency Medicine

## 2019-09-06 DIAGNOSIS — Z7984 Long term (current) use of oral hypoglycemic drugs: Secondary | ICD-10-CM | POA: Diagnosis not present

## 2019-09-06 DIAGNOSIS — R197 Diarrhea, unspecified: Secondary | ICD-10-CM | POA: Diagnosis not present

## 2019-09-06 DIAGNOSIS — I10 Essential (primary) hypertension: Secondary | ICD-10-CM | POA: Insufficient documentation

## 2019-09-06 DIAGNOSIS — Z79899 Other long term (current) drug therapy: Secondary | ICD-10-CM | POA: Insufficient documentation

## 2019-09-06 DIAGNOSIS — N840 Polyp of corpus uteri: Secondary | ICD-10-CM

## 2019-09-06 DIAGNOSIS — J449 Chronic obstructive pulmonary disease, unspecified: Secondary | ICD-10-CM | POA: Insufficient documentation

## 2019-09-06 DIAGNOSIS — Z87891 Personal history of nicotine dependence: Secondary | ICD-10-CM | POA: Diagnosis not present

## 2019-09-06 DIAGNOSIS — E119 Type 2 diabetes mellitus without complications: Secondary | ICD-10-CM | POA: Insufficient documentation

## 2019-09-06 DIAGNOSIS — R1084 Generalized abdominal pain: Secondary | ICD-10-CM | POA: Diagnosis not present

## 2019-09-06 DIAGNOSIS — E039 Hypothyroidism, unspecified: Secondary | ICD-10-CM | POA: Insufficient documentation

## 2019-09-06 DIAGNOSIS — R109 Unspecified abdominal pain: Secondary | ICD-10-CM | POA: Diagnosis present

## 2019-09-06 LAB — URINALYSIS, COMPLETE (UACMP) WITH MICROSCOPIC
Bacteria, UA: NONE SEEN
Bilirubin Urine: NEGATIVE
Glucose, UA: NEGATIVE mg/dL
Hgb urine dipstick: NEGATIVE
Ketones, ur: NEGATIVE mg/dL
Leukocytes,Ua: NEGATIVE
Nitrite: NEGATIVE
Protein, ur: NEGATIVE mg/dL
Specific Gravity, Urine: 1.03 (ref 1.005–1.030)
Squamous Epithelial / HPF: NONE SEEN (ref 0–5)
pH: 7 (ref 5.0–8.0)

## 2019-09-06 LAB — LIPASE, BLOOD: Lipase: 20 U/L (ref 11–51)

## 2019-09-06 LAB — CBC
HCT: 42.7 % (ref 36.0–46.0)
Hemoglobin: 12.9 g/dL (ref 12.0–15.0)
MCH: 24.4 pg — ABNORMAL LOW (ref 26.0–34.0)
MCHC: 30.2 g/dL (ref 30.0–36.0)
MCV: 80.9 fL (ref 80.0–100.0)
Platelets: 375 10*3/uL (ref 150–400)
RBC: 5.28 MIL/uL — ABNORMAL HIGH (ref 3.87–5.11)
RDW: 15.3 % (ref 11.5–15.5)
WBC: 12 10*3/uL — ABNORMAL HIGH (ref 4.0–10.5)
nRBC: 0 % (ref 0.0–0.2)

## 2019-09-06 LAB — C DIFFICILE QUICK SCREEN W PCR REFLEX
C Diff antigen: NEGATIVE
C Diff interpretation: NOT DETECTED
C Diff toxin: NEGATIVE

## 2019-09-06 LAB — COMPREHENSIVE METABOLIC PANEL
ALT: 20 U/L (ref 0–44)
AST: 19 U/L (ref 15–41)
Albumin: 3.8 g/dL (ref 3.5–5.0)
Alkaline Phosphatase: 96 U/L (ref 38–126)
Anion gap: 10 (ref 5–15)
BUN: 14 mg/dL (ref 6–20)
CO2: 23 mmol/L (ref 22–32)
Calcium: 9.6 mg/dL (ref 8.9–10.3)
Chloride: 110 mmol/L (ref 98–111)
Creatinine, Ser: 0.68 mg/dL (ref 0.44–1.00)
GFR calc Af Amer: >60 mL/min (ref >60)
GFR calc non Af Amer: >60 mL/min (ref >60)
Glucose, Bld: 166 mg/dL — ABNORMAL HIGH (ref 70–99)
Potassium: 3.7 mmol/L (ref 3.5–5.1)
Sodium: 143 mmol/L (ref 135–145)
Total Bilirubin: 0.5 mg/dL (ref 0.3–1.2)
Total Protein: 7.3 g/dL (ref 6.5–8.1)

## 2019-09-06 LAB — TROPONIN I (HIGH SENSITIVITY): Troponin I (High Sensitivity): 5 ng/L (ref <18)

## 2019-09-06 MED ORDER — ONDANSETRON 4 MG PO TBDP: 4.0000 mg | ORAL_TABLET | Freq: Three times a day (TID) | ORAL | 0 refills | Status: DC | PRN

## 2019-09-06 MED ORDER — SODIUM CHLORIDE 0.9% FLUSH: 3.0000 mL | Freq: Once | INTRAVENOUS | Status: DC

## 2019-09-06 MED ORDER — IOHEXOL 300 MG/ML  SOLN
100.0000 mL | Freq: Once | INTRAMUSCULAR | Status: AC | PRN
  Administered 2019-09-06: 100 mL via INTRAVENOUS

## 2019-09-06 MED ORDER — DIPHENOXYLATE-ATROPINE 2.5-0.025 MG PO TABS: 1.0000 | ORAL_TABLET | Freq: Four times a day (QID) | ORAL | 0 refills | Status: AC | PRN

## 2019-09-06 MED ORDER — ONDANSETRON HCL 4 MG/2ML IJ SOLN
4.0000 mg | Freq: Once | INTRAMUSCULAR | Status: AC
  Administered 2019-09-06: 4 mg via INTRAVENOUS
  Filled 2019-09-06: qty 2

## 2019-09-06 MED ORDER — HYDROMORPHONE HCL 1 MG/ML IJ SOLN
1.0000 mg | Freq: Once | INTRAMUSCULAR | Status: AC
  Administered 2019-09-06: 1 mg via INTRAVENOUS
  Filled 2019-09-06: qty 1

## 2019-09-06 MED ORDER — SODIUM CHLORIDE 0.9 % IV BOLUS
1000.0000 mL | Freq: Once | INTRAVENOUS | Status: AC
  Administered 2019-09-06: 09:00:00 1000 mL via INTRAVENOUS

## 2019-09-06 MED ORDER — DICYCLOMINE HCL 20 MG PO TABS: 20.0000 mg | ORAL_TABLET | Freq: Three times a day (TID) | ORAL | 0 refills | Status: DC

## 2019-09-06 MED ORDER — KETOROLAC TROMETHAMINE 30 MG/ML IJ SOLN
15.0000 mg | Freq: Once | INTRAMUSCULAR | Status: AC
  Administered 2019-09-06: 12:00:00 15 mg via INTRAVENOUS
  Filled 2019-09-06: qty 1

## 2019-09-06 NOTE — Discharge Instructions (Addendum)
As we discussed, your CT scan was reassuring today. I've prescribed medication to help with nausea, diarrhea, and with abdominal pain. Take as prescribed.  PLEASE NOTE: YOUR CT DID SHOW AN INCIDENTAL FINDING, NOT RELATED TO YOUR ABDOMINAL PAIN, THAT YOU NEED TO FOLLOW-UP WITH YOUR OB-GYN OR PRIMARY DOCTOR FOR -- There is a possible 9 mm polyp in your uterus endometrium (lining); this is likely something that just need to be monitored but it may need to be biopsied or looked at further -- You should follow-up with your OB for this, or call your PCP to set up an appointment with one

## 2019-09-06 NOTE — ED Notes (Signed)

## 2019-09-06 NOTE — ED Triage Notes (Signed)
Says abdominal pain all over since yesterday.  Says nausea , no vomiting.

## 2019-09-06 NOTE — ED Notes (Signed)
Patient transported to CT 

## 2019-09-06 NOTE — ED Provider Notes (Signed)
[facility] Emergency Department Provider Note  ____________________________________________   First MD Initiated Contact with Patient 09/06/19 [phone]     (approximate)  I have reviewed the triage vital signs and the nursing notes.   HISTORY  Chief Complaint Abdominal Pain    HPI Kerri Steele is a 58 y.o. female with past medical history as below including COPD, diabetes, prior colitis, here with diffuse abdominal pain.  The patient states that starting yesterday, she developed progressively worsening, aching, gnawing, lower and diffuse abdominal pain.  The pain has gotten progressively worsened since around 2 AM.  She had was unable to sleep.  She has had associated nausea.  She is had associated watery diarrhea.  Denies any blood in her diarrhea.  No vomiting.  No fevers or chills.  No suspicious food intake.  No known recent sick contacts or coronavirus exposures.  Symptoms feel similar to her previous episode of colitis.  Denies any recent colonoscopies. No recent antibiotic use.        Past Medical History:  Diagnosis Date  . Anginal pain (HCC)   . Anxiety   . Asthma   . COPD (chronic obstructive pulmonary disease) (HCC)   . Depression   . Diabetes mellitus without complication (HCC)   . GERD (gastroesophageal reflux disease)   . Hypertension   . Hypothyroidism   . Pneumonia   . Shortness of breath dyspnea   . Sleep apnea     Patient Active Problem List   Diagnosis Date Noted  . Acute colitis 07/30/2016  . Breath shortness 10/18/2012    Past Surgical History:  Procedure Laterality Date  . ANKLE SURGERY    . BLADDER SURGERY    . CARDIAC CATHETERIZATION Left 08/23/2015   Procedure: Left Heart Cath and Coronary Angiography;  Surgeon: Dalia Heading, MD;  Location: [facility] INVASIVE CV LAB;  Service: Cardiovascular;  Laterality: Left;  . ESOPHAGEAL DILATION    . TUBAL LIGATION    . WRIST SURGERY      Prior to Admission medications    Medication Sig Start Date End Date Taking? Authorizing Provider  albuterol-ipratropium (COMBIVENT) 18-103 MCG/ACT inhaler Inhale 2 puffs into the lungs every 4 (four) hours as needed for wheezing or shortness of breath.    [provider]  arformoterol (BROVANA) 15 MCG/2ML NEBU Take 15 mcg by nebulization 2 (two) times daily.    [provider]  atorvastatin (LIPITOR) 20 MG tablet Take 20 mg by mouth daily.    [provider]  ciprofloxacin (CIPRO) 500 MG tablet Take 1 tablet (500 mg total) by mouth 2 (two) times daily. 08/22/17   Sharyn Creamer, MD  dicyclomine (BENTYL) 20 MG tablet Take 1 tablet (20 mg total) by mouth 4 (four) times daily -  before meals and at bedtime for 5 days. 09/06/19 09/11/19  Shaune Pollack, MD  diphenoxylate-atropine (LOMOTIL) 2.5-0.025 MG tablet Take 1 tablet by mouth 4 (four) times daily as needed for up to 3 days for diarrhea or loose stools. 09/06/19 09/09/19  Shaune Pollack, MD  DULoxetine (CYMBALTA) 60 MG capsule Take 60 mg by mouth daily.    [provider]  gabapentin (NEURONTIN) 800 MG tablet Take 800 mg by mouth 3 (three) times daily.    [provider]  glipiZIDE (GLUCOTROL XL) 10 MG 24 hr tablet Take 10 mg by mouth daily with breakfast.    [provider]  HYDROcodone-acetaminophen (NORCO) 5-325 MG tablet Take 1 tablet by mouth every 6 (six) hours  as needed for severe pain. 08/01/17   Darel Hong, MD  HYDROcodone-acetaminophen (NORCO/VICODIN) 5-325 MG tablet Take 1 tablet by mouth every 4 (four) hours as needed for moderate pain. Patient taking differently: Take 1 tablet by mouth at bedtime.  10/24/15   Cuthriell, Charline Bills, PA-C  liraglutide (VICTOZA) 18 MG/3ML SOPN Inject 1.2 mg into the skin at bedtime. 01/01/17   [provider]  magnesium oxide (MAG-OX) 400 MG tablet Take 400 mg by mouth 2 (two) times daily.     [provider]  methocarbamol (ROBAXIN) 750 MG tablet Take 750 mg by  mouth 3 (three) times daily.     [provider]  metroNIDAZOLE (FLAGYL) 500 MG tablet Take 1 tablet (500 mg total) by mouth 3 (three) times daily. 08/22/17   Delman Kitten, MD  metroNIDAZOLE (FLAGYL) 500 MG tablet Take 1 tablet (500 mg total) by mouth 3 (three) times daily. 08/23/17   Paulette Blanch, MD  omeprazole (PRILOSEC) 20 MG capsule Take 20 mg by mouth daily.    [provider]  ondansetron (ZOFRAN ODT) 4 MG disintegrating tablet Take 1 tablet (4 mg total) by mouth every 8 (eight) hours as needed for nausea or vomiting. 09/06/19   Duffy Bruce, MD  oxyCODONE-acetaminophen (ROXICET) 5-325 MG tablet Take 1 tablet by mouth every 4 (four) hours as needed for severe pain. 08/23/17   Paulette Blanch, MD  pantoprazole (PROTONIX) 20 MG tablet Take 1 tablet (20 mg total) by mouth daily. 02/16/17 02/16/18  Lavonia Drafts, MD  potassium chloride (K-DUR) 10 MEQ tablet Take 1 tablet (10 mEq total) by mouth daily. 08/22/17   Delman Kitten, MD  promethazine (PHENERGAN) 12.5 MG tablet Take 1 tablet (12.5 mg total) by mouth every 6 (six) hours as needed for nausea or vomiting. 02/16/17   Lavonia Drafts, MD  sucralfate (CARAFATE) 1 g tablet Take 1 tablet (1 g total) by mouth 4 (four) times daily. 02/16/17 02/16/18  Lavonia Drafts, MD  tiotropium (SPIRIVA) 18 MCG inhalation capsule Place 18 mcg into inhaler and inhale daily.    [provider]  traMADol (ULTRAM) 50 MG tablet Take 50 mg by mouth every 6 (six) hours as needed for moderate pain.    [provider]  verapamil (CALAN-SR) 120 MG CR tablet Take 120 mg by mouth daily. 02/06/17   [provider]    Allergies Metformin and related, Morphine and related, and Sulfa antibiotics  Family History  Problem Relation Age of Onset  . Heart disease Father   . Hypertension Mother   . Diabetes Mother   . Lupus Mother   . Atrial fibrillation Mother     Social History Social History   Tobacco Use  . Smoking status: Former  Research scientist (life sciences)  . Smokeless tobacco: Never Used  Substance Use Topics  . Alcohol use: No  . Drug use: No    Review of Systems  Review of Systems  Constitutional: Positive for fatigue. Negative for fever.  HENT: Negative for congestion and sore throat.   Eyes: Negative for visual disturbance.  Respiratory: Negative for cough and shortness of breath.   Cardiovascular: Negative for chest pain.  Gastrointestinal: Positive for abdominal pain, diarrhea and nausea. Negative for vomiting.  Genitourinary: Negative for flank pain.  Musculoskeletal: Negative for back pain and neck pain.  Skin: Negative for rash and wound.  Neurological: Positive for weakness.  All other systems reviewed and are negative.    ____________________________________________  PHYSICAL EXAM:      VITAL  SIGNS: ED Triage Vitals  Enc Vitals Group     BP 09/06/19 0841 (!) 148/98     Pulse Rate 09/06/19 0841 97     Resp 09/06/19 0841 18     Temp 09/06/19 0841 98.5 F (36.9 C)     Temp Source 09/06/19 0841 Oral     SpO2 09/06/19 0841 98 %     Weight 09/06/19 0835 150 lb (68 kg)     Height 09/06/19 0835 5' (1.524 m)     Head Circumference --      Peak Flow --      Pain Score 09/06/19 0834 10     Pain Loc --      Pain Edu? --      Excl. in GC? --      Physical Exam Vitals signs and nursing note reviewed.  Constitutional:      General: She is not in acute distress.    Appearance: She is well-developed.  HENT:     Head: Normocephalic and atraumatic.  Eyes:     Conjunctiva/sclera: Conjunctivae normal.  Neck:     Musculoskeletal: Neck supple.  Cardiovascular:     Rate and Rhythm: Normal rate and regular rhythm.     Heart sounds: Normal heart sounds. No murmur. No friction rub.  Pulmonary:     Effort: Pulmonary effort is normal. No respiratory distress.     Breath sounds: Normal breath sounds. No wheezing or rales.  Abdominal:     General: There is no distension.     Palpations: Abdomen is soft.      Tenderness: There is generalized abdominal tenderness and tenderness in the periumbilical area and left lower quadrant. There is no guarding or rebound.  Skin:    General: Skin is warm.     Capillary Refill: Capillary refill takes less than 2 seconds.  Neurological:     Mental Status: She is alert and oriented to person, place, and time.     Motor: No abnormal muscle tone.       ____________________________________________   LABS (all labs ordered are listed, but only abnormal results are displayed)  Labs Reviewed  COMPREHENSIVE METABOLIC PANEL - Abnormal; Notable for the following components:      Result Value   Glucose, Bld 166 (*)    All other components within normal limits  CBC - Abnormal; Notable for the following components:   WBC 12.0 (*)    RBC 5.28 (*)    MCH 24.4 (*)    All other components within normal limits  URINALYSIS, COMPLETE (UACMP) WITH MICROSCOPIC - Abnormal; Notable for the following components:   Color, Urine COLORLESS (*)    APPearance CLEAR (*)    All other components within normal limits  C DIFFICILE QUICK SCREEN W PCR REFLEX  GI PATHOGEN PANEL BY PCR, STOOL  LIPASE, BLOOD  TROPONIN I (HIGH SENSITIVITY)    ____________________________________________  EKG: Sinus tachycardia, ventricular rate 102.  QRS 74.  Nonspecific ST changes diffusely, possible ischemic.  No ST elevations. ________________________________________  RADIOLOGY All imaging, including plain films, CT scans, and ultrasounds, independently reviewed by me, and interpretations confirmed via formal radiology reads.  ED MD interpretation:   CT A/P: No acute abnormality, bilateral hydro is chronic, endometrial polyp  Official radiology report(s): Ct Abdomen Pelvis W Contrast  Result Date: 09/06/2019 CLINICAL DATA:  Abdominal pain since yesterday. Nausea without vomiting. EXAM: CT ABDOMEN AND PELVIS WITH CONTRAST TECHNIQUE: Multidetector CT imaging of the abdomen and pelvis was  performed using the standard protocol following bolus administration of intravenous contrast. CONTRAST:  OMNIPAQUE IOHEXOL 300 MG/ML  SOLN COMPARISON:  CT scan 08/23/2017 FINDINGS: Lower chest: The heart is normal in size. No pericardial effusion. The distal esophagus is grossly normal. The lung bases are clear. Hepatobiliary: A few tiny scattered low-attenuation areas in the liver are stable and likely benign cysts. No worrisome hepatic lesions or intrahepatic biliary dilatation. The gallbladder is unremarkable. No common bile duct dilatation. Pancreas: No mass, inflammation or ductal dilatation. Spleen: Normal size.  No focal lesions. Adrenals/Urinary Tract: The adrenal glands are unremarkable and stable. Stable bilateral hydroureteronephrosis. It appears that the patient has had prior ureteral reimplantation surgery. No obvious complicating features. No worrisome renal or bladder lesions. Stomach/Bowel: The stomach, duodenum, small bowel and colon are grossly normal. No acute inflammatory changes, mass lesions or obstructive findings. The terminal ileum and appendix are normal. Vascular/Lymphatic: Age advanced atherosclerotic calcifications involving the aorta and iliac arteries but no aneurysm or dissection. The branch vessels are patent. The major venous structures are patent. No mesenteric or retroperitoneal mass or adenopathy. Small scattered lymph nodes are noted. Reproductive: There is a 9 mm enhancing lesion in the endometrium. This could be an endometrial polyp. Hysterosonography may be helpful for further evaluation. Both ovaries appear normal.  Tubal ligation clips are noted. Other: No pelvic mass or free pelvic fluid collections. No pelvic adenopathy. No inguinal adenopathy. Stable periumbilical abdominal wall hernia containing fat Musculoskeletal: No significant bony findings. Moderate degenerative disc disease noted at L5-S1. IMPRESSION: 1. No acute abdominal/pelvic findings, mass lesions or  adenopathy. 2. 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 3. Small enhancing endometrial lesion, likely polyp. Recommend GYN consultation and possible hysterosonography. Electronically Signed   By: Rudie Meyer M.D.   On: 09/06/2019 10:39    ____________________________________________  PROCEDURES   Procedure(s) performed (including Critical Care):  Procedures  ____________________________________________  INITIAL IMPRESSION / MDM / ASSESSMENT AND PLAN / ED COURSE  As part of my medical decision making, I reviewed the following data within the electronic MEDICAL RECORD NUMBER Notes from prior ED visits and [place] Controlled Substance Database      *Kerri Steele was evaluated in Emergency Department on 09/06/2019 for the symptoms described in the history of present illness. She was evaluated in the context of the global COVID-19 pandemic, which necessitated consideration that the patient might be at risk for infection with the SARS-CoV-2 virus that causes COVID-19. Institutional protocols and algorithms that pertain to the evaluation of patients at risk for COVID-19 are in a state of rapid change based on information released by regulatory bodies including the CDC and federal and state organizations. These policies and algorithms were followed during the patient's care in the ED.  Some ED evaluations and interventions may be delayed as a result of limited staffing during the pandemic.*      Medical Decision Making:  58 yo F here with abd pain, diarrhea, nausea. No fever. Diffuse TTP noted on exam but no focal TTP or rebound/guarding. Labs reviewed and are reassuring. Mild leukocytosis is non-specific, and LFTs, renal function wnl. Lipase normal. Given TTP on exam and h/o colitis, CT scan obtained and is negative. C. Diff negative. Suspect viral GI illness vs early mild colitis. No signs of invasive bacterial infection. Will tx supportively, refer for outpt  follow-up.  Incidental note made of endometrial polyp - discussed with pt and referred to her PCP and OB.  ____________________________________________  FINAL  CLINICAL IMPRESSION(S) / ED DIAGNOSES  Final diagnoses:  Generalized abdominal pain  Diarrhea of presumed infectious origin  Endometrial polyp     MEDICATIONS GIVEN DURING THIS VISIT:  Medications  HYDROmorphone (DILAUDID) injection 1 mg (1 mg Intravenous Given 09/06/19 0929)  sodium chloride 0.9 % bolus 1,000 mL (0 mLs Intravenous Stopped 09/06/19 1159)  ondansetron (ZOFRAN) injection 4 mg (4 mg Intravenous Given 09/06/19 0929)  iohexol (OMNIPAQUE) 300 MG/ML solution 100 mL (100 mLs Intravenous Contrast Given 09/06/19 1015)  HYDROmorphone (DILAUDID) injection 1 mg (1 mg Intravenous Given 09/06/19 1147)  ketorolac (TORADOL) 30 MG/ML injection 15 mg (15 mg Intravenous Given 09/06/19 1147)     ED Discharge Orders         Ordered    dicyclomine (BENTYL) 20 MG tablet  3 times daily before meals & bedtime     09/06/19 1232    ondansetron (ZOFRAN ODT) 4 MG disintegrating tablet  Every 8 hours PRN     09/06/19 1232    diphenoxylate-atropine (LOMOTIL) 2.5-0.025 MG tablet  4 times daily PRN     09/06/19 1232           Note:  This document was prepared using Dragon voice recognition software and may include unintentional dictation errors.   Shaune Pollack, MD 09/06/19 [phone]

## 2019-09-08 LAB — GI PATHOGEN PANEL BY PCR, STOOL

## 2020-03-24 ENCOUNTER — Encounter: Payer: Self-pay | Admitting: Gastroenterology

## 2020-05-23 ENCOUNTER — Ambulatory Visit: Payer: Medicare Other | Admitting: Gastroenterology

## 2020-05-28 ENCOUNTER — Emergency Department: Payer: Medicare Other

## 2020-05-28 ENCOUNTER — Other Ambulatory Visit: Payer: Self-pay

## 2020-05-28 ENCOUNTER — Inpatient Hospital Stay
Admission: EM | Admit: 2020-05-28 | Discharge: 2020-06-01 | DRG: 041 | Disposition: A | Discharge status: Home / Self Care | Payer: Medicare Other | Attending: Internal Medicine | Admitting: Internal Medicine

## 2020-05-28 DIAGNOSIS — G8191 Hemiplegia, unspecified affecting right dominant side: Secondary | ICD-10-CM | POA: Diagnosis present

## 2020-05-28 DIAGNOSIS — I639 Cerebral infarction, unspecified: Secondary | ICD-10-CM

## 2020-05-28 DIAGNOSIS — Z885 Allergy status to narcotic agent status: Secondary | ICD-10-CM | POA: Diagnosis not present

## 2020-05-28 DIAGNOSIS — Z20822 Contact with and (suspected) exposure to covid-19: Secondary | ICD-10-CM | POA: Diagnosis present

## 2020-05-28 DIAGNOSIS — Q2112 Patent foramen ovale: Secondary | ICD-10-CM

## 2020-05-28 DIAGNOSIS — R29810 Facial weakness: Secondary | ICD-10-CM | POA: Diagnosis present

## 2020-05-28 DIAGNOSIS — E785 Hyperlipidemia, unspecified: Secondary | ICD-10-CM | POA: Diagnosis present

## 2020-05-28 DIAGNOSIS — Z794 Long term (current) use of insulin: Secondary | ICD-10-CM | POA: Diagnosis not present

## 2020-05-28 DIAGNOSIS — J449 Chronic obstructive pulmonary disease, unspecified: Secondary | ICD-10-CM | POA: Diagnosis present

## 2020-05-28 DIAGNOSIS — E039 Hypothyroidism, unspecified: Secondary | ICD-10-CM | POA: Diagnosis present

## 2020-05-28 DIAGNOSIS — Z79899 Other long term (current) drug therapy: Secondary | ICD-10-CM | POA: Diagnosis not present

## 2020-05-28 DIAGNOSIS — E876 Hypokalemia: Secondary | ICD-10-CM | POA: Diagnosis present

## 2020-05-28 DIAGNOSIS — Z8249 Family history of ischemic heart disease and other diseases of the circulatory system: Secondary | ICD-10-CM

## 2020-05-28 DIAGNOSIS — Z888 Allergy status to other drugs, medicaments and biological substances status: Secondary | ICD-10-CM

## 2020-05-28 DIAGNOSIS — H5461 Unqualified visual loss, right eye, normal vision left eye: Secondary | ICD-10-CM | POA: Diagnosis present

## 2020-05-28 DIAGNOSIS — K219 Gastro-esophageal reflux disease without esophagitis: Secondary | ICD-10-CM | POA: Diagnosis present

## 2020-05-28 DIAGNOSIS — Z833 Family history of diabetes mellitus: Secondary | ICD-10-CM

## 2020-05-28 DIAGNOSIS — I6300 Cerebral infarction due to thrombosis of unspecified precerebral artery: Principal | ICD-10-CM | POA: Diagnosis present

## 2020-05-28 DIAGNOSIS — F419 Anxiety disorder, unspecified: Secondary | ICD-10-CM | POA: Diagnosis present

## 2020-05-28 DIAGNOSIS — I10 Essential (primary) hypertension: Secondary | ICD-10-CM | POA: Diagnosis present

## 2020-05-28 DIAGNOSIS — Z882 Allergy status to sulfonamides status: Secondary | ICD-10-CM | POA: Diagnosis not present

## 2020-05-28 DIAGNOSIS — G894 Chronic pain syndrome: Secondary | ICD-10-CM | POA: Diagnosis present

## 2020-05-28 DIAGNOSIS — Q211 Atrial septal defect: Secondary | ICD-10-CM

## 2020-05-28 DIAGNOSIS — I4891 Unspecified atrial fibrillation: Secondary | ICD-10-CM

## 2020-05-28 DIAGNOSIS — E1169 Type 2 diabetes mellitus with other specified complication: Secondary | ICD-10-CM | POA: Diagnosis present

## 2020-05-28 DIAGNOSIS — I959 Hypotension, unspecified: Secondary | ICD-10-CM | POA: Diagnosis not present

## 2020-05-28 DIAGNOSIS — F329 Major depressive disorder, single episode, unspecified: Secondary | ICD-10-CM | POA: Diagnosis present

## 2020-05-28 DIAGNOSIS — R29705 NIHSS score 5: Secondary | ICD-10-CM | POA: Diagnosis present

## 2020-05-28 DIAGNOSIS — Z87891 Personal history of nicotine dependence: Secondary | ICD-10-CM

## 2020-05-28 DIAGNOSIS — G4730 Sleep apnea, unspecified: Secondary | ICD-10-CM | POA: Diagnosis present

## 2020-05-28 DIAGNOSIS — Z79891 Long term (current) use of opiate analgesic: Secondary | ICD-10-CM | POA: Diagnosis not present

## 2020-05-28 DIAGNOSIS — I82419 Acute embolism and thrombosis of unspecified femoral vein: Secondary | ICD-10-CM

## 2020-05-28 DIAGNOSIS — Z8673 Personal history of transient ischemic attack (TIA), and cerebral infarction without residual deficits: Secondary | ICD-10-CM

## 2020-05-28 HISTORY — DX: Cerebral infarction, unspecified: I63.9

## 2020-05-28 LAB — GLUCOSE, CAPILLARY
Glucose-Capillary: 97 mg/dL (ref 70–99)
Glucose-Capillary: 128 mg/dL — ABNORMAL HIGH (ref 70–99)

## 2020-05-28 LAB — COMPREHENSIVE METABOLIC PANEL
ALT: 27 U/L (ref 0–44)
AST: 30 U/L (ref 15–41)
Albumin: 4.3 g/dL (ref 3.5–5.0)
Alkaline Phosphatase: 94 U/L (ref 38–126)
Anion gap: 9 (ref 5–15)
BUN: 18 mg/dL (ref 6–20)
CO2: 27 mmol/L (ref 22–32)
Calcium: 9.6 mg/dL (ref 8.9–10.3)
Chloride: 104 mmol/L (ref 98–111)
Creatinine, Ser: 1.01 mg/dL — ABNORMAL HIGH (ref 0.44–1.00)
GFR calc Af Amer: >60 mL/min (ref >60)
GFR calc non Af Amer: >60 mL/min (ref >60)
Glucose, Bld: 108 mg/dL — ABNORMAL HIGH (ref 70–99)
Potassium: 3.4 mmol/L — ABNORMAL LOW (ref 3.5–5.1)
Sodium: 140 mmol/L (ref 135–145)
Total Bilirubin: 0.6 mg/dL (ref 0.3–1.2)
Total Protein: 7.4 g/dL (ref 6.5–8.1)

## 2020-05-28 LAB — APTT: aPTT: 28 seconds (ref 24–36)

## 2020-05-28 LAB — CBC
HCT: 41.1 % (ref 36.0–46.0)
Hemoglobin: 12.7 g/dL (ref 12.0–15.0)
MCH: 24.4 pg — ABNORMAL LOW (ref 26.0–34.0)
MCHC: 30.9 g/dL (ref 30.0–36.0)
MCV: 79 fL — ABNORMAL LOW (ref 80.0–100.0)
Platelets: 347 10*3/uL (ref 150–400)
RBC: 5.2 MIL/uL — ABNORMAL HIGH (ref 3.87–5.11)
RDW: 15.9 % — ABNORMAL HIGH (ref 11.5–15.5)
WBC: 11.1 10*3/uL — ABNORMAL HIGH (ref 4.0–10.5)
nRBC: 0 % (ref 0.0–0.2)

## 2020-05-28 LAB — DIFFERENTIAL
Abs Immature Granulocytes: 0.03 10*3/uL (ref 0.00–0.07)
Basophils Absolute: 0.1 10*3/uL (ref 0.0–0.1)
Basophils Relative: 1 %
Eosinophils Absolute: 0.4 10*3/uL (ref 0.0–0.5)
Eosinophils Relative: 3 %
Immature Granulocytes: 0 %
Lymphocytes Relative: 30 %
Lymphs Abs: 3.4 10*3/uL (ref 0.7–4.0)
Monocytes Absolute: 0.9 10*3/uL (ref 0.1–1.0)
Monocytes Relative: 8 %
Neutro Abs: 6.4 10*3/uL (ref 1.7–7.7)
Neutrophils Relative %: 58 %

## 2020-05-28 LAB — MRSA PCR SCREENING: MRSA by PCR: NEGATIVE

## 2020-05-28 LAB — PROTIME-INR
INR: 0.9 (ref 0.8–1.2)
Prothrombin Time: 11.6 seconds (ref 11.4–15.2)

## 2020-05-28 LAB — SARS CORONAVIRUS 2 BY RT PCR (HOSPITAL ORDER, PERFORMED IN ~~LOC~~ HOSPITAL LAB): SARS Coronavirus 2: NEGATIVE

## 2020-05-28 MED ORDER — ARFORMOTEROL TARTRATE 15 MCG/2ML IN NEBU: 15.0000 ug | INHALATION_SOLUTION | Freq: Two times a day (BID) | RESPIRATORY_TRACT | Status: DC

## 2020-05-28 MED ORDER — ACETAMINOPHEN 10 MG/ML IV SOLN
1000.0000 mg | Freq: Once | INTRAVENOUS | Status: AC
  Administered 2020-05-28: 1000 mg via INTRAVENOUS
  Filled 2020-05-28: qty 100

## 2020-05-28 MED ORDER — SODIUM CHLORIDE 0.9 % IV SOLN: 250.0000 mL | INTRAVENOUS | Status: DC | PRN

## 2020-05-28 MED ORDER — SODIUM CHLORIDE 0.9% FLUSH
3.0000 mL | Freq: Two times a day (BID) | INTRAVENOUS | Status: DC
  Administered 2020-05-28 – 2020-06-01 (×8): 3 mL via INTRAVENOUS

## 2020-05-28 MED ORDER — IOHEXOL 350 MG/ML SOLN
75.0000 mL | Freq: Once | INTRAVENOUS | Status: AC | PRN
  Administered 2020-05-28: 75 mL via INTRAVENOUS

## 2020-05-28 MED ORDER — ALTEPLASE (STROKE) FULL DOSE INFUSION
0.9000 mg/kg | Freq: Once | INTRAVENOUS | Status: AC
  Filled 2020-05-28: qty 100

## 2020-05-28 MED ORDER — TIOTROPIUM BROMIDE MONOHYDRATE 18 MCG IN CAPS
18.0000 ug | ORAL_CAPSULE | Freq: Every day | RESPIRATORY_TRACT | Status: DC
  Administered 2020-05-29 – 2020-05-31 (×3): 18 ug via RESPIRATORY_TRACT
  Filled 2020-05-28: qty 5

## 2020-05-28 MED ORDER — SODIUM CHLORIDE 0.9% FLUSH: 3.0000 mL | INTRAVENOUS | Status: DC | PRN

## 2020-05-28 MED ORDER — SODIUM CHLORIDE 0.9 % IV SOLN
50.0000 mL | Freq: Once | INTRAVENOUS | Status: AC
  Administered 2020-05-28: 50 mL via INTRAVENOUS

## 2020-05-28 MED ORDER — LABETALOL HCL 5 MG/ML IV SOLN
10.0000 mg | Freq: Once | INTRAVENOUS | Status: AC
  Filled 2020-05-28: qty 4

## 2020-05-28 MED ORDER — IPRATROPIUM-ALBUTEROL 0.5-2.5 (3) MG/3ML IN SOLN
3.0000 mL | Freq: Four times a day (QID) | RESPIRATORY_TRACT | Status: DC
  Administered 2020-05-28 – 2020-05-29 (×3): 3 mL via RESPIRATORY_TRACT
  Filled 2020-05-28 (×3): qty 3

## 2020-05-28 MED ORDER — HYDROCODONE-ACETAMINOPHEN 5-325 MG PO TABS
1.0000 | ORAL_TABLET | Freq: Four times a day (QID) | ORAL | Status: DC | PRN
  Administered 2020-05-28 – 2020-05-29 (×3): 1 via ORAL
  Filled 2020-05-28 (×3): qty 1

## 2020-05-28 MED ORDER — ACETAMINOPHEN 325 MG PO TABS
650.0000 mg | ORAL_TABLET | Freq: Four times a day (QID) | ORAL | Status: DC | PRN
  Administered 2020-05-29 – 2020-05-31 (×2): 650 mg via ORAL
  Filled 2020-05-28 (×2): qty 2

## 2020-05-28 MED ORDER — DULOXETINE HCL 30 MG PO CPEP
60.0000 mg | ORAL_CAPSULE | Freq: Every day | ORAL | Status: DC
  Administered 2020-05-29 – 2020-06-01 (×4): 60 mg via ORAL
  Filled 2020-05-28 (×4): qty 2

## 2020-05-28 MED ORDER — PANTOPRAZOLE SODIUM 40 MG PO TBEC
40.0000 mg | DELAYED_RELEASE_TABLET | Freq: Every day | ORAL | Status: DC
  Administered 2020-05-29 – 2020-06-01 (×4): 40 mg via ORAL
  Filled 2020-05-28 (×4): qty 1

## 2020-05-28 MED ORDER — ALTEPLASE 100 MG IV SOLR
INTRAVENOUS | Status: AC
  Administered 2020-05-28: 63.6 mg via INTRAVENOUS
  Filled 2020-05-28: qty 100

## 2020-05-28 MED ORDER — SODIUM CHLORIDE 0.9% FLUSH: 3.0000 mL | Freq: Once | INTRAVENOUS | Status: DC

## 2020-05-28 MED ORDER — CHLORHEXIDINE GLUCONATE CLOTH 2 % EX PADS
6.0000 | MEDICATED_PAD | Freq: Every day | CUTANEOUS | Status: DC
  Administered 2020-05-28 – 2020-06-01 (×5): 6 via TOPICAL

## 2020-05-28 MED ORDER — LABETALOL HCL 5 MG/ML IV SOLN
INTRAVENOUS | Status: AC
  Administered 2020-05-28: 10 mg via INTRAVENOUS
  Filled 2020-05-28: qty 4

## 2020-05-28 NOTE — ED Notes (Signed)
Pt denies blood thinner use, denies brain bleed or recent surgery, states she has not smoked in ten years and does not drink.

## 2020-05-28 NOTE — Progress Notes (Signed)
   05/28/20 1609  Clinical Encounter Type  Visited With Patient and family together  Visit Type Initial  Referral From Nurse  Consult/Referral To Chaplain  Spiritual Encounters  Spiritual Needs Other (Comment)  CH silently prayed for pt in hallway of ED room. Will follow up later

## 2020-05-28 NOTE — ED Triage Notes (Addendum)
First nurse note-  Pt was watching tv and lost vision in right eye.  C/o vision being very blurry and cannot see when covers left eye.  Right side of tongue feels funny and felt off balance when tried to walk.   Sx started 2 PM.  Pt mildly dysarthric when speaking full sentences.  Off balance getting up to CT table.  1610- ok for CT by dr Scotty Court  1611- arrived in CT

## 2020-05-28 NOTE — ED Provider Notes (Signed)
[facility] Emergency Department Provider Note  ____________________________________________   First MD Initiated Contact with Patient 05/28/20 1619     (approximate)  I have reviewed the triage vital signs and the nursing notes.   HISTORY  Chief Complaint Code Stroke    HPI Kerri Steele is a 59 y.o. female  With PMHx HTN, HLD, COPD, DM, here with stroke ike symptoms. Last known normal was 2 PM. Pt states that she was sitting with her husband inusual state of health this afternoon. At around 2 PM, she noticed decreased vision, numbness of her R tongue with facial droop, and R sided arm and leg weakness. Since then, sx have persisted. She has a hard time moving her RUE and RLE. No h/o similar episodes. She has a mild HA which is new for her. No recent trauma. No blood thinner use. No other complaints.  Level 5 caveat invoked as remainder of history, ROS, and physical exam limited due to patient's acuity.         Past Medical History:  Diagnosis Date   Anginal pain (HCC)    Anxiety    Asthma    COPD (chronic obstructive pulmonary disease) (HCC)    Depression    Diabetes mellitus without complication (HCC)    GERD (gastroesophageal reflux disease)    Hypertension    Hypothyroidism    Pneumonia    Shortness of breath dyspnea    Sleep apnea     Patient Active Problem List   Diagnosis Date Noted   Acute colitis 07/30/2016   Breath shortness 10/18/2012    Past Surgical History:  Procedure Laterality Date   ANKLE SURGERY     BLADDER SURGERY     CARDIAC CATHETERIZATION Left 08/23/2015   Procedure: Left Heart Cath and Coronary Angiography;  Surgeon: Dalia Heading, MD;  Location: [facility] INVASIVE CV LAB;  Service: Cardiovascular;  Laterality: Left;   ESOPHAGEAL DILATION     TUBAL LIGATION     WRIST SURGERY      Prior to Admission medications   Medication Sig Start Date End Date Taking? Authorizing Provider   albuterol-ipratropium (COMBIVENT) 18-103 MCG/ACT inhaler Inhale 2 puffs into the lungs every 4 (four) hours as needed for wheezing or shortness of breath.    [provider]  arformoterol (BROVANA) 15 MCG/2ML NEBU Take 15 mcg by nebulization 2 (two) times daily.    [provider]  atorvastatin (LIPITOR) 20 MG tablet Take 20 mg by mouth daily.    [provider]  ciprofloxacin (CIPRO) 500 MG tablet Take 1 tablet (500 mg total) by mouth 2 (two) times daily. 08/22/17   Sharyn Creamer, MD  dicyclomine (BENTYL) 20 MG tablet Take 1 tablet (20 mg total) by mouth 4 (four) times daily -  before meals and at bedtime for 5 days. 09/06/19 09/11/19  Shaune Pollack, MD  DULoxetine (CYMBALTA) 60 MG capsule Take 60 mg by mouth daily.    [provider]  gabapentin (NEURONTIN) 800 MG tablet Take 800 mg by mouth 3 (three) times daily.    [provider]  glipiZIDE (GLUCOTROL XL) 10 MG 24 hr tablet Take 10 mg by mouth daily with breakfast.    [provider]  HYDROcodone-acetaminophen (NORCO) 5-325 MG tablet Take 1 tablet by mouth every 6 (six) hours as needed for severe pain. 08/01/17   Merrily Brittle, MD  HYDROcodone-acetaminophen (NORCO/VICODIN) 5-325 MG tablet Take 1 tablet by mouth every 4 (four) hours as needed for moderate pain. Patient  taking differently: Take 1 tablet by mouth at bedtime.  10/24/15   Cuthriell, Delorise Royals, PA-C  liraglutide (VICTOZA) 18 MG/3ML SOPN Inject 1.2 mg into the skin at bedtime. 01/01/17   [provider]  magnesium oxide (MAG-OX) 400 MG tablet Take 400 mg by mouth 2 (two) times daily.     [provider]  methocarbamol (ROBAXIN) 750 MG tablet Take 750 mg by mouth 3 (three) times daily.     [provider]  metroNIDAZOLE (FLAGYL) 500 MG tablet Take 1 tablet (500 mg total) by mouth 3 (three) times daily. 08/22/17   Sharyn Creamer, MD  metroNIDAZOLE (FLAGYL) 500 MG tablet Take 1 tablet (500 mg total) by mouth 3  (three) times daily. 08/23/17   Irean Hong, MD  omeprazole (PRILOSEC) 20 MG capsule Take 20 mg by mouth daily.    [provider]  ondansetron (ZOFRAN ODT) 4 MG disintegrating tablet Take 1 tablet (4 mg total) by mouth every 8 (eight) hours as needed for nausea or vomiting. 09/06/19   Shaune Pollack, MD  oxyCODONE-acetaminophen (ROXICET) 5-325 MG tablet Take 1 tablet by mouth every 4 (four) hours as needed for severe pain. 08/23/17   Irean Hong, MD  pantoprazole (PROTONIX) 20 MG tablet Take 1 tablet (20 mg total) by mouth daily. 02/16/17 02/16/18  Jene Every, MD  potassium chloride (K-DUR) 10 MEQ tablet Take 1 tablet (10 mEq total) by mouth daily. 08/22/17   Sharyn Creamer, MD  promethazine (PHENERGAN) 12.5 MG tablet Take 1 tablet (12.5 mg total) by mouth every 6 (six) hours as needed for nausea or vomiting. 02/16/17   Jene Every, MD  sucralfate (CARAFATE) 1 g tablet Take 1 tablet (1 g total) by mouth 4 (four) times daily. 02/16/17 02/16/18  Jene Every, MD  tiotropium (SPIRIVA) 18 MCG inhalation capsule Place 18 mcg into inhaler and inhale daily.    [provider]  traMADol (ULTRAM) 50 MG tablet Take 50 mg by mouth every 6 (six) hours as needed for moderate pain.    [provider]  verapamil (CALAN-SR) 120 MG CR tablet Take 120 mg by mouth daily. 02/06/17   [provider]    Allergies Metformin and related, Morphine and related, and Sulfa antibiotics  Family History  Problem Relation Age of Onset   Heart disease Father    Hypertension Mother    Diabetes Mother    Lupus Mother    Atrial fibrillation Mother     Social History Social History   Tobacco Use   Smoking status: Former Smoker   Smokeless tobacco: Never Used  Substance Use Topics   Alcohol use: No   Drug use: No    Review of Systems  Review of Systems  Constitutional: Negative for fatigue and fever.  HENT: Negative for congestion and sore throat.   Eyes: Negative for  visual disturbance.  Respiratory: Negative for cough and shortness of breath.   Cardiovascular: Negative for chest pain.  Gastrointestinal: Negative for abdominal pain, diarrhea, nausea and vomiting.  Genitourinary: Negative for flank pain.  Musculoskeletal: Negative for back pain and neck pain.  Skin: Negative for rash and wound.  Neurological: Positive for speech difficulty, weakness, numbness and headaches.  All other systems reviewed and are negative.    ____________________________________________  PHYSICAL EXAM:      VITAL SIGNS: ED Triage Vitals  Enc Vitals Group     BP 05/28/20 1621 (!) 155/87     Pulse Rate 05/28/20 1621 71  Resp 05/28/20 1621 20     Temp 05/28/20 1621 98.7 F (37.1 C)     Temp src --      SpO2 05/28/20 1621 98 %     Weight 05/28/20 1623 155 lb 13.8 oz (70.7 kg)     Height 05/28/20 1623 5' (1.524 m)     Head Circumference --      Peak Flow --      Pain Score 05/28/20 1623 0     Pain Loc --      Pain Edu? --      Excl. in GC? --      Physical Exam Vitals and nursing note reviewed.  Constitutional:      General: She is not in acute distress.    Appearance: She is well-developed.  HENT:     Head: Normocephalic and atraumatic.  Eyes:     Conjunctiva/sclera: Conjunctivae normal.  Cardiovascular:     Rate and Rhythm: Normal rate and regular rhythm.     Heart sounds: Normal heart sounds. No murmur heard.  No friction rub.  Pulmonary:     Effort: Pulmonary effort is normal. No respiratory distress.     Breath sounds: Normal breath sounds. No wheezing or rales.  Abdominal:     General: There is no distension.     Palpations: Abdomen is soft.     Tenderness: There is no abdominal tenderness.  Musculoskeletal:     Cervical back: Neck supple.  Skin:    General: Skin is warm.     Capillary Refill: Capillary refill takes less than 2 seconds.  Neurological:     Mental Status: She is alert and oriented to person, place, and time.     Motor:  No abnormal muscle tone.       ____________________________________________   LABS (all labs ordered are listed, but only abnormal results are displayed)  Labs Reviewed  CBC - Abnormal; Notable for the following components:      Result Value   WBC 11.1 (*)    RBC 5.20 (*)    MCV 79.0 (*)    MCH 24.4 (*)    RDW 15.9 (*)    All other components within normal limits  COMPREHENSIVE METABOLIC PANEL - Abnormal; Notable for the following components:   Potassium 3.4 (*)    Glucose, Bld 108 (*)    Creatinine, Ser 1.01 (*)    All other components within normal limits  SARS CORONAVIRUS 2 BY RT PCR (HOSPITAL ORDER, PERFORMED IN [facility])  PROTIME-INR  APTT  DIFFERENTIAL  GLUCOSE, CAPILLARY  CBG MONITORING, ED  POC URINE PREG, ED    ____________________________________________  EKG: Normal sinus rhythm, ventricular rate 74.  PR 124, QRS 84, QTc 47.  No acute ST elevations or depressions. ________________________________________  RADIOLOGY All imaging, including plain films, CT scans, and ultrasounds, independently reviewed by me, and interpretations confirmed via formal radiology reads.  ED MD interpretation:   CT head: No acute abnormality  Official radiology report(s): CT HEAD CODE STROKE WO CONTRAST  Result Date: 05/28/2020 CLINICAL DATA:  Code stroke.  Right vision loss EXAM: CT HEAD WITHOUT CONTRAST TECHNIQUE: Contiguous axial images were obtained from the base of the skull through the vertex without intravenous contrast. COMPARISON:  None. FINDINGS: Brain: No acute intracranial hemorrhage, mass effect, or edema. Gray-white differentiation is preserved. There is no extra-axial fluid collection. Ventricles and sulci are normal in size and configuration. Vascular: No hyperdense vessel. Minor intracranial atherosclerotic calcification at the  skull base. Skull: Unremarkable. Sinuses/Orbits: Visualized orbits are unremarkable. Trace paranasal sinus mucosal  thickening. Other: Mastoid air cells are clear. ASPECTS (Alberta Stroke Program Early CT Score) - Ganglionic level infarction (caudate, lentiform nuclei, internal capsule, insula, M1-M3 cortex): 7 - Supraganglionic infarction (M4-M6 cortex): 3 Total score (0-10 with 10 being normal): 10 IMPRESSION: No acute intracranial hemorrhage or evidence of acute infarction. ASPECT score is 10. These results were called by telephone at the time of interpretation on 05/28/2020 at 4:24 pm to provider Shaune Pollack , who verbally acknowledged these results. Electronically Signed   By: Guadlupe Spanish M.D.   On: 05/28/2020 16:24    ____________________________________________  PROCEDURES   Procedure(s) performed (including Critical Care):  .1-3 Lead EKG Interpretation Performed by: Shaune Pollack, MD Authorized by: Shaune Pollack, MD     Interpretation: normal     ECG rate:  70-90   ECG rate assessment: normal     Rhythm: sinus rhythm     Ectopy: none     Conduction: normal   Comments:     Indication: Stroke .Critical Care Performed by: Shaune Pollack, MD Authorized by: Shaune Pollack, MD   Critical care provider statement:    Critical care time (minutes):  45   Critical care time was exclusive of:  Separately billable procedures and treating other patients and teaching time   Critical care was necessary to treat or prevent imminent or life-threatening deterioration of the following conditions:  Cardiac failure, circulatory failure, respiratory failure and CNS failure or compromise   Critical care was time spent personally by me on the following activities:  Development of treatment plan with patient or surrogate, discussions with consultants, evaluation of patient's response to treatment, examination of patient, obtaining history from patient or surrogate, ordering and performing treatments and interventions, ordering and review of laboratory studies, ordering and review of radiographic studies,  pulse oximetry, re-evaluation of patient's condition and review of old charts   I assumed direction of critical care for this patient from another provider in my specialty: no      ____________________________________________  INITIAL IMPRESSION / MDM / ASSESSMENT AND PLAN / ED COURSE  As part of my medical decision making, I reviewed the following data within the electronic MEDICAL RECORD NUMBER Nursing notes reviewed and incorporated, Old chart reviewed, Notes from prior ED visits, and [place] Controlled Substance Database       *Kerri Steele was evaluated in Emergency Department on 05/28/2020 for the symptoms described in the history of present illness. She was evaluated in the context of the global COVID-19 pandemic, which necessitated consideration that the patient might be at risk for infection with the SARS-CoV-2 virus that causes COVID-19. Institutional protocols and algorithms that pertain to the evaluation of patients at risk for COVID-19 are in a state of rapid change based on information released by regulatory bodies including the CDC and federal and state organizations. These policies and algorithms were followed during the patient's care in the ED.  Some ED evaluations and interventions may be delayed as a result of limited staffing during the pandemic.*  Clinical Course as of May 28 1722  Sun May 28, 2020  1631 60 yo F here with stroke-like symptoms. Activated as CODE STROKE on arrival. Tele neuro in with pt now. CT Head reviewed, NAICA. BP is controlled. Labs pending at this time.   [CI]  1641 Plt count normal. No recent surgeries, h/o bleeding, or other contraindications for tPA at this time. Persistent Rsided deficits noted.    [  CI]  1721 After informed consent discussion with neurology and patient, decision made to give TPA.  Feel this is reasonable given her significant right-sided weakness, numbness, and no new onset.  No apparent contraindications.  Risks and benefits discussed.  Will  admit to the ICU.  CT angio pending.   [CI]    Clinical Course User Index [CI] Shaune Pollack, MD    Medical Decision Making:  As above.  ____________________________________________  FINAL CLINICAL IMPRESSION(S) / ED DIAGNOSES  Final diagnoses:  Acute ischemic stroke (HCC)     MEDICATIONS GIVEN DURING THIS VISIT:  Medications  sodium chloride flush (NS) 0.9 % injection 3 mL (has no administration in time range)  labetalol (NORMODYNE) 5 MG/ML injection (has no administration in time range)  alteplase (ACTIVASE) 1 mg/mL injection (has no administration in time range)  alteplase (ACTIVASE) 1 mg/mL infusion 63.6 mg (has no administration in time range)    Followed by  0.9 %  sodium chloride infusion (has no administration in time range)  labetalol (NORMODYNE) injection 10 mg (has no administration in time range)  acetaminophen (OFIRMEV) IV 1,000 mg (has no administration in time range)  iohexol (OMNIPAQUE) 350 MG/ML injection 75 mL (75 mLs Intravenous Contrast Given 05/28/20 1719)     ED Discharge Orders    None       Note:  This document was prepared using Dragon voice recognition software and may include unintentional dictation errors.   Shaune Pollack, MD 05/28/20 1723

## 2020-05-28 NOTE — ED Notes (Addendum)
Alteplase bolus of 6.4 mg given over 1 minute IV at this time.

## 2020-05-28 NOTE — ED Notes (Addendum)
Alteplase infusion started at this time (see emar)

## 2020-05-28 NOTE — Consult Note (Addendum)
[facility] Tele[facility]urology Consult Services   Date of Service:   05/28/2020 16:25:28  Impression:     .  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 She appears to have right-sided weakness including right arm and leg with drift. We talked in detail about possibility of stroke as cause of this. We talked about IV alteplase. I explained to her in detail the risks of bleeding and other complications with alteplase. We talked about 6% risk of bleeding and 1 to 3% risk of fatality. We talked about allergic reaction. We talked about pros and cons and after discussing this in detail, patient and the husband both wanted to proceed with IV alteplase. This was given without any problems. Before IV alteplase was given, she was complaining of a headache and I gave her an order for IV Tylenol to see if this would help. She will also go for CT angiograms after alteplase. I will follow-up on the results. All this was discussed in detail with the family and all their questions and concerns were addressed. She will be admitted to the hospital for further work-up with MRI of the head, echocardiogram and telemetry monitoring along with PT, OT and speech eval's and neurology follow-up.  Metrics: Last Known Well: 05/28/2020 13:00:00 [facility] Notification Time: 05/28/2020 16:25:28 Arrival Time: 05/28/2020 16:02:30 Stamp Time: 05/28/2020 16:25:28 Time First Login Attempt: 05/28/2020 16:26:29 Symptoms: right sided weakness NIHSS Start Assessment Time: 05/28/2020 16:32:21 Thrombolytic Early Mix Decision Time: 05/28/2020 16:34:01 Patient is a candidate for Thrombolytic. Thrombolytic Medical Decision: 05/28/2020 16:34:02 Thrombolytic CPOE Order Time: 05/28/2020 16:38:00 Needle Time: 05/28/2020 17:02:40 Weight Noted by Staff: 70.7 kg Reason for Thrombolytic Delay: Delayed hospital  activation of TS Service  CT head showed no acute hemorrhage or acute core infarct.  ED Physician notified of diagnostic impression and management plan on 05/28/2020 16:38:34  Advanced Imaging: CTA Head and Neck Completed.  LVO:No   Thrombolytic Contraindications:  Last Known Well > 4.5 hours: No CT Head showing hemorrhage: No Ischemic stroke within 3 months: No Severe head trauma within 3 months: No Intracranial/intraspinal surgery within 3 months: No History of intracranial hemorrhage: No Symptoms and signs consistent with an SAH: No GI malignancy or GI bleed within 21 days: No Coagulopathy: Platelets <100 000/mm3, INR >1.7, aPTT>40 s, or PT >15 s: No Treatment dose of LMWH within the previous 24 hrs: No Use of NOACs in past 48 hours: No Glycoprotein IIb/IIIa receptor inhibitors use: No Symptoms consistent with infective endocarditis: No Suspected aortic arch dissection: No Intra-axial intracranial neoplasm: No  Verbal Consent to Thrombolytic: I have explained to the Patient the nature of the patient's condition, reviewed the indications and contraindications to the use of Thrombolytic fibrinolytic agent, reviewed the indications and contraindications and the benefits to be reasonably expected compared with alternative approaches. I have discussed the likelihood of major risks or complications of this procedure including (if applicable) but not limited to loss of limb function, brain damage, paralysis, hemorrhage, infection, complications from transfusion of blood components, drug reactions, blood clots and loss of life. I have also indicated that with any procedure there is always the possibility of an unexpected complication. All questions were answered and Patient express understanding of the treatment plan and consent to the treatment.  Our recommendations are outlined below.  Recommendations: IV Alteplase recommended.  Thrombolytic bolus given Without  Complication.   IV Alteplase/Activase Total Dose - 63.6 mg IV  Alteplase/Activase Bolus Dose - 6.4 mg IV Alteplase/Activase Infusion Dose - 57.3 mg  Routine post Thrombolytic monitoring including neuro checks and blood pressure control during/after treatment Monitor blood pressure Check blood pressure and neuro assessment every 15 min for 2 h, then every 30 min for 6 h, and finally every hour for 16 h.  Manage Blood Pressure per post Thrombolytic protocol.      .  Admission to ICU     .  CT brain 24 hours post Thrombolytic     .  NPO until swallowing screen performed and passed     .  No antiplatelet agents or anticoagulants (including heparin for DVT prophylaxis) in first 24 hours     .  No Foley catheter, nasogastric tube, arterial catheter or central venous catheter for 24 hr, unless absolutely necessary     .  Telemetry     .  Bedside swallow evaluation     .  HOB less than 30 degrees     .  Euglycemia     .  Avoid hyperthermia, PRN acetaminophen     .  DVT prophylaxis     .  Inpatient Neurology Consultation     .  Stroke evaluation as per inpatient neurology recommendations  Discussed with ED physician    ------------------------------------------------------------------------------  History of Present Illness: Patient is a 59 year old Female.  Patient was brought by private transportation with symptoms of right sided weakness  Extremely pleasant 59 year old female with past medical history of hypertension, diabetes, hyperlipidemia came to the hospital because of headache and right-sided weakness. Symptoms started around 1:00. She started having some blurry vision. Also was having a left-sided headache. She later on developed some numbness of the tongue of the face on the right side and then started having weakness on the right side. Patient is complaining of right-sided weakness, difficulty walking and balance issues. She denies any issues with her swallowing but does  complain of some speech slurring. Denies any seizures. Denies any chest pain or palpitation. Plain CT was negative. She does report history of headaches.   Past Medical History:     . Hypertension     . Diabetes Mellitus     . Hyperlipidemia  Social History: Smoking: Former Alcohol Use: No Drug Use: No  Review of System:  14 Points Review of Systems was performed and was negative except mentioned in HPI.  Anticoagulant use:  No  Antiplatelet use: No   Examination: BP(149/92), Pulse(77), Blood Glucose(97) 1A: Level of Consciousness - Alert; keenly responsive + 0 1B: Ask Month and Age - Both Questions Right + 0 1C: Blink Eyes & Squeeze Hands - Performs Both Tasks + 0 2: Test Horizontal Extraocular Movements - Normal + 0 3: Test Visual Fields - No Visual Loss + 0 4: Test Facial Palsy (Use Grimace if Obtunded) - Normal symmetry + 0 5A: Test Left Arm Motor Drift - No Drift for 10 Seconds + 0 5B: Test Right Arm Motor Drift - Drift, hits bed + 2 6A: Test Left Leg Motor Drift - No Drift for 5 Seconds + 0 6B: Test Right Leg Motor Drift - Drift, but doesn't hit bed + 1 7: Test Limb Ataxia (FNF/Heel-Shin) - No Ataxia + 0 8: Test Sensation - Mild-Moderate Loss: Less Sharp/More Dull + 1 9: Test Language/Aphasia - Normal; No aphasia + 0 10: Test Dysarthria - Mild-Moderate Dysarthria: Slurring but can be understood + 1 11: Test Extinction/Inattention - No abnormality + 0  NIHSS Score: 5  Pre-Morbid Modified Rankin Scale: 0 Points = No symptoms at all   Patient/Family was informed the Neurology Consult would occur via TeleHealth consult by way of interactive audio and video telecommunications and consented to receiving care in this manner.   Patient is being evaluated for possible acute neurologic impairment and high probability of imminent or life-threatening deterioration. I spent total of 45 minutes providing care to this patient, including time for face to face visit via  telemedicine, review of medical records, imaging studies and discussion of findings with providers, the patient and/or family.   Dr Hedy Camara   [facility] [phone]  Case [id number]  Addendum: CTA head and neck negative for LVO.

## 2020-05-28 NOTE — H&P (Signed)
Name: Kerri Steele MRN: [id number] DOB: 1960/12/06     CONSULTATION DATE: 05/28/2020  REFERRING MD :  Erma Heritage  CHIEF COMPLAINT:  Acute RT sided weakness    HISTORY OF PRESENT ILLNESS:  59 y.o. female  With PMHx HTN, HLD, COPD, DM Seen in ER for here with stroke ike symptoms.  Last known normal was 2 PM.  Pt states that she was sitting with her husband inusual state of health this afternoon.  At around 2 PM, she noticed decreased vision, numbness of her R tongue with facial droop, and R sided arm and leg weakness.   Since then, sx have persisted. She has a hard time moving her RUE and RLE. No h/o similar episodes. She has a mild HA which is new for her. No recent trauma. No blood thinner use. No other complaints.  TELE NEUROLOGIST ASSESSMENT "appears to have right-sided weakness including right arm and leg with drift. We talked in detail about possibility of stroke as cause of this. We talked about IV alteplase. I explained to her in detail the risks of bleeding and other complications with alteplase. We talked about 6% risk of bleeding and 1 to 3% risk of fatality. We talked about allergic reaction. We talked about pros and cons and after discussing this in detail, patient and the husband both wanted to proceed with IV alteplase. This was given without any problems. Before IV alteplase was given, she was complaining of a headache and I gave her an order for IV Tylenol to see if this would help. She will also go for CT angiograms after alteplase. I will follow-up on the results. All this was discussed in detail with the family and all their questions and concerns were addressed. She will be admitted to the hospital for further work-up with MRI of the head, echocardiogram and telemetry monitoring along with PT, OT and speech eval's and neurology follow-up"    PAST MEDICAL HISTORY :   has a past medical history of Anginal pain (HCC), Anxiety, Asthma, COPD (chronic obstructive pulmonary  disease) (HCC), Depression, Diabetes mellitus without complication (HCC), GERD (gastroesophageal reflux disease), Hypertension, Hypothyroidism, Pneumonia, Shortness of breath dyspnea, and Sleep apnea.  has a past surgical history that includes Esophageal dilation; Tubal ligation; Bladder surgery; Ankle surgery; Wrist surgery; and Cardiac catheterization (Left, 08/23/2015). Prior to Admission medications   Medication Sig Start Date End Date Taking? Authorizing Provider  arformoterol (BROVANA) 15 MCG/2ML NEBU Take 15 mcg by nebulization 2 (two) times daily.    [provider]  atorvastatin (LIPITOR) 20 MG tablet Take 20 mg by mouth daily.    [provider]  DULoxetine (CYMBALTA) 60 MG capsule Take 60 mg by mouth daily.    [provider]  gabapentin (NEURONTIN) 800 MG tablet Take 800 mg by mouth 3 (three) times daily.    [provider]  glipiZIDE (GLUCOTROL XL) 10 MG 24 hr tablet Take 10 mg by mouth daily with breakfast.    [provider]  HYDROcodone-acetaminophen (NORCO) 5-325 MG tablet Take 1 tablet by mouth every 6 (six) hours as needed for severe pain. 08/01/17   Merrily Brittle, MD  magnesium oxide (MAG-OX) 400 MG tablet Take 400 mg by mouth 2 (two) times daily.     [provider]  methocarbamol (ROBAXIN) 750 MG tablet Take 750 mg by mouth 3 (three) times daily.     [provider]  omeprazole (PRILOSEC) 20 MG capsule Take 20 mg by mouth daily.    [provider]  pregabalin (LYRICA) 150 MG capsule Take 150 mg by mouth 2 (two) times daily. 05/22/20   [provider]  promethazine (PHENERGAN) 12.5 MG tablet Take 1 tablet (12.5 mg total) by mouth every 6 (six) hours as needed for nausea or vomiting. 02/16/17   Jene Every, MD  sucralfate (CARAFATE) 1 g tablet Take 1 tablet (1 g total) by mouth 4 (four) times daily. 02/16/17 02/16/18  Jene Every, MD  tiotropium (SPIRIVA) 18 MCG inhalation capsule Place 18 mcg into  inhaler and inhale daily.    [provider]  traMADol (ULTRAM) 50 MG tablet Take 50 mg by mouth every 6 (six) hours as needed for moderate pain.    [provider]  verapamil (CALAN-SR) 120 MG CR tablet Take 120 mg by mouth daily. 02/06/17   [provider]   Allergies  Allergen Reactions  . Metformin And Related Other (See Comments)    migraine  . Morphine And Related Hives  . Sulfa Antibiotics Other (See Comments)    FAMILY HISTORY:  family history includes Atrial fibrillation in her mother; Diabetes in her mother; Heart disease in her father; Hypertension in her mother; Lupus in her mother. SOCIAL HISTORY:  reports that she has quit smoking. She has never used smokeless tobacco. She reports that she does not drink alcohol and does not use drugs.   Review of Systems:  Gen:  Denies  fever, sweats, chills weight loss  HEENT: headache, blurred vision, double vision, ear pain, eye pain, hearing loss, nose bleeds, sore throat Cardiac:  No dizziness, chest pain or heaviness, chest tightness,edema, No JVD Resp:   No cough, -sputum production, -shortness of breath,-wheezing, -hemoptysis,  Gi: Denies swallowing difficulty, stomach pain, nausea or vomiting, diarrhea, constipation, bowel incontinence Gu:  Denies bladder incontinence, burning urine Ext:   RLE and RUE weakness, Joint pain, stiffness or swelling Skin: Denies  skin rash, easy bruising or bleeding or hives Endoc:  Denies polyuria, polydipsia , polyphagia or weight change Psych:   Denies depression, insomnia or hallucinations  Other:  All other systems negative   Estimated body mass index is 30.44 kg/m as calculated from the following:   Height as of this encounter: 5' (1.524 m).   Weight as of this encounter: 70.7 kg.    VITAL SIGNS: Temp:  [98.7 F (37.1 C)] 98.7 F (37.1 C) (07/18 1621) Pulse Rate:  [71-85] 82 (07/18 1746) Resp:  [17-24] 20 (07/18 1746) BP: (145-160)/(80-131) 148/104  (07/18 1746) SpO2:  [94 %-100 %] 97 % (07/18 1746) Weight:  [70.7 kg] 70.7 kg (07/18 1623)   No intake/output data recorded. No intake/output data recorded.   SpO2: 97 %   Physical Examination:  GENERAL:critically ill appearing, +resp distress HEAD: Normocephalic, atraumatic.  EYES: Pupils equal, round, reactive to light.  No scleral icterus.  MOUTH: Moist mucosal membrane. NECK: Supple. No JVD.  PULMONARY: diminished throughout, even, non labored  CARDIOVASCULAR: S1 and S2. Regular rate and rhythm. No murmurs, rubs, or gallops.  GASTROINTESTINAL: Soft, nontender, -distended.  Positive bowel sounds.  MUSCULOSKELETAL: RLE and RUE weakness  NEUROLOGIC: alert and oriented, follows commands, PERRLA  SKIN:intact,warm,dry   MEDICATIONS: I have reviewed all medications and confirmed regimen as documented   CULTURE RESULTS   No results found for this or any previous visit (from the past 240 hour(s)).        IMAGING    CT HEAD CODE STROKE WO CONTRAST  Result Date: 05/28/2020 CLINICAL DATA:  Code stroke.  Right  vision loss EXAM: CT HEAD WITHOUT CONTRAST TECHNIQUE: Contiguous axial images were obtained from the base of the skull through the vertex without intravenous contrast. COMPARISON:  None. FINDINGS: Brain: No acute intracranial hemorrhage, mass effect, or edema. Gray-white differentiation is preserved. There is no extra-axial fluid collection. Ventricles and sulci are normal in size and configuration. Vascular: No hyperdense vessel. Minor intracranial atherosclerotic calcification at the skull base. Skull: Unremarkable. Sinuses/Orbits: Visualized orbits are unremarkable. Trace paranasal sinus mucosal thickening. Other: Mastoid air cells are clear. ASPECTS (Alberta Stroke Program Early CT Score) - Ganglionic level infarction (caudate, lentiform nuclei, internal capsule, insula, M1-M3 cortex): 7 - Supraganglionic infarction (M4-M6 cortex): 3 Total score (0-10 with 10 being normal):  10 IMPRESSION: No acute intracranial hemorrhage or evidence of acute infarction. ASPECT score is 10. These results were called by telephone at the time of interpretation on 05/28/2020 at 4:24 pm to provider Shaune Pollack , who verbally acknowledged these results. Electronically Signed   By: Guadlupe Spanish M.D.   On: 05/28/2020 16:24     Nutrition Status:           Indwelling Urinary Catheter continued, requirement due to   Reason to continue Indwelling Urinary Catheter strict Intake/Output monitoring for hemodynamic instability      ASSESSMENT AND PLAN SYNOPSIS   59 yo WF with acute RT sided weakness and facial droop diagnosis of acute CVA s/p TPA  Admit to ICU -avoid secondary brain injury Follow up NEUROLOGY ASSESSMENT Follow post TPA order set Allow for permissive hypertension  NEURO WORK UP-MRI, PT OT, ECHO  TRANSFUSIONS AS NEEDED MONITOR FSBS I Assessed the need for Labs I Assessed the need for Foley I Assessed the need for Central Venous Line Family Discussion when availableI Assessed the need for Mobilization I made an Assessment of medications to be adjusted accordingly Safety Risk assessment completed    Sonda Rumble, AGNP  Pulmonary/Critical Care Pager [phone] (please enter 7 digits) PCCM Consult Pager [phone] (please enter 7 digits)

## 2020-05-28 NOTE — ED Notes (Signed)
Tele neurologist speaking with pt at this time

## 2020-05-29 ENCOUNTER — Inpatient Hospital Stay: Payer: Medicare Other

## 2020-05-29 DIAGNOSIS — I10 Essential (primary) hypertension: Secondary | ICD-10-CM

## 2020-05-29 LAB — BASIC METABOLIC PANEL
Anion gap: 6 (ref 5–15)
BUN: 14 mg/dL (ref 6–20)
CO2: 30 mmol/L (ref 22–32)
Calcium: 9 mg/dL (ref 8.9–10.3)
Chloride: 107 mmol/L (ref 98–111)
Creatinine, Ser: 0.99 mg/dL (ref 0.44–1.00)
GFR calc Af Amer: >60 mL/min (ref >60)
GFR calc non Af Amer: >60 mL/min (ref >60)
Glucose, Bld: 79 mg/dL (ref 70–99)
Potassium: 3.1 mmol/L — ABNORMAL LOW (ref 3.5–5.1)
Sodium: 143 mmol/L (ref 135–145)

## 2020-05-29 LAB — CBC
HCT: 38.1 % (ref 36.0–46.0)
Hemoglobin: 11.6 g/dL — ABNORMAL LOW (ref 12.0–15.0)
MCH: 24.1 pg — ABNORMAL LOW (ref 26.0–34.0)
MCHC: 30.4 g/dL (ref 30.0–36.0)
MCV: 79.2 fL — ABNORMAL LOW (ref 80.0–100.0)
Platelets: 299 10*3/uL (ref 150–400)
RBC: 4.81 MIL/uL (ref 3.87–5.11)
RDW: 15.9 % — ABNORMAL HIGH (ref 11.5–15.5)
WBC: 7.7 10*3/uL (ref 4.0–10.5)
nRBC: 0 % (ref 0.0–0.2)

## 2020-05-29 LAB — HIV ANTIBODY (ROUTINE TESTING W REFLEX): HIV Screen 4th Generation wRfx: NONREACTIVE

## 2020-05-29 LAB — PHOSPHORUS: Phosphorus: 3.9 mg/dL (ref 2.5–4.6)

## 2020-05-29 LAB — MAGNESIUM: Magnesium: 2.3 mg/dL (ref 1.7–2.4)

## 2020-05-29 MED ORDER — ATORVASTATIN CALCIUM 20 MG PO TABS
40.0000 mg | ORAL_TABLET | Freq: Every day | ORAL | Status: DC
  Administered 2020-05-29 – 2020-06-01 (×4): 40 mg via ORAL
  Filled 2020-05-29 (×4): qty 2

## 2020-05-29 MED ORDER — IPRATROPIUM-ALBUTEROL 0.5-2.5 (3) MG/3ML IN SOLN: 3.0000 mL | Freq: Four times a day (QID) | RESPIRATORY_TRACT | Status: DC | PRN

## 2020-05-29 MED ORDER — POTASSIUM CHLORIDE CRYS ER 20 MEQ PO TBCR
20.0000 meq | EXTENDED_RELEASE_TABLET | ORAL | Status: AC
  Administered 2020-05-29 (×2): 20 meq via ORAL
  Filled 2020-05-29 (×2): qty 1

## 2020-05-29 NOTE — Evaluation (Signed)
Clinical/Bedside Swallow Evaluation Patient Details  Name: Kerri Steele MRN: [id number] Date of Birth: 05-04-1961  Today's Date: 05/29/2020 Time: SLP Start Time (ACUTE ONLY): 1315 SLP Stop Time (ACUTE ONLY): 1350 SLP Time Calculation (min) (ACUTE ONLY): 35 min  Past Medical History:  Past Medical History:  Diagnosis Date  . Anginal pain (HCC)   . Anxiety   . Asthma   . COPD (chronic obstructive pulmonary disease) (HCC)   . Depression   . Diabetes mellitus without complication (HCC)   . GERD (gastroesophageal reflux disease)   . Hypertension   . Hypothyroidism   . Pneumonia   . Shortness of breath dyspnea   . Sleep apnea    Past Surgical History:  Past Surgical History:  Procedure Laterality Date  . ANKLE SURGERY    . BLADDER SURGERY    . CARDIAC CATHETERIZATION Left 08/23/2015   Procedure: Left Heart Cath and Coronary Angiography;  Surgeon: Dalia Heading, MD;  Location: [facility] INVASIVE CV LAB;  Service: Cardiovascular;  Laterality: Left;  . ESOPHAGEAL DILATION    . TUBAL LIGATION    . WRIST SURGERY     HPI:  Kerri Steele is a 59 y.o. female  With PMHx HTN, HLD, COPD, DM, here with stroke ike symptoms. Last known normal was 2 PM. Pt states that she was sitting with her husband inusual state of health this afternoon. At around 2 PM, she noticed decreased vision, numbness of her R tongue with facial droop, and R sided arm and leg weakness. Since then, sx have persisted. She has a hard time moving her RUE and RLE. No h/o similar episodes. She has a mild HA which is new for her. No recent trauma. No blood thinner use. No other complaints   Assessment / Plan / Recommendation Clinical Impression  Kerri Steele is a 59 YO female admitted with right sided stroke symptoms. Pt is currently in CCU receiving TPA. Bedside swallow eval revealed oral mech exam to be normal with the exception of slight tingling sensation on the right side of her face and tongue. Pt reports it has gotten better  since admission. Pt has natural teeth but several missing. She reports a history of esophageal dysphagia or in her words "a tiny esophagus" having dilatation at some point several years ago. Today, Pt tolerated sips of thin, bites of applesauce and graham cracker without s/s of aspiration. Pt was noted to take very small bites of the graham cracker but reported she does this at home trying to be careful and chew thoroughly. Vocal quality remained clear, laryngeal elevation appeared adequate. Rec Dysphagia 3 diet with chopped meats and thin liquids. Pt encouraged to f/u with her GI doctor if further episodes of esophageal dysphagia. Speech clear and appropriate during swallowing assessment although husband reports she has new difficulty of attending to tasks and often "spaces out" during conversation. Rec F/u with OP MD if this persists in the event that a full OP speech, language and cognitive eval is warranted. Prognosis great. SLP Visit Diagnosis: Dysphagia, oropharyngeal phase (R13.12)    Aspiration Risk  Mild aspiration risk    Diet Recommendation Dysphagia 3 (Mech soft);Other (Comment) (Chopped meats)   Medication Administration: Whole meds with liquid Supervision: Patient able to self feed Compensations: Slow rate;Small sips/bites;Follow solids with liquid Postural Changes: Seated upright at 90 degrees;Remain upright for at least 30 minutes after po intake    Other  Recommendations Recommended Consults: Other (Comment) (encouraged OP GI f/u if needed.)   Follow up  Recommendations  F/u with toleration of diet     Frequency and Duration min 1 x/week  1 week       Prognosis   Good     Swallow Study   General Date of Onset: 05/28/20 HPI: Kerri Steele is a 59 y.o. female  With PMHx HTN, HLD, COPD, DM, here with stroke ike symptoms. Last known normal was 2 PM. Pt states that she was sitting with her husband inusual state of health this afternoon. At around 2 PM, she noticed decreased  vision, numbness of her R tongue with facial droop, and R sided arm and leg weakness. Since then, sx have persisted. She has a hard time moving her RUE and RLE. No h/o similar episodes. She has a mild HA which is new for her. No recent trauma. No blood thinner use. No other complaints Type of Study: Bedside Swallow Evaluation Diet Prior to this Study: NPO Temperature Spikes Noted: No Respiratory Status: Room air History of Recent Intubation: No Behavior/Cognition: Alert;Cooperative;Pleasant mood Oral Cavity Assessment: Within Functional Limits Oral Care Completed by SLP: No Oral Cavity - Dentition: Missing dentition;Adequate natural dentition Vision: Functional for self-feeding Self-Feeding Abilities: Able to feed self Patient Positioning: Upright in bed Baseline Vocal Quality: Normal Volitional Swallow: Able to elicit    Oral/Motor/Sensory Function Overall Oral Motor/Sensory Function: Mild impairment Facial ROM: Reduced right;Other (Comment) (Mild decreased facial sensation) Facial Symmetry: Within Functional Limits Facial Strength: Within Functional Limits Facial Sensation: Reduced right Lingual ROM: Within Functional Limits Lingual Symmetry: Within Functional Limits Lingual Strength: Within Functional Limits Lingual Sensation: Within Functional Limits Velum: Within Functional Limits Mandible: Within Functional Limits   Ice Chips Ice chips: Not tested   Thin Liquid Thin Liquid: Within functional limits Presentation: Cup;Spoon;Straw    Nectar Thick Nectar Thick Liquid: Not tested   Honey Thick     Puree Puree: Within functional limits Presentation: Self Fed   Solid     Solid: Impaired (Pt takes very small bites and reports "small esophagus")      Eather Colas 05/29/2020,2:34 PM

## 2020-05-29 NOTE — Progress Notes (Signed)
CH visited pt. while rounding on ICU; pt. lying down in bed w/husband Dorene Sorrow in chair at bedside.  Pt. shared she suffered a stroke yesterday afternoon and her husband drove her from their home near the [place] line down to [facility]; Pt. appears to be recovering well and is grateful for this.  Husband has had hearing difficulty for over 30 yrs., he shared, and reads lips to compensate.  Husband appears to be major source of support; is making trips b/w [place] and home to tend to animals there.  Pt. requested prayer for healing and recovery.  Pt. and husband grateful for [facility] support.  No further needs at this time.    05/29/20 1200  Clinical Encounter Type  Visited With Patient and family together  Visit Type Initial;Psychological support;Spiritual support;Social support;Critical Care  Referral From Patient  Spiritual Encounters  Spiritual Needs Prayer  Stress Factors  Patient Stress Factors Health changes  Family Stress Factors Health changes

## 2020-05-29 NOTE — Plan of Care (Signed)
Alert and oriented patient admitted with signs and symptoms of a stroke with right sided weakness and tingling/numbness to right upper and lower extremities. Patient also c/o right sided vision loss and tongue tingling/numbness. NIH was a 5 on initial assessment, numbness improved throughout the shift. Patient continues to c/o tingling to right arm and leg; also continues to c/o right sided tongue tingling. Pt is NPO per stroke orders; [facility] swallow screen completed in order for patient to take PO PRN medications. Patient has c/o headache since arrival to ED, Norco 1 tab given with some improvement noted also tylenol given PRN with improvement but headache has not completely resolved. Patient instructed to call if headache becomes worse than original complaint or any of her symptoms change or worsen. Stroke education provided and stroke booklet given to patient.

## 2020-05-29 NOTE — Progress Notes (Signed)
Patient has completed Q1 hour NIHSS checks x 16 at 1700 this day. Patient has progressed from an NIHSS of 4 at the start of the shift to a 2 at the end of the shift.  No drift of any of the extremities.  Slight "shadow" of blurriness reported to the right eye and tinglings of the RUE and RLE with the RLE being more intense then the RUE.  Patient has complained of back pain this shift and treated with Norco which was effective for relief.  Patient has had multiple episodes of small bowel movements which she has complained of gas and feeling as if she had to go to the bathroom frequently.  Patient is very emotional and husband is at bedside.  Comfort and support provided to the patient and husband.

## 2020-05-29 NOTE — Progress Notes (Signed)
CHIEF COMPLAINT:   Chief Complaint  Patient presents with  . Code Stroke    Subjective  Alert and oriented patient admitted with signs and symptoms of a stroke with right sided weakness and tingling/numbness to right upper and lower extremities. Patient also c/o right sided vision loss and tongue tingling/numbness. NIH was a 5 on initial assessment, numbness improved throughout the shift.    Pt is NPO per stroke orders; [facility] swallow screen completed in order for patient to take PO PRN medications.   CVA symptoms improving      Review of Systems:  Gen:  Denies  fever, sweats, chills weight loss  HEENT: Denies blurred vision, double vision, ear pain, eye pain, hearing loss, nose bleeds, sore throat Cardiac:  No dizziness, chest pain or heaviness, chest tightness,edema, No JVD Resp:   No cough, -sputum production, -shortness of breath,-wheezing, -hemoptysis,  NEURO-alert and awake  Other:  All other systems negative  Objective   Examination:  General exam: Appears calm and comfortable  Respiratory system: Clear to auscultation. Respiratory effort normal. HEENT: [place]/AT, PERRLA, no thrush, no stridor. Cardiovascular system: S1 & S2 heard, RRR. No JVD, murmurs, rubs, gallops or clicks. No pedal edema. Gastrointestinal system: Abdomen is nondistended, soft and nontender. No organomegaly or masses felt. Normal bowel sounds heard. Central nervous system: Alert and oriented.  minimal weakness RT arm Extremities: Symmetric 5 x 5 power. Skin: No rashes, lesions or ulcers Psychiatry: Judgement and insight appear normal. Mood & affect appropriate.   VITALS:  height is 5' (1.524 m) and weight is 69.1 kg. Her oral temperature is 98.1 F (36.7 C). Her blood pressure is 118/81 and her pulse is 64. Her respiration is 18 and oxygen saturation is 99%.   I personally reviewed Labs under Results section.  Radiology Reports CT HEAD CODE STROKE WO CONTRAST  Result Date:  05/28/2020 CLINICAL DATA:  Code stroke.  Right vision loss EXAM: CT HEAD WITHOUT CONTRAST TECHNIQUE: Contiguous axial images were obtained from the base of the skull through the vertex without intravenous contrast. COMPARISON:  None. FINDINGS: Brain: No acute intracranial hemorrhage, mass effect, or edema. Gray-white differentiation is preserved. There is no extra-axial fluid collection. Ventricles and sulci are normal in size and configuration. Vascular: No hyperdense vessel. Minor intracranial atherosclerotic calcification at the skull base. Skull: Unremarkable. Sinuses/Orbits: Visualized orbits are unremarkable. Trace paranasal sinus mucosal thickening. Other: Mastoid air cells are clear. ASPECTS ([place] Stroke Program Early CT Score) - Ganglionic level infarction (caudate, lentiform nuclei, internal capsule, insula, M1-M3 cortex): 7 - Supraganglionic infarction (M4-M6 cortex): 3 Total score (0-10 with 10 being normal): 10 IMPRESSION: No acute intracranial hemorrhage or evidence of acute infarction. ASPECT score is 10. These results were called by telephone at the time of interpretation on 05/28/2020 at 4:24 pm to provider Shaune Pollack , who verbally acknowledged these results. Electronically Signed   By: Guadlupe Spanish M.D.   On: 05/28/2020 16:24   CT ANGIO HEAD CODE STROKE  Result Date: 05/28/2020 CLINICAL DATA:  Code stroke follow-up, right vision loss EXAM: CT ANGIOGRAPHY HEAD AND NECK TECHNIQUE: Multidetector CT imaging of the head and neck was performed using the standard protocol during bolus administration of intravenous contrast. Multiplanar CT image reconstructions and MIPs were obtained to evaluate the vascular anatomy. Carotid stenosis measurements (when applicable) are obtained utilizing NASCET criteria, using the distal internal carotid diameter as the denominator. CONTRAST:  55mL OMNIPAQUE IOHEXOL 350 MG/ML SOLN COMPARISON:  None. FINDINGS: CTA NECK Aortic arch: Great vessel  origins are  patent. Right carotid system: Patent. Mild calcified plaque at the ICA origin causing minimal stenosis. Left carotid system: Patent. Mild calcified plaque at the ICA origin causing minimal stenosis. Vertebral arteries: Patent and codominant. Skeleton: Degenerative changes of the cervical spine. Other neck: No mass or adenopathy. Upper chest: No apical lung mass. Review of the MIP images confirms the above findings CTA HEAD Anterior circulation: Intracranial internal carotid arteries are patent with mild calcified plaque. Anterior and middle cerebral arteries are patent. Posterior circulation: Intracranial vertebral arteries, basilar artery, and posterior cerebral arteries are patent. Moderate to marked short segment stenosis of the proximal left P2 PCA. There is a right posterior communicating artery present. Venous sinuses: Patent as allowed by contrast bolus timing. Review of the MIP images confirms the above findings IMPRESSION: No large vessel occlusion or evidence of dissection. No hemodynamically significant stenosis in the neck. Moderate to marked short segment stenosis of the proximal left P2 PCA. Electronically Signed   By: Guadlupe Spanish M.D.   On: 05/28/2020 18:14   CT ANGIO NECK CODE STROKE  Result Date: 05/28/2020 CLINICAL DATA:  Code stroke follow-up, right vision loss EXAM: CT ANGIOGRAPHY HEAD AND NECK TECHNIQUE: Multidetector CT imaging of the head and neck was performed using the standard protocol during bolus administration of intravenous contrast. Multiplanar CT image reconstructions and MIPs were obtained to evaluate the vascular anatomy. Carotid stenosis measurements (when applicable) are obtained utilizing NASCET criteria, using the distal internal carotid diameter as the denominator. CONTRAST:  64mL OMNIPAQUE IOHEXOL 350 MG/ML SOLN COMPARISON:  None. FINDINGS: CTA NECK Aortic arch: Great vessel origins are patent. Right carotid system: Patent. Mild calcified plaque at the ICA origin  causing minimal stenosis. Left carotid system: Patent. Mild calcified plaque at the ICA origin causing minimal stenosis. Vertebral arteries: Patent and codominant. Skeleton: Degenerative changes of the cervical spine. Other neck: No mass or adenopathy. Upper chest: No apical lung mass. Review of the MIP images confirms the above findings CTA HEAD Anterior circulation: Intracranial internal carotid arteries are patent with mild calcified plaque. Anterior and middle cerebral arteries are patent. Posterior circulation: Intracranial vertebral arteries, basilar artery, and posterior cerebral arteries are patent. Moderate to marked short segment stenosis of the proximal left P2 PCA. There is a right posterior communicating artery present. Venous sinuses: Patent as allowed by contrast bolus timing. Review of the MIP images confirms the above findings IMPRESSION: No large vessel occlusion or evidence of dissection. No hemodynamically significant stenosis in the neck. Moderate to marked short segment stenosis of the proximal left P2 PCA. Electronically Signed   By: Guadlupe Spanish M.D.   On: 05/28/2020 18:14       Assessment/Plan:  Acute CVA s/p TPA Follow  NEURO protocol Symptoms improving Transfer to [facility] 7/20    LOS: 1 day   Kerri Steele

## 2020-05-30 ENCOUNTER — Inpatient Hospital Stay
Admit: 2020-05-30 | Discharge: 2020-05-30 | Disposition: A | Discharge status: Home / Self Care | Payer: Medicare Other | Attending: Neurology | Admitting: Neurology

## 2020-05-30 ENCOUNTER — Inpatient Hospital Stay: Payer: Medicare Other

## 2020-05-30 LAB — CBC
HCT: 41.2 % (ref 36.0–46.0)
Hemoglobin: 12.7 g/dL (ref 12.0–15.0)
MCH: 24.1 pg — ABNORMAL LOW (ref 26.0–34.0)
MCHC: 30.8 g/dL (ref 30.0–36.0)
MCV: 78 fL — ABNORMAL LOW (ref 80.0–100.0)
Platelets: 330 10*3/uL (ref 150–400)
RBC: 5.28 MIL/uL — ABNORMAL HIGH (ref 3.87–5.11)
RDW: 16.1 % — ABNORMAL HIGH (ref 11.5–15.5)
WBC: 10 10*3/uL (ref 4.0–10.5)
nRBC: 0 % (ref 0.0–0.2)

## 2020-05-30 LAB — COMPREHENSIVE METABOLIC PANEL
ALT: 22 U/L (ref 0–44)
AST: 23 U/L (ref 15–41)
Albumin: 4 g/dL (ref 3.5–5.0)
Alkaline Phosphatase: 80 U/L (ref 38–126)
Anion gap: 15 (ref 5–15)
BUN: 17 mg/dL (ref 6–20)
CO2: 22 mmol/L (ref 22–32)
Calcium: 9.7 mg/dL (ref 8.9–10.3)
Chloride: 107 mmol/L (ref 98–111)
Creatinine, Ser: 0.8 mg/dL (ref 0.44–1.00)
GFR calc Af Amer: >60 mL/min (ref >60)
GFR calc non Af Amer: >60 mL/min (ref >60)
Glucose, Bld: 111 mg/dL — ABNORMAL HIGH (ref 70–99)
Potassium: 3.5 mmol/L (ref 3.5–5.1)
Sodium: 144 mmol/L (ref 135–145)
Total Bilirubin: 0.7 mg/dL (ref 0.3–1.2)
Total Protein: 7.4 g/dL (ref 6.5–8.1)

## 2020-05-30 LAB — LIPID PANEL
Cholesterol: 155 mg/dL (ref 0–200)
HDL: 50 mg/dL (ref >40)
LDL Cholesterol: 86 mg/dL (ref 0–99)
Total CHOL/HDL Ratio: 3.1 RATIO
Triglycerides: 97 mg/dL (ref <150)
VLDL: 19 mg/dL (ref 0–40)

## 2020-05-30 LAB — ECHOCARDIOGRAM COMPLETE BUBBLE STUDY
AR max vel: 1.69 cm2
AV Area VTI: 1.66 cm2
AV Area mean vel: 1.75 cm2
AV Mean grad: 5.5 mmHg
AV Peak grad: 9.6 mmHg
Ao pk vel: 1.55 m/s
Area-P 1/2: 3.34 cm2
S' Lateral: 2.32 cm

## 2020-05-30 LAB — MAGNESIUM: Magnesium: 2.4 mg/dL (ref 1.7–2.4)

## 2020-05-30 LAB — HEMOGLOBIN A1C
Hgb A1c MFr Bld: 5.9 % — ABNORMAL HIGH (ref 4.8–5.6)
Mean Plasma Glucose: 123 mg/dL

## 2020-05-30 LAB — PHOSPHORUS: Phosphorus: 3.2 mg/dL (ref 2.5–4.6)

## 2020-05-30 MED ORDER — LOPERAMIDE HCL 2 MG PO CAPS
2.0000 mg | ORAL_CAPSULE | ORAL | Status: DC | PRN
  Administered 2020-05-30 – 2020-05-31 (×2): 2 mg via ORAL
  Filled 2020-05-30 (×2): qty 1

## 2020-05-30 MED ORDER — LORAZEPAM 2 MG/ML IJ SOLN
0.5000 mg | Freq: Once | INTRAMUSCULAR | Status: AC
  Administered 2020-05-30: 0.5 mg via INTRAVENOUS
  Filled 2020-05-30: qty 1

## 2020-05-30 MED ORDER — MAGNESIUM HYDROXIDE 400 MG/5ML PO SUSP
30.0000 mL | Freq: Every day | ORAL | Status: DC | PRN
  Filled 2020-05-30: qty 30

## 2020-05-30 MED ORDER — ASPIRIN EC 81 MG PO TBEC
81.0000 mg | DELAYED_RELEASE_TABLET | Freq: Every day | ORAL | Status: DC
  Administered 2020-05-30 – 2020-06-01 (×3): 81 mg via ORAL
  Filled 2020-05-30 (×3): qty 1

## 2020-05-30 NOTE — Consult Note (Signed)
Requesting Physician: Georgeann OppenheimSreenath    Chief Complaint: Right sided weakness  I have been asked by Dr. Georgeann OppenheimSreenath to see this patient in consultation for acute infarct.  HPI: Kerri Steele is an 59 y.o. female with a history of COPD, DM, HTN, hypothyroidism, sleep apnea and depression who reports being under a lot of stress recently.  Went to bed Saturday feeling poorly.  On Sunday after awakening was noted to have difficulty walking.  Experienced loss of vision bilaterally for a short period of time.  Then noted right sided weakness.  Patient was brought in for evaluation and seen by teleneurology.  TPA administered.    Date last known well: 05/28/2020 Time last known well: Time: 13:00 tPA Given: Yes  Past Medical History:  Diagnosis Date  . Anginal pain (HCC)   . Anxiety   . Asthma   . COPD (chronic obstructive pulmonary disease) (HCC)   . Depression   . Diabetes mellitus without complication (HCC)   . GERD (gastroesophageal reflux disease)   . Hypertension   . Hypothyroidism   . Pneumonia   . Shortness of breath dyspnea   . Sleep apnea     Past Surgical History:  Procedure Laterality Date  . ANKLE SURGERY    . BLADDER SURGERY    . CARDIAC CATHETERIZATION Left 08/23/2015   Procedure: Left Heart Cath and Coronary Angiography;  Surgeon: Dalia HeadingKenneth A Fath, MD;  Location: [facility] INVASIVE CV LAB;  Service: Cardiovascular;  Laterality: Left;  . ESOPHAGEAL DILATION    . TUBAL LIGATION    . WRIST SURGERY      Family History  Problem Relation Age of Onset  . Heart disease Father   . Hypertension Mother   . Diabetes Mother   . Lupus Mother   . Atrial fibrillation Mother    Social History:  reports that she has quit smoking. She has never used smokeless tobacco. She reports that she does not drink alcohol and does not use drugs.  Allergies:  Allergies  Allergen Reactions  . Metformin And Related Other (See Comments)    migraine  . Morphine And Related Hives  . Other Other (See  Comments)    Latex allergy  . Sulfa Antibiotics Other (See Comments)    Medications:  I have reviewed the patient's current medications. Prior to Admission:  Medications Prior to Admission  Medication Sig Dispense Refill Last Dose  . DULoxetine (CYMBALTA) 60 MG capsule Take 60 mg by mouth daily.   05/28/2020 at 0800  . glipiZIDE (GLUCOTROL XL) 10 MG 24 hr tablet Take 10 mg by mouth daily with breakfast.   05/28/2020 at 0800  . HYDROcodone-acetaminophen (NORCO) 5-325 MG tablet Take 1 tablet by mouth every 6 (six) hours as needed for severe pain. 7 tablet 0 05/27/2020 at 2000  . Ipratropium-Albuterol (COMBIVENT RESPIMAT) 20-100 MCG/ACT AERS respimat Inhale 1 puff into the lungs every 6 (six) hours.   05/28/2020 at 1000  . magnesium oxide (MAG-OX) 400 MG tablet Take 400 mg by mouth 2 (two) times daily.    05/28/2020 at 0800  . methocarbamol (ROBAXIN) 750 MG tablet Take 750 mg by mouth 3 (three) times daily.    05/28/2020 at 1200  . omeprazole (PRILOSEC) 20 MG capsule Take 20 mg by mouth daily.   05/28/2020 at 0800  . pregabalin (LYRICA) 150 MG capsule Take 150 mg by mouth 2 (two) times daily.   05/28/2020 at 0800  . tiotropium (SPIRIVA) 18 MCG inhalation capsule Place 18 mcg into inhaler and  inhale daily.   05/28/2020 at 0900  . traMADol (ULTRAM) 50 MG tablet Take 50 mg by mouth every 6 (six) hours as needed for moderate pain.   05/28/2020 at 0800  . verapamil (CALAN-SR) 120 MG CR tablet Take 120 mg by mouth daily.   05/27/2020 at Unknown time  . arformoterol (BROVANA) 15 MCG/2ML NEBU Take 15 mcg by nebulization 2 (two) times daily.   prn at prn  . atorvastatin (LIPITOR) 20 MG tablet Take 20 mg by mouth daily. (Patient not taking: Reported on 05/28/2020)   Not Taking at Unknown time  . sucralfate (CARAFATE) 1 g tablet Take 1 tablet (1 g total) by mouth 4 (four) times daily. 60 tablet 1    Scheduled: . atorvastatin  40 mg Oral Daily  . Chlorhexidine Gluconate Cloth  6 each Topical Daily  . DULoxetine  60  mg Oral Daily  . pantoprazole  40 mg Oral Daily  . sodium chloride flush  3 mL Intravenous Once  . sodium chloride flush  3 mL Intravenous Q12H  . tiotropium  18 mcg Inhalation Daily    ROS: History obtained from the patient  General ROS: negative for - chills, fatigue, fever, night sweats, weight gain or weight loss Psychological ROS: negative for - behavioral disorder, hallucinations, memory difficulties, mood swings or suicidal ideation Ophthalmic ROS: as noted in HPI ENT ROS: negative for - epistaxis, nasal discharge, oral lesions, sore throat, tinnitus or vertigo Allergy and Immunology ROS: negative for - hives or itchy/watery eyes Hematological and Lymphatic ROS: negative for - bleeding problems, bruising or swollen lymph nodes Endocrine ROS: negative for - galactorrhea, hair pattern changes, polydipsia/polyuria or temperature intolerance Respiratory ROS: negative for - cough, hemoptysis, shortness of breath or wheezing Cardiovascular ROS: negative for - chest pain, dyspnea on exertion, edema or irregular heartbeat Gastrointestinal ROS: negative for - abdominal pain, diarrhea, hematemesis, nausea/vomiting or stool incontinence Genito-Urinary ROS: negative for - dysuria, hematuria, incontinence or urinary frequency/urgency Musculoskeletal ROS: negative for - joint swelling or muscular weakness Neurological ROS: as noted in HPI, left leg neuropathy Dermatological ROS: negative for rash and skin lesion changes  Physical Examination: Blood pressure (!) 155/74, pulse 98, temperature 98 F (36.7 C), temperature source Oral, resp. rate 18, height 5' (1.524 m), weight 69.2 kg, SpO2 98 %.  HEENT-  Normocephalic, no lesions, without obvious abnormality.  Normal external eye and conjunctiva.  Normal TM's bilaterally.  Normal auditory canals and external ears. Normal external nose, mucus membranes and septum.  Normal pharynx. Cardiovascular- S1, S2 normal, pulses palpable throughout   Lungs-  chest clear, no wheezing, rales, normal symmetric air entry Abdomen- soft, non-tender; bowel sounds normal; no masses,  no organomegaly Extremities- no edema Lymph-no adenopathy palpable Musculoskeletal-no joint tenderness, deformity or swelling Skin-warm and dry, no hyperpigmentation, vitiligo, or suspicious lesions  Neurological Examination   Mental Status: Alert, oriented, thought content appropriate.  Speech fluent without evidence of aphasia.  Able to follow 3 step commands without difficulty.  Flat affect Cranial Nerves: II: Visual fields grossly normal, pupils equal, round, reactive to light and accommodation III,IV, VI: ptosis not present, extra-ocular motions intact bilaterally V,VII: smile symmetric, facial light touch sensation normal bilaterally VIII: hearing normal bilaterally IX,X: gag reflex present XI: bilateral shoulder shrug XII: midline tongue extension Motor: Generalized weakness throughout with give-way weakness noted as well.  No focal weakness or drift appreciated.   Sensory: Pinprick and light touch decreased in the RUE Deep Tendon Reflexes: Symmetric throughout Plantars: Right: mute  Left: mute Cerebellar: Normal finger-to-nose and normal heel-to-shin testing bilaterally Gait: not tested due to safety concerns    Laboratory Studies:  Basic Metabolic Panel: Recent Labs  Lab 05/28/20 1625 05/29/20 0439 05/30/20 0602  NA 140 143 144  K 3.4* 3.1* 3.5  CL 104 107 107  CO2 27 30 22   GLUCOSE 108* 79 111*  BUN 18 14 17   CREATININE 1.01* 0.99 0.80  CALCIUM 9.6 9.0 9.7  MG  --  2.3 2.4  PHOS  --  3.9 3.2    Liver Function Tests: Recent Labs  Lab 05/28/20 1625 05/30/20 0602  AST 30 23  ALT 27 22  ALKPHOS 94 80  BILITOT 0.6 0.7  PROT 7.4 7.4  ALBUMIN 4.3 4.0   No results for input(s): LIPASE, AMYLASE in the last 168 hours. No results for input(s): AMMONIA in the last 168 hours.  CBC: Recent Labs  Lab 05/28/20 1625 05/29/20 0439  05/30/20 0602  WBC 11.1* 7.7 10.0  NEUTROABS 6.4  --   --   HGB 12.7 11.6* 12.7  HCT 41.1 38.1 41.2  MCV 79.0* 79.2* 78.0*  PLT 347 299 330    Cardiac Enzymes: No results for input(s): CKTOTAL, CKMB, CKMBINDEX, TROPONINI in the last 168 hours.  BNP: Invalid input(s): POCBNP  CBG: Recent Labs  Lab 05/28/20 1620 05/28/20 1954  GLUCAP 97 128*    Microbiology: Results for orders placed or performed during the hospital encounter of 05/28/20  SARS Coronavirus 2 by RT PCR (hospital order, performed in [facility] hospital lab) Nasopharyngeal Nasopharyngeal Swab     Status: None   Collection Time: 05/28/20  6:15 PM   Specimen: Nasopharyngeal Swab  Result Value Ref Range Status   SARS Coronavirus 2 NEGATIVE NEGATIVE Final    Comment: (NOTE) SARS-CoV-2 target nucleic acids are NOT DETECTED.  The SARS-CoV-2 RNA is generally detectable in upper and lower respiratory specimens during the acute phase of infection. The lowest concentration of SARS-CoV-2 viral copies this assay can detect is 250 copies / mL. A negative result does not preclude SARS-CoV-2 infection and should not be used as the sole basis for treatment or other patient management decisions.  A negative result may occur with improper specimen collection / handling, submission of specimen other than nasopharyngeal swab, presence of viral mutation(s) within the areas targeted by this assay, and inadequate number of viral copies (<250 copies / mL). A negative result must be combined with clinical observations, patient history, and epidemiological information.  Fact Sheet for Patients:   [facility]  Fact Sheet for Healthcare Providers: 05/30/20  This test is not yet approved or  cleared by the [employer] FDA and has been authorized for detection and/or diagnosis of SARS-CoV-2 by FDA under an Emergency Use Authorization (EUA).  This EUA will remain in  effect (meaning this test can be used) for the duration of the COVID-19 declaration under Section 564(b)(1) of the Act, 21 U.S.C. section 360bbb-3(b)(1), unless the authorization is terminated or revoked sooner.  Performed at [facility], [address] [place], [address] [place]   MRSA PCR Screening     Status: None   Collection Time: 05/28/20  7:55 PM   Specimen: Nasal Mucosa; Nasopharyngeal  Result Value Ref Range Status   MRSA by PCR NEGATIVE NEGATIVE Final    Comment:        The GeneXpert MRSA Assay (FDA approved for NASAL specimens only), is one component of a comprehensive MRSA colonization surveillance program. It is not  intended to diagnose MRSA infection nor to guide or monitor treatment for MRSA infections. Performed at [facility], [address] [place], [place] [address]     Coagulation Studies: Recent Labs    05/28/20 1625  LABPROT 11.6  INR 0.9    Urinalysis: No results for input(s): COLORURINE, LABSPEC, PHURINE, GLUCOSEU, HGBUR, BILIRUBINUR, KETONESUR, PROTEINUR, UROBILINOGEN, NITRITE, LEUKOCYTESUR in the last 168 hours.  Invalid input(s): APPERANCEUR  Lipid Panel:    Component Value Date/Time   CHOL 155 05/30/2020 0602   TRIG 97 05/30/2020 0602   HDL 50 05/30/2020 0602   CHOLHDL 3.1 05/30/2020 0602   VLDL 19 05/30/2020 0602   LDLCALC 86 05/30/2020 0602    HgbA1C: No results found for: HGBA1C  Urine Drug Screen:  No results found for: LABOPIA, COCAINSCRNUR, LABBENZ, AMPHETMU, THCU, LABBARB  Alcohol Level: No results for input(s): ETH in the last 168 hours.  Other results: EKG: normal sinus rhythm at 74 bpm.  Imaging: DG Chest Port 1 View  Result Date: 05/29/2020 CLINICAL DATA:  Acute CVA. EXAM: PORTABLE CHEST 1 VIEW COMPARISON:  01/08/2007 FINDINGS: The cardiac silhouette, mediastinal and hilar contours are within normal limits. The lungs are clear. No pleural effusion. The bony thorax is intact. IMPRESSION: No  acute cardiopulmonary findings. Electronically Signed   By: Rudie Meyer M.D.   On: 05/29/2020 07:37   CT HEAD CODE STROKE WO CONTRAST  Result Date: 05/28/2020 CLINICAL DATA:  Code stroke.  Right vision loss EXAM: CT HEAD WITHOUT CONTRAST TECHNIQUE: Contiguous axial images were obtained from the base of the skull through the vertex without intravenous contrast. COMPARISON:  None. FINDINGS: Brain: No acute intracranial hemorrhage, mass effect, or edema. Gray-white differentiation is preserved. There is no extra-axial fluid collection. Ventricles and sulci are normal in size and configuration. Vascular: No hyperdense vessel. Minor intracranial atherosclerotic calcification at the skull base. Skull: Unremarkable. Sinuses/Orbits: Visualized orbits are unremarkable. Trace paranasal sinus mucosal thickening. Other: Mastoid air cells are clear. ASPECTS (Alberta Stroke Program Early CT Score) - Ganglionic level infarction (caudate, lentiform nuclei, internal capsule, insula, M1-M3 cortex): 7 - Supraganglionic infarction (M4-M6 cortex): 3 Total score (0-10 with 10 being normal): 10 IMPRESSION: No acute intracranial hemorrhage or evidence of acute infarction. ASPECT score is 10. These results were called by telephone at the time of interpretation on 05/28/2020 at 4:24 pm to provider Shaune Pollack , who verbally acknowledged these results. Electronically Signed   By: Guadlupe Spanish M.D.   On: 05/28/2020 16:24   CT ANGIO HEAD CODE STROKE  Result Date: 05/28/2020 CLINICAL DATA:  Code stroke follow-up, right vision loss EXAM: CT ANGIOGRAPHY HEAD AND NECK TECHNIQUE: Multidetector CT imaging of the head and neck was performed using the standard protocol during bolus administration of intravenous contrast. Multiplanar CT image reconstructions and MIPs were obtained to evaluate the vascular anatomy. Carotid stenosis measurements (when applicable) are obtained utilizing NASCET criteria, using the distal internal carotid  diameter as the denominator. CONTRAST:  68mL OMNIPAQUE IOHEXOL 350 MG/ML SOLN COMPARISON:  None. FINDINGS: CTA NECK Aortic arch: Great vessel origins are patent. Right carotid system: Patent. Mild calcified plaque at the ICA origin causing minimal stenosis. Left carotid system: Patent. Mild calcified plaque at the ICA origin causing minimal stenosis. Vertebral arteries: Patent and codominant. Skeleton: Degenerative changes of the cervical spine. Other neck: No mass or adenopathy. Upper chest: No apical lung mass. Review of the MIP images confirms the above findings CTA HEAD Anterior circulation: Intracranial internal carotid arteries are patent with  mild calcified plaque. Anterior and middle cerebral arteries are patent. Posterior circulation: Intracranial vertebral arteries, basilar artery, and posterior cerebral arteries are patent. Moderate to marked short segment stenosis of the proximal left P2 PCA. There is a right posterior communicating artery present. Venous sinuses: Patent as allowed by contrast bolus timing. Review of the MIP images confirms the above findings IMPRESSION: No large vessel occlusion or evidence of dissection. No hemodynamically significant stenosis in the neck. Moderate to marked short segment stenosis of the proximal left P2 PCA. Electronically Signed   By: Guadlupe Spanish M.D.   On: 05/28/2020 18:14   CT ANGIO NECK CODE STROKE  Result Date: 05/28/2020 CLINICAL DATA:  Code stroke follow-up, right vision loss EXAM: CT ANGIOGRAPHY HEAD AND NECK TECHNIQUE: Multidetector CT imaging of the head and neck was performed using the standard protocol during bolus administration of intravenous contrast. Multiplanar CT image reconstructions and MIPs were obtained to evaluate the vascular anatomy. Carotid stenosis measurements (when applicable) are obtained utilizing NASCET criteria, using the distal internal carotid diameter as the denominator. CONTRAST:  75mL OMNIPAQUE IOHEXOL 350 MG/ML SOLN  COMPARISON:  None. FINDINGS: CTA NECK Aortic arch: Great vessel origins are patent. Right carotid system: Patent. Mild calcified plaque at the ICA origin causing minimal stenosis. Left carotid system: Patent. Mild calcified plaque at the ICA origin causing minimal stenosis. Vertebral arteries: Patent and codominant. Skeleton: Degenerative changes of the cervical spine. Other neck: No mass or adenopathy. Upper chest: No apical lung mass. Review of the MIP images confirms the above findings CTA HEAD Anterior circulation: Intracranial internal carotid arteries are patent with mild calcified plaque. Anterior and middle cerebral arteries are patent. Posterior circulation: Intracranial vertebral arteries, basilar artery, and posterior cerebral arteries are patent. Moderate to marked short segment stenosis of the proximal left P2 PCA. There is a right posterior communicating artery present. Venous sinuses: Patent as allowed by contrast bolus timing. Review of the MIP images confirms the above findings IMPRESSION: No large vessel occlusion or evidence of dissection. No hemodynamically significant stenosis in the neck. Moderate to marked short segment stenosis of the proximal left P2 PCA. Electronically Signed   By: Guadlupe Spanish M.D.   On: 05/28/2020 18:14    Assessment: 59 y.o. female with a history of COPD, DM, HTN, hypothyroidism, sleep apnea and depression who reports being under a lot of stress recently.  Went to bed Saturday feeling poorly.  On Sunday after awakening was noted to have difficulty walking, change in vision and right sided weakness.  Acute infarct suspected.  tPA administered.  Initial head CT personally reviewed and shows no acute changes.  CTA of the head and neck shows no evidence of LVO with moderate short segment stenosis of the left P2.  Patient currently improved.  Further work up recommended.    Stroke Risk Factors - diabetes mellitus and hypertension  Plan: 1. HgbA1c, fasting lipid  panel 2. MRI of the brain without contrast.  If no evidence of hemorrhage will start ASA at  daily 3. PT consult, OT consult, Speech consult 4. Echocardiogram with bubble study 5. NPO until RN stroke swallow screen 6. Telemetry monitoring 7. Frequent neuro checks  Thana Farr, MD Neurology [phone] 05/30/2020, 9:45 AM

## 2020-05-30 NOTE — Progress Notes (Signed)
*  PRELIMINARY RESULTS* Echocardiogram 2D Echocardiogram has been performed.  Kerri Steele 05/30/2020, 10:38 AM

## 2020-05-30 NOTE — Progress Notes (Signed)
PT Cancellation Note  Patient Details Name: Kerri Steele MRN: [id number] DOB: 06-15-1961   Cancelled Treatment:    Reason Eval/Treat Not Completed: Other (comment). Pt pending doppler, will re-attempt once medically cleared.   Gaston Dase 05/30/2020, 1:33 PM Elizabeth Palau, PT, DPT [phone]

## 2020-05-30 NOTE — Progress Notes (Signed)
SLP Cancellation Note  Patient Details Name: Kerri Steele MRN: [id number] DOB: November 13, 1960   Cancelled treatment:       Reason Eval/Treat Not Completed:  (chart reviewed; consulted NSG then met w/ pt/family).  Followed up w/ pt for diet toleration post initial eval. Pt denied any difficulty swallowing and is currently on a regular diet; tolerates swallowing pills w/ water per NSG and pt reports. Pt conversed at conversational level w/out overt deficits noted; pt and family denied any speech-language deficits. Pt stated she did not have much appetite; "nerves". Encouraged pt to pick 1-2 items from the Menu (provided) when able to keep her energy up. Pt agreed. No further skilled ST services indicated as pt appears at her baseline. Pt agreed. NSG to reconsult if any needs arise.    Kerri Kenner, MS, CCC-SLP Robertlee Rogacki 05/30/2020, 12:42 PM

## 2020-05-30 NOTE — Progress Notes (Signed)
OT Cancellation Note  Patient Details Name: Malaika Arnall MRN: [id number] DOB: 05-19-1961   Cancelled Treatment:    Reason Eval/Treat Not Completed: Medical issues which prohibited therapy. Consult received, chart reviewed. Pt noted with pending imaging to rule out bilateral DVT. Will hold OT evaluation and re-attempt at later date/time as medically appropriate.   Richrd Prime, MPH, MS, OTR/L ascom [phone] 05/30/20, 1:17 PM

## 2020-05-30 NOTE — Progress Notes (Signed)
Brief note for transfer of care to [facility] as of 05/30/20  Hospital Course per [facility] service    59 y.o.femaleWith PMHx HTN, HLD, COPD, DM presented to ED with stroke symptoms, last known normal at 2pm that day.  Symptoms included decreased vision, numbness of the right side of the tongue with facial droop, and right-sided upper and lower extremity weakness.  Symptoms persisted.  In the ED, was evaluated by teleneurology.  Ultimately was treated with tPA and subsequently admitted to ICU for monitoring.  Patient has remained stable.  Hospitalist service will assume care tomorrow 7/20.  TELE NEUROLOGIST ASSESSMENT "appears to have right-sided weakness including right arm and leg with drift. We talked in detail about possibility of stroke as cause of this. We talked about IV alteplase. I explained to her in detail the risks of bleeding and other complications with alteplase. We talked about 6% risk of bleeding and 1 to 3% risk of fatality. We talked about allergic reaction. We talked about pros and cons and after discussing this in detail, patient and the husband both wanted to proceed with IV alteplase. This was given without any problems. Before IV alteplase was given, she was complaining of a headache and I gave her an order for IV Tylenol to see if this would help. She will also go for CT angiograms after alteplase. I will follow-up on the results. All this was discussed in detail with the family and all their questions and concerns were addressed. She will be admitted to the hospital for further work-up with MRI of the head, echocardiogram and telemetry monitoring along with PT, OT and speech eval's and neurology follow-up"   No charge

## 2020-05-30 NOTE — Progress Notes (Signed)
PROGRESS NOTE    Kerri Steele  GEX:[id number] DOB: 1961/10/27 DOA: 05/28/2020 PCP: Erasmo Downer, NP    Brief Narrative: 59 y.o.femaleWith PMHx HTN, HLD, COPD, DM presented to ED with stroke symptoms, last known normal at 2pm that day.  Symptoms included decreased vision, numbness of the right side of the tongue with facial droop, and right-sided upper and lower extremity weakness.  Symptoms persisted.  In the ED, was evaluated by teleneurology.  Ultimately was treated with tPA and subsequently admitted to ICU for monitoring.  Patient has remained stable.  7/20: Patient seen and examined.  Neurologically improving.  Seen by inpatient neurology this morning.  MRI and echocardiogram ordered.  Therapy evaluations ordered and pending.  Patient herself feels okay.  Without complaint.  Still with some residual blurred vision otherwise is neurologically improving.   Assessment & Plan:   Active Problems:   Stroke [facility])  Acute CVA status post TPA Symptoms improving over interval TPA administered on 05/28/2020 Seen by inpatient neurology Dr. Thad Ranger today Plan: MRI brain with and without contrast 2D echocardiogram PT and OT consults Depending on results of MRI neurology will add antiplatelet therapy as indicated  Hypertension Hold verapamil on hold considering recent CVA Continue holding for now Can likely restart in 24 to 48 hours  Diabetes Home oral regimen on hold Sliding scale coverage  COPD Not acutely exacerbated Continue nebulizer regimen  Hyperlipidemia Statin on hold Apparently not taking as per patient  Chronic pain As needed oxycodone   DVT prophylaxis: SCDs Code Status: Full Family Communication: None today Disposition Plan: Status is: Inpatient  Remains inpatient appropriate because:Inpatient level of care appropriate due to severity of illness   Dispo: The patient is from: Home              Anticipated d/c is to: Home              Anticipated d/c  date is: 2 days              Patient currently is not medically stable to d/c.  Continuing work-up for acute CVA status post TPA     Consultants:   Neurology  Procedures:   None  Antimicrobials:   None   Subjective: Patient seen and examined.  Neurologically improving over interval.  No complaints  Objective: Vitals:   05/30/20 0700 05/30/20 0800 05/30/20 1000 05/30/20 1100  BP: (!) 142/78 (!) 155/74  125/90  Pulse: 89 98 76 (!) 108  Resp: (!) 24 18 (!) 22 (!) 28  Temp:  98 F (36.7 C)    TempSrc:  Oral    SpO2: 98% 98% 98% 100%  Weight:      Height:       No intake or output data in the 24 hours ending 05/30/20 1406 Filed Weights   05/28/20 2000 05/29/20 0500 05/30/20 0500  Weight: 69.1 kg 69.1 kg 69.2 kg    Examination:  General exam: Appears calm and comfortable  Respiratory system: Clear to auscultation. Respiratory effort normal. Cardiovascular system: S1 & S2 heard, RRR. No JVD, murmurs, rubs, gallops or clicks. No pedal edema. Gastrointestinal system: Abdomen is nondistended, soft and nontender. No organomegaly or masses felt. Normal bowel sounds heard. Central nervous system: Alert and oriented, decreased sensation, no other focal deficits Extremities: Generalized weakness Skin: No rashes, lesions or ulcers Psychiatry: Judgement and insight appear normal.  Flattened affect    Data Reviewed: I have personally reviewed following labs and imaging studies  CBC: Recent Labs  Lab 05/28/20 1625 05/29/20 0439 05/30/20 0602  WBC 11.1* 7.7 10.0  NEUTROABS 6.4  --   --   HGB 12.7 11.6* 12.7  HCT 41.1 38.1 41.2  MCV 79.0* 79.2* 78.0*  PLT 347 299 330   Basic Metabolic Panel: Recent Labs  Lab 05/28/20 1625 05/29/20 0439 05/30/20 0602  NA 140 143 144  K 3.4* 3.1* 3.5  CL 104 107 107  CO2 GLUCOSE 108* 79 111*  BUN CREATININE 1.01* 0.99 0.80  CALCIUM 9.6 9.0 9.7  MG  --  2.3 2.4  PHOS  --  3.9 3.2   GFR: Estimated  Creatinine Clearance: 67.4 mL/min (by C-G formula based on SCr of 0.8 mg/dL). Liver Function Tests: Recent Labs  Lab 05/28/20 1625 05/30/20 0602  AST 30 23  ALT 27 22  ALKPHOS 94 80  BILITOT 0.6 0.7  PROT 7.4 7.4  ALBUMIN 4.3 4.0   No results for input(s): LIPASE, AMYLASE in the last 168 hours. No results for input(s): AMMONIA in the last 168 hours. Coagulation Profile: Recent Labs  Lab 05/28/20 1625  INR 0.9   Cardiac Enzymes: No results for input(s): CKTOTAL, CKMB, CKMBINDEX, TROPONINI in the last 168 hours. BNP (last 3 results) No results for input(s): PROBNP in the last 8760 hours. HbA1C: No results for input(s): HGBA1C in the last 72 hours. CBG: Recent Labs  Lab 05/28/20 1620 05/28/20 1954  GLUCAP 97 128*   Lipid Profile: Recent Labs    05/30/20 0602  CHOL 155  HDL 50  LDLCALC 86  TRIG 97  CHOLHDL 3.1   Thyroid Function Tests: No results for input(s): TSH, T4TOTAL, FREET4, T3FREE, THYROIDAB in the last 72 hours. Anemia Panel: No results for input(s): VITAMINB12, FOLATE, FERRITIN, TIBC, IRON, RETICCTPCT in the last 72 hours. Sepsis Labs: No results for input(s): PROCALCITON, LATICACIDVEN in the last 168 hours.  Recent Results (from the past 240 hour(s))  SARS Coronavirus 2 by RT PCR (hospital order, performed in [facility] hospital lab) Nasopharyngeal Nasopharyngeal Swab     Status: None   Collection Time: 05/28/20  6:15 PM   Specimen: Nasopharyngeal Swab  Result Value Ref Range Status   SARS Coronavirus 2 NEGATIVE NEGATIVE Final    Comment: (NOTE) SARS-CoV-2 target nucleic acids are NOT DETECTED.  The SARS-CoV-2 RNA is generally detectable in upper and lower respiratory specimens during the acute phase of infection. The lowest concentration of SARS-CoV-2 viral copies this assay can detect is 250 copies / mL. A negative result does not preclude SARS-CoV-2 infection and should not be used as the sole basis for treatment or other patient management  decisions.  A negative result may occur with improper specimen collection / handling, submission of specimen other than nasopharyngeal swab, presence of viral mutation(s) within the areas targeted by this assay, and inadequate number of viral copies (<250 copies / mL). A negative result must be combined with clinical observations, patient history, and epidemiological information.  Fact Sheet for Patients:   BoilerBrush.com.cy  Fact Sheet for Healthcare Providers: https://pope.com/  This test is not yet approved or  cleared by the [place] FDA and has been authorized for detection and/or diagnosis of SARS-CoV-2 by FDA under an Emergency Use Authorization (EUA).  This EUA will remain in effect (meaning this test can be used) for the duration of the COVID-19 declaration under Section 564(b)(1) of the Act, 21 U.S.C. section 360bbb-3(b)(1), unless the authorization is terminated or revoked sooner.  Performed  at [facility], [address] [place], [place] [address]   MRSA PCR Screening     Status: None   Collection Time: 05/28/20  7:55 PM   Specimen: Nasal Mucosa; Nasopharyngeal  Result Value Ref Range Status   MRSA by PCR NEGATIVE NEGATIVE Final    Comment:        The GeneXpert MRSA Assay (FDA approved for NASAL specimens only), is one component of a comprehensive MRSA colonization surveillance program. It is not intended to diagnose MRSA infection nor to guide or monitor treatment for MRSA infections. Performed at [facility], [address] [place], [place] [address]          Radiology Studies: MR BRAIN WO CONTRAST  Result Date: 05/30/2020 CLINICAL DATA:  Stroke, follow-up. EXAM: MRI HEAD WITHOUT CONTRAST TECHNIQUE: Multiplanar, multiecho pulse sequences of the brain and surrounding structures were obtained without intravenous contrast. COMPARISON:  05/28/2020 noncontrast head CT and CTA head  and neck. FINDINGS: Brain: Focal restricted diffusion involving the medial left occipital lobe with associated T2/FLAIR hyperintense signal (5:27) and mild gyral edema. Additional punctate foci of restricted diffusion are seen inferiorly within the medial left occipital lobe (5:21). No intracranial hemorrhage. No midline shift, ventriculomegaly or extra-axial fluid collection. No mass lesion. Vascular: Intracranial vessels are better evaluated on recent CTA head. Skull and upper cervical spine: Normal marrow signal. Sinuses/Orbits: Normal orbits. Clear paranasal sinuses. Trace right and small left mastoid effusions. Other: None. 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 These results were called by telephone at the time of interpretation on 05/30/2020 at 12:28 pm to provider [facility] , who verbally acknowledged these results. Electronically Signed   By: Stana Bunting M.D.   On: 05/30/2020 12:39   DG Chest Port 1 View  Result Date: 05/29/2020 CLINICAL DATA:  Acute CVA. EXAM: PORTABLE CHEST 1 VIEW COMPARISON:  01/08/2007 FINDINGS: The cardiac silhouette, mediastinal and hilar contours are within normal limits. The lungs are clear. No pleural effusion. The bony thorax is intact. IMPRESSION: No acute cardiopulmonary findings. Electronically Signed   By: Rudie Meyer M.D.   On: 05/29/2020 07:37   ECHOCARDIOGRAM COMPLETE BUBBLE STUDY  Result Date: 05/30/2020    ECHOCARDIOGRAM REPORT   Patient Name:   Kerri Steele Date of Exam: 05/30/2020 Medical Rec #:  [id number]   Height:       60.0 in Accession #:    [id number]  Weight:       152.6 lb Date of Birth:  [birth year]  BSA:          1.664 m Patient Age:    58 years    BP:           155/74 mmHg Patient Gender: F           HR:           98 bpm. Exam Location:  [facility] Procedure: 2D Echo, Cardiac Doppler, Color Doppler and Saline Contrast Bubble            Study Indications:     Stroke 434.91  History:         Patient has no prior  history of Echocardiogram examinations.                  COPD, Signs/Symptoms:Shortness of Breath and Dyspnea; Risk                  Factors:Hypertension and Diabetes.  Sonographer:     Cristela Blue RDCS (AE)  Referring Phys:  [id number] LESLIE REYNOLDS Diagnosing Phys: Arnoldo HookerBruce Kowalski MD  Sonographer Comments: Image acquisition challenging due to COPD. IMPRESSIONS  1. Left ventricular ejection fraction, by estimation, is 50 to 55%. The left ventricle has low normal function. The left ventricle has no regional wall motion abnormalities. Left ventricular diastolic parameters were normal.  2. Right ventricular systolic function is normal. The right ventricular size is normal. There is normal pulmonary artery systolic pressure.  3. The mitral valve is normal in structure. Trivial mitral valve regurgitation.  4. The aortic valve is normal in structure. Aortic valve regurgitation is trivial.  5. Evidence of atrial level shunting detected by color flow Doppler. Agitated saline contrast bubble study was positive with shunting observed within 3-6 cardiac cycles suggestive of interatrial shunt. There is a small patent foramen ovale with bidirectional shunting across atrial septum. FINDINGS  Left Ventricle: Left ventricular ejection fraction, by estimation, is 50 to 55%. The left ventricle has low normal function. The left ventricle has no regional wall motion abnormalities. The left ventricular internal cavity size was normal in size. There is no left ventricular hypertrophy. Left ventricular diastolic parameters were normal. Right Ventricle: The right ventricular size is normal. No increase in right ventricular wall thickness. Right ventricular systolic function is normal. There is normal pulmonary artery systolic pressure. The tricuspid regurgitant velocity is 1.87 m/s, and  with an assumed right atrial pressure of 10 mmHg, the estimated right ventricular systolic pressure is 24.0 mmHg. Left Atrium: Left atrial size was normal in  size. Right Atrium: Right atrial size was normal in size. Pericardium: There is no evidence of pericardial effusion. Mitral Valve: The mitral valve is normal in structure. Trivial mitral valve regurgitation. Tricuspid Valve: The tricuspid valve is normal in structure. Tricuspid valve regurgitation is trivial. Aortic Valve: The aortic valve is normal in structure. Aortic valve regurgitation is trivial. Aortic valve mean gradient measures 5.5 mmHg. Aortic valve peak gradient measures 9.6 mmHg. Aortic valve area, by VTI measures 1.66 cm. Pulmonic Valve: The pulmonic valve was normal in structure. Pulmonic valve regurgitation is not visualized. Aorta: The aortic root and ascending aorta are structurally normal, with no evidence of dilitation. IAS/Shunts: Evidence of atrial level shunting detected by color flow Doppler. Agitated saline contrast was given intravenously to evaluate for intracardiac shunting. Agitated saline contrast bubble study was positive with shunting observed within 3-6 cardiac cycles suggestive of interatrial shunt. A small patent foramen ovale is detected with bidirectional shunting across atrial septum.  LEFT VENTRICLE PLAX 2D LVIDd:         3.20 cm  Diastology LVIDs:         2.32 cm  LV e' lateral:   10.30 cm/s LV PW:         0.96 cm  LV E/e' lateral: 6.4 LV IVS:        0.66 cm  LV e' medial:    6.31 cm/s LVOT diam:     2.00 cm  LV E/e' medial:  10.5 LV SV:         42 LV SV Index:   25 LVOT Area:     3.14 cm  RIGHT VENTRICLE RV Basal diam:  2.60 cm RV S prime:     19.10 cm/s TAPSE (M-mode): 3.0 cm LEFT ATRIUM             Index       RIGHT ATRIUM          Index LA diam:  2.10 cm 1.26 cm/m  RA Area:     7.57 cm LA Vol (A2C):   23.9 ml 14.37 ml/m RA Volume:   12.90 ml 7.75 ml/m LA Vol (A4C):   13.8 ml 8.29 ml/m LA Biplane Vol: 19.2 ml 11.54 ml/m  AORTIC VALVE AV Area (Vmax):    1.69 cm AV Area (Vmean):   1.75 cm AV Area (VTI):     1.66 cm AV Vmax:           155.00 cm/s AV Vmean:           106.500 cm/s AV VTI:            0.254 m AV Peak Grad:      9.6 mmHg AV Mean Grad:      5.5 mmHg LVOT Vmax:         83.60 cm/s LVOT Vmean:        59.400 cm/s LVOT VTI:          0.134 m LVOT/AV VTI ratio: 0.53  AORTA Ao Root diam: 2.50 cm MITRAL VALVE               TRICUSPID VALVE MV Area (PHT): 3.34 cm    TR Peak grad:   14.0 mmHg MV Decel Time: 227 msec    TR Vmax:        187.00 cm/s MV E velocity: 66.00 cm/s MV A velocity: 79.30 cm/s  SHUNTS MV E/A ratio:  0.83        Systemic VTI:  0.13 m                            Systemic Diam: 2.00 cm Arnoldo Hooker MD Electronically signed by Arnoldo Hooker MD Signature Date/Time: 05/30/2020/12:54:15 PM    Final    CT HEAD CODE STROKE WO CONTRAST  Result Date: 05/28/2020 CLINICAL DATA:  Code stroke.  Right vision loss EXAM: CT HEAD WITHOUT CONTRAST TECHNIQUE: Contiguous axial images were obtained from the base of the skull through the vertex without intravenous contrast. COMPARISON:  None. FINDINGS: Brain: No acute intracranial hemorrhage, mass effect, or edema. Gray-white differentiation is preserved. There is no extra-axial fluid collection. Ventricles and sulci are normal in size and configuration. Vascular: No hyperdense vessel. Minor intracranial atherosclerotic calcification at the skull base. Skull: Unremarkable. Sinuses/Orbits: Visualized orbits are unremarkable. Trace paranasal sinus mucosal thickening. Other: Mastoid air cells are clear. ASPECTS ([place]berta Stroke Program Early CT Score) - Ganglionic level infarction (caudate, lentiform nuclei, internal capsule, insula, M1-M3 cortex): 7 - Supraganglionic infarction (M4-M6 cortex): 3 Total score (0-10 with 10 being normal): 10 IMPRESSION: No acute intracranial hemorrhage or evidence of acute infarction. ASPECT score is 10. These results were called by telephone at the time of interpretation on 05/28/2020 at 4:24 pm to provider Shaune Pollack , who verbally acknowledged these results. Electronically Signed   By:  Guadlupe Spanish M.D.   On: 05/28/2020 16:24   CT ANGIO HEAD CODE STROKE  Result Date: 05/28/2020 CLINICAL DATA:  Code stroke follow-up, right vision loss EXAM: CT ANGIOGRAPHY HEAD AND NECK TECHNIQUE: Multidetector CT imaging of the head and neck was performed using the standard protocol during bolus administration of intravenous contrast. Multiplanar CT image reconstructions and MIPs were obtained to evaluate the vascular anatomy. Carotid stenosis measurements (when applicable) are obtained utilizing NASCET criteria, using the distal internal carotid diameter as the denominator. CONTRAST:  15mL OMNIPAQUE IOHEXOL 350 MG/ML SOLN COMPARISON:  None. FINDINGS: CTA  NECK Aortic arch: Great vessel origins are patent. Right carotid system: Patent. Mild calcified plaque at the ICA origin causing minimal stenosis. Left carotid system: Patent. Mild calcified plaque at the ICA origin causing minimal stenosis. Vertebral arteries: Patent and codominant. Skeleton: Degenerative changes of the cervical spine. Other neck: No mass or adenopathy. Upper chest: No apical lung mass. Review of the MIP images confirms the above findings CTA HEAD Anterior circulation: Intracranial internal carotid arteries are patent with mild calcified plaque. Anterior and middle cerebral arteries are patent. Posterior circulation: Intracranial vertebral arteries, basilar artery, and posterior cerebral arteries are patent. Moderate to marked short segment stenosis of the proximal left P2 PCA. There is a right posterior communicating artery present. Venous sinuses: Patent as allowed by contrast bolus timing. Review of the MIP images confirms the above findings IMPRESSION: No large vessel occlusion or evidence of dissection. No hemodynamically significant stenosis in the neck. Moderate to marked short segment stenosis of the proximal left P2 PCA. Electronically Signed   By: Guadlupe Spanish M.D.   On: 05/28/2020 18:14   CT ANGIO NECK CODE STROKE  Result  Date: 05/28/2020 CLINICAL DATA:  Code stroke follow-up, right vision loss EXAM: CT ANGIOGRAPHY HEAD AND NECK TECHNIQUE: Multidetector CT imaging of the head and neck was performed using the standard protocol during bolus administration of intravenous contrast. Multiplanar CT image reconstructions and MIPs were obtained to evaluate the vascular anatomy. Carotid stenosis measurements (when applicable) are obtained utilizing NASCET criteria, using the distal internal carotid diameter as the denominator. CONTRAST:  2mL OMNIPAQUE IOHEXOL 350 MG/ML SOLN COMPARISON:  None. FINDINGS: CTA NECK Aortic arch: Great vessel origins are patent. Right carotid system: Patent. Mild calcified plaque at the ICA origin causing minimal stenosis. Left carotid system: Patent. Mild calcified plaque at the ICA origin causing minimal stenosis. Vertebral arteries: Patent and codominant. Skeleton: Degenerative changes of the cervical spine. Other neck: No mass or adenopathy. Upper chest: No apical lung mass. Review of the MIP images confirms the above findings CTA HEAD Anterior circulation: Intracranial internal carotid arteries are patent with mild calcified plaque. Anterior and middle cerebral arteries are patent. Posterior circulation: Intracranial vertebral arteries, basilar artery, and posterior cerebral arteries are patent. Moderate to marked short segment stenosis of the proximal left P2 PCA. There is a right posterior communicating artery present. Venous sinuses: Patent as allowed by contrast bolus timing. Review of the MIP images confirms the above findings IMPRESSION: No large vessel occlusion or evidence of dissection. No hemodynamically significant stenosis in the neck. Moderate to marked short segment stenosis of the proximal left P2 PCA. Electronically Signed   By: Guadlupe Spanish M.D.   On: 05/28/2020 18:14        Scheduled Meds: . aspirin EC  81 mg Oral Daily  . atorvastatin  40 mg Oral Daily  . Chlorhexidine Gluconate  Cloth  6 each Topical Daily  . DULoxetine  60 mg Oral Daily  . pantoprazole  40 mg Oral Daily  . sodium chloride flush  3 mL Intravenous Once  . sodium chloride flush  3 mL Intravenous Q12H  . tiotropium  18 mcg Inhalation Daily   Continuous Infusions: . sodium chloride       LOS: 2 days    Time spent: 25 minutes    Tresa Moore, MD [facility] Pager [phone]  If 7PM-7AM, please contact night-coverage 05/30/2020, 2:06 PM

## 2020-05-31 DIAGNOSIS — E1169 Type 2 diabetes mellitus with other specified complication: Secondary | ICD-10-CM

## 2020-05-31 DIAGNOSIS — K219 Gastro-esophageal reflux disease without esophagitis: Secondary | ICD-10-CM

## 2020-05-31 DIAGNOSIS — E785 Hyperlipidemia, unspecified: Secondary | ICD-10-CM

## 2020-05-31 DIAGNOSIS — Q211 Atrial septal defect: Secondary | ICD-10-CM

## 2020-05-31 DIAGNOSIS — G894 Chronic pain syndrome: Secondary | ICD-10-CM

## 2020-05-31 DIAGNOSIS — J449 Chronic obstructive pulmonary disease, unspecified: Secondary | ICD-10-CM

## 2020-05-31 NOTE — Consult Note (Signed)
[facility] Cardiology Consultation Note  Patient ID: Kerri Steele, MRN: [id number], DOB/AGE: 1961-08-19 59 y.o. Admit date: 05/28/2020   Date of Consult: 05/31/2020 Primary Physician: Erasmo Downer, NP Primary Cardiologist: Daisy Lazar Chief Complaint:  Chief Complaint  Patient presents with  . Code Stroke   Reason for Consult: Stroke  HPI: 59 y.o. female with known chronic obstructive pulmonary disease diabetes hypertension hyperlipidemia sleep apnea who has been on appropriate medication management.  In the past she is also had apparent palpitations but no evidence of atrial fibrillation by monitor.  There was some short runs of supraventricular tachycardia in the past but no primary concerns of atrial fibrillation either then or currently during this hospitalization.  EKG shows normal sinus rhythm otherwise normal EKG and telemetry strips all showed normal sinus rhythm.  The patient had strokelike symptoms in the morning when getting up ready for her day when she had right cerebrovascular accident type symptoms.  She had TPA for which worked well and resolved her issue.  After that an MRI has shown medial occipital lobe infarct.  There has been no apparent primary source of her stroke other than her risk factors and the possibility of this supraventricular tachycardia being atrial fibrillation.  Additionally echocardiogram has shown normal LV systolic function with patent foramen ovale which may be related to her significant issues at this time.  Therefore we have discussed at length further diagnostic testing for further answers to her question and treatment options other than lipid hypertension and atherosclerotic treatment.  Past Medical History:  Diagnosis Date  . Anginal pain (HCC)   . Anxiety   . Asthma   . COPD (chronic obstructive pulmonary disease) (HCC)   . Depression   . Diabetes mellitus without complication (HCC)   . GERD (gastroesophageal reflux disease)   .  Hypertension   . Hypothyroidism   . Pneumonia   . Shortness of breath dyspnea   . Sleep apnea       Surgical History:  Past Surgical History:  Procedure Laterality Date  . ANKLE SURGERY    . BLADDER SURGERY    . CARDIAC CATHETERIZATION Left 08/23/2015   Procedure: Left Heart Cath and Coronary Angiography;  Surgeon: Dalia Heading, MD;  Location: [facility] INVASIVE CV LAB;  Service: Cardiovascular;  Laterality: Left;  . ESOPHAGEAL DILATION    . TUBAL LIGATION    . WRIST SURGERY       Home Meds: Prior to Admission medications   Medication Sig Start Date End Date Taking? Authorizing Provider  DULoxetine (CYMBALTA) 60 MG capsule Take 60 mg by mouth daily.   Yes [provider]  glipiZIDE (GLUCOTROL XL) 10 MG 24 hr tablet Take 10 mg by mouth daily with breakfast.   Yes [provider]  HYDROcodone-acetaminophen (NORCO) 5-325 MG tablet Take 1 tablet by mouth every 6 (six) hours as needed for severe pain. 08/01/17  Yes Merrily Brittle, MD  Ipratropium-Albuterol (COMBIVENT RESPIMAT) 20-100 MCG/ACT AERS respimat Inhale 1 puff into the lungs every 6 (six) hours.   Yes [provider]  magnesium oxide (MAG-OX) 400 MG tablet Take 400 mg by mouth 2 (two) times daily.    Yes [provider]  methocarbamol (ROBAXIN) 750 MG tablet Take 750 mg by mouth 3 (three) times daily.    Yes [provider]  omeprazole (PRILOSEC) 20 MG capsule Take 20 mg by mouth daily.   Yes [provider]  pregabalin (LYRICA) 150 MG capsule Take 150 mg by  mouth 2 (two) times daily. 05/22/20  Yes [provider]  tiotropium (SPIRIVA) 18 MCG inhalation capsule Place 18 mcg into inhaler and inhale with right-sided cerebrovascular accident symptoms and an MRI now showing medial occipital lobe infarct.  She.   Yes [provider]  traMADol (ULTRAM) 50 MG tablet Take 50 mg by mouth every 6 (six) hours as needed for moderate pain.   Yes [provider]   verapamil (CALAN-SR) 120 MG CR tablet Take 120 mg by mouth daily. 02/06/17  Yes [provider]  arformoterol (BROVANA) 15 MCG/2ML NEBU Take 15 mcg by nebulization 2 (two) times daily.    [provider]  atorvastatin (LIPITOR) 20 MG tablet Take 20 mg by mouth daily. Patient not taking: Reported on 05/28/2020    [provider]  sucralfate (CARAFATE) 1 g tablet Take 1 tablet (1 g total) by mouth 4 (four) times daily. 02/16/17 02/16/18  Jene Every, MD    Inpatient Medications:  . aspirin EC  81 mg Oral Daily  . atorvastatin  40 mg Oral Daily  . Chlorhexidine Gluconate Cloth  6 each Topical Daily  . DULoxetine  60 mg Oral Daily  . pantoprazole  40 mg Oral Daily  . sodium chloride flush  3 mL Intravenous Once  . sodium chloride flush  3 mL Intravenous Q12H  . tiotropium  18 mcg Inhalation Daily   . sodium chloride      Allergies:  Allergies  Allergen Reactions  . Metformin And Related Other (See Comments)    migraine  . Morphine And Related Hives  . Other Other (See Comments)    Latex allergy  . Sulfa Antibiotics Other (See Comments)    Social History   Socioeconomic History  . Marital status: Married    Spouse name: Not on file  . Number of children: Not on file  . Years of education: Not on file  . Highest education level: Not on file  Occupational History  . Not on file  Tobacco Use  . Smoking status: Former Games developer  . Smokeless tobacco: Never Used  Substance and Sexual Activity  . Alcohol use: No  . Drug use: No  . Sexual activity: Not on file  Other Topics Concern  . Not on file  Social History Narrative  . Not on file   Social Determinants of Health   Financial Resource Strain:   . Difficulty of Paying Living Expenses:   Food Insecurity:   . Worried About Programme researcher, broadcasting/film/video in the Last Year:   . Barista in the Last Year:   Transportation Needs:   . Freight forwarder (Medical):   Marland Kitchen Lack of Transportation  (Non-Medical):   Physical Activity:   . Days of Exercise per Week:   . Minutes of Exercise per Session:   Stress:   . Feeling of Stress :   Social Connections:   . Frequency of Communication with Friends and Family:   . Frequency of Social Gatherings with Friends and Family:   . Attends Religious Services:   . Active Member of Clubs or Organizations:   . Attends Banker Meetings:   Marland Kitchen Marital Status:   Intimate Partner Violence:   . Fear of Current or Ex-Partner:   . Emotionally Abused:   Marland Kitchen Physically Abused:   . Sexually Abused:      Family History  Problem Relation Age of Onset  . Heart disease Father   . Hypertension Mother   .  Diabetes Mother   . Lupus Mother   . Atrial fibrillation Mother      Review of Systems Positive for none Negative for: General:  chills, fever, night sweats or weight changes.  Cardiovascular: PND orthopnea syncope dizziness  Dermatological skin lesions rashes Respiratory: Cough congestion Urologic: Frequent urination urination at night and hematuria Abdominal: negative for nausea, vomiting, diarrhea, bright red blood per rectum, melena, or hematemesis Neurologic: negative for visual changes, and/or hearing changes  All other systems reviewed and are otherwise negative except as noted above.  Labs: No results for input(s): CKTOTAL, CKMB, TROPONINI in the last 72 hours. Lab Results  Component Value Date   WBC 10.0 05/30/2020   HGB 12.7 05/30/2020   HCT 41.2 05/30/2020   MCV 78.0 (L) 05/30/2020   PLT 330 05/30/2020    Recent Labs  Lab 05/30/20 0602  NA 144  K 3.5  CL 107  CO2 22  BUN 17  CREATININE 0.80  CALCIUM 9.7  PROT 7.4  BILITOT 0.7  ALKPHOS 80  ALT 22  AST 23  GLUCOSE 111*   Lab Results  Component Value Date   CHOL 155 05/30/2020   HDL 50 05/30/2020   LDLCALC 86 05/30/2020   TRIG 97 05/30/2020   No results found for: DDIMER  Radiology/Studies:  MR BRAIN WO CONTRAST  Result Date:  05/30/2020 CLINICAL DATA:  Stroke, follow-up. EXAM: MRI HEAD WITHOUT CONTRAST TECHNIQUE: Multiplanar, multiecho pulse sequences of the brain and surrounding structures were obtained without intravenous contrast. COMPARISON:  05/28/2020 noncontrast head CT and CTA head and neck. FINDINGS: Brain: Focal restricted diffusion involving the medial left occipital lobe with associated T2/FLAIR hyperintense signal (5:27) and mild gyral edema. Additional punctate foci of restricted diffusion are seen inferiorly within the medial left occipital lobe (5:21). No intracranial hemorrhage. No midline shift, ventriculomegaly or extra-axial fluid collection. No mass lesion. Vascular: Intracranial vessels are better evaluated on recent CTA head. Skull and upper cervical spine: Normal marrow signal. Sinuses/Orbits: Normal orbits. Clear paranasal sinuses. Trace right and small left mastoid effusions. Other: None. 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 These results were called by telephone at the time of interpretation on 05/30/2020 at 12:28 pm to provider [facility] LLC[facility]IE REYNOLDS , who verbally acknowledged these results. Electronically Signed   By: Stana Buntinghikanele  Emekauwa M.D.   On: 05/30/2020 12:39   US Venous Img Lower Bilateral (DVT)  Result Date: 05/30/2020 CLINICAL DATA:  Bilateral lower extremity pain for the past 2 days. Evaluate for DVT. EXAM: BILATERAL LOWER EXTREMITY VENOUS DOPPLER ULTRASOUND TECHNIQUE: Gray-scale sonography with graded compression, as well as color Doppler and duplex ultrasound were performed to evaluate the lower extremity deep venous systems from the level of the common femoral vein and including the common femoral, femoral, profunda femoral, popliteal and calf veins including the posterior tibial, peroneal and gastrocnemius veins when visible. The superficial great saphenous vein was also interrogated. Spectral Doppler was utilized to evaluate flow at rest and with distal augmentation  maneuvers in the common femoral, femoral and popliteal veins. COMPARISON:  None. FINDINGS: RIGHT LOWER EXTREMITY Common Femoral Vein: No evidence of thrombus. Normal compressibility, respiratory phasicity and response to augmentation. Saphenofemoral Junction: No evidence of thrombus. Normal compressibility and flow on color Doppler imaging. Profunda Femoral Vein: No evidence of thrombus. Normal compressibility and flow on color Doppler imaging. Femoral Vein: No evidence of thrombus. Normal compressibility, respiratory phasicity and response to augmentation. Popliteal Vein: No evidence of thrombus. Normal compressibility, respiratory phasicity and response to  augmentation. Calf Veins: No evidence of thrombus. Normal compressibility and flow on color Doppler imaging. Superficial Great Saphenous Vein: No evidence of thrombus. Normal compressibility. Venous Reflux:  None. Other Findings:  None. LEFT LOWER EXTREMITY Common Femoral Vein: No evidence of thrombus. Normal compressibility, respiratory phasicity and response to augmentation. Saphenofemoral Junction: No evidence of thrombus. Normal compressibility and flow on color Doppler imaging. Profunda Femoral Vein: No evidence of thrombus. Normal compressibility and flow on color Doppler imaging. Femoral Vein: No evidence of thrombus. Normal compressibility, respiratory phasicity and response to augmentation. Popliteal Vein: No evidence of thrombus. Normal compressibility, respiratory phasicity and response to augmentation. Calf Veins: No evidence of thrombus. Normal compressibility and flow on color Doppler imaging. Superficial Great Saphenous Vein: No evidence of thrombus. Normal compressibility. Venous Reflux:  None. Other Findings:  None. IMPRESSION: No evidence of DVT within either lower extremity. Electronically Signed   By: Simonne Come M.D.   On: 05/30/2020 17:03   DG Chest Port 1 View  Result Date: 05/29/2020 CLINICAL DATA:  Acute CVA. EXAM: PORTABLE CHEST 1  VIEW COMPARISON:  01/08/2007 FINDINGS: The cardiac silhouette, mediastinal and hilar contours are within normal limits. The lungs are clear. No pleural effusion. The bony thorax is intact. IMPRESSION: No acute cardiopulmonary findings. Electronically Signed   By: Rudie Meyer M.D.   On: 05/29/2020 07:37   ECHOCARDIOGRAM COMPLETE BUBBLE STUDY  Result Date: 05/30/2020    ECHOCARDIOGRAM REPORT   Patient Name:   Kerri Steele Date of Exam: 05/30/2020 Medical Rec #:  [id number]   Height:       60.0 in Accession #:    [id number]  Weight:       152.6 lb Date of Birth:  [birth year]  BSA:          1.664 m Patient Age:    58 years    BP:           155/74 mmHg Patient Gender: F           HR:           98 bpm. Exam Location:  [facility] Procedure: 2D Echo, Cardiac Doppler, Color Doppler and Saline Contrast Bubble            Study Indications:     Stroke 434.91  History:         Patient has no prior history of Echocardiogram examinations.                  COPD, Signs/Symptoms:Shortness of Breath and Dyspnea; Risk                  Factors:Hypertension and Diabetes.  Sonographer:     Cristela Blue RDCS (AE) Referring Phys:  [id number] LESLIE REYNOLDS Diagnosing Phys: Arnoldo Hooker MD  Sonographer Comments: Image acquisition challenging due to COPD. IMPRESSIONS  1. Left ventricular ejection fraction, by estimation, is 50 to 55%. The left ventricle has low normal function. The left ventricle has no regional wall motion abnormalities. Left ventricular diastolic parameters were normal.  2. Right ventricular systolic function is normal. The right ventricular size is normal. There is normal pulmonary artery systolic pressure.  3. The mitral valve is normal in structure. Trivial mitral valve regurgitation.  4. The aortic valve is normal in structure. Aortic valve regurgitation is trivial.  5. Evidence of atrial level shunting detected by color flow Doppler. Agitated saline contrast bubble study was positive with shunting observed within 3-6 cardiac  cycles suggestive of interatrial shunt. There is a  small patent foramen ovale with bidirectional shunting across atrial septum. FINDINGS  Left Ventricle: Left ventricular ejection fraction, by estimation, is 50 to 55%. The left ventricle has low normal function. The left ventricle has no regional wall motion abnormalities. The left ventricular internal cavity size was normal in size. There is no left ventricular hypertrophy. Left ventricular diastolic parameters were normal. Right Ventricle: The right ventricular size is normal. No increase in right ventricular wall thickness. Right ventricular systolic function is normal. There is normal pulmonary artery systolic pressure. The tricuspid regurgitant velocity is 1.87 m/s, and  with an assumed right atrial pressure of 10 mmHg, the estimated right ventricular systolic pressure is 24.0 mmHg. Left Atrium: Left atrial size was normal in size. Right Atrium: Right atrial size was normal in size. Pericardium: There is no evidence of pericardial effusion. Mitral Valve: The mitral valve is normal in structure. Trivial mitral valve regurgitation. Tricuspid Valve: The tricuspid valve is normal in structure. Tricuspid valve regurgitation is trivial. Aortic Valve: The aortic valve is normal in structure. Aortic valve regurgitation is trivial. Aortic valve mean gradient measures 5.5 mmHg. Aortic valve peak gradient measures 9.6 mmHg. Aortic valve area, by VTI measures 1.66 cm. Pulmonic Valve: The pulmonic valve was normal in structure. Pulmonic valve regurgitation is not visualized. Aorta: The aortic root and ascending aorta are structurally normal, with no evidence of dilitation. IAS/Shunts: Evidence of atrial level shunting detected by color flow Doppler. Agitated saline contrast was given intravenously to evaluate for intracardiac shunting. Agitated saline contrast bubble study was positive with shunting observed within 3-6 cardiac cycles suggestive of interatrial shunt. A  small patent foramen ovale is detected with bidirectional shunting across atrial septum.  LEFT VENTRICLE PLAX 2D LVIDd:         3.20 cm  Diastology LVIDs:         2.32 cm  LV e' lateral:   10.30 cm/s LV PW:         0.96 cm  LV E/e' lateral: 6.4 LV IVS:        0.66 cm  LV e' medial:    6.31 cm/s LVOT diam:     2.00 cm  LV E/e' medial:  10.5 LV SV:         42 LV SV Index:   25 LVOT Area:     3.14 cm  RIGHT VENTRICLE RV Basal diam:  2.60 cm RV S prime:     19.10 cm/s TAPSE (M-mode): 3.0 cm LEFT ATRIUM             Index       RIGHT ATRIUM          Index LA diam:        2.10 cm 1.26 cm/m  RA Area:     7.57 cm LA Vol (A2C):   23.9 ml 14.37 ml/m RA Volume:   12.90 ml 7.75 ml/m LA Vol (A4C):   13.8 ml 8.29 ml/m LA Biplane Vol: 19.2 ml 11.54 ml/m  AORTIC VALVE AV Area (Vmax):    1.69 cm AV Area (Vmean):   1.75 cm AV Area (VTI):     1.66 cm AV Vmax:           155.00 cm/s AV Vmean:          106.500 cm/s AV VTI:            0.254 m AV Peak Grad:      9.6 mmHg AV Mean Grad:  5.5 mmHg LVOT Vmax:         83.60 cm/s LVOT Vmean:        59.400 cm/s LVOT VTI:          0.134 m LVOT/AV VTI ratio: 0.53  AORTA Ao Root diam: 2.50 cm MITRAL VALVE               TRICUSPID VALVE MV Area (PHT): 3.34 cm    TR Peak grad:   14.0 mmHg MV Decel Time: 227 msec    TR Vmax:        187.00 cm/s MV E velocity: 66.00 cm/s MV A velocity: 79.30 cm/s  SHUNTS MV E/A ratio:  0.83        Systemic VTI:  0.13 m                            Systemic Diam: 2.00 cm Arnoldo Hooker MD Electronically signed by Arnoldo Hooker MD Signature Date/Time: 05/30/2020/12:54:15 PM    Final    CT HEAD CODE STROKE WO CONTRAST  Result Date: 05/28/2020 CLINICAL DATA:  Code stroke.  Right vision loss EXAM: CT HEAD WITHOUT CONTRAST TECHNIQUE: Contiguous axial images were obtained from the base of the skull through the vertex without intravenous contrast. COMPARISON:  None. FINDINGS: Brain: No acute intracranial hemorrhage, mass effect, or edema. Gray-white  differentiation is preserved. There is no extra-axial fluid collection. Ventricles and sulci are normal in size and configuration. Vascular: No hyperdense vessel. Minor intracranial atherosclerotic calcification at the skull base. Skull: Unremarkable. Sinuses/Orbits: Visualized orbits are unremarkable. Trace paranasal sinus mucosal thickening. Other: Mastoid air cells are clear. ASPECTS (Alberta Stroke Program Early CT Score) - Ganglionic level infarction (caudate, lentiform nuclei, internal capsule, insula, M1-M3 cortex): 7 - Supraganglionic infarction (M4-M6 cortex): 3 Total score (0-10 with 10 being normal): 10 IMPRESSION: No acute intracranial hemorrhage or evidence of acute infarction. ASPECT score is 10. These results were called by telephone at the time of interpretation on 05/28/2020 at 4:24 pm to provider Shaune Pollack , who verbally acknowledged these results. Electronically Signed   By: Guadlupe Spanish M.D.   On: 05/28/2020 16:24   CT ANGIO HEAD CODE STROKE  Result Date: 05/28/2020 CLINICAL DATA:  Code stroke follow-up, right vision loss EXAM: CT ANGIOGRAPHY HEAD AND NECK TECHNIQUE: Multidetector CT imaging of the head and neck was performed using the standard protocol during bolus administration of intravenous contrast. Multiplanar CT image reconstructions and MIPs were obtained to evaluate the vascular anatomy. Carotid stenosis measurements (when applicable) are obtained utilizing NASCET criteria, using the distal internal carotid diameter as the denominator. CONTRAST:  93mL OMNIPAQUE IOHEXOL 350 MG/ML SOLN COMPARISON:  None. FINDINGS: CTA NECK Aortic arch: Great vessel origins are patent. Right carotid system: Patent. Mild calcified plaque at the ICA origin causing minimal stenosis. Left carotid system: Patent. Mild calcified plaque at the ICA origin causing minimal stenosis. Vertebral arteries: Patent and codominant. Skeleton: Degenerative changes of the cervical spine. Other neck: No mass or  adenopathy. Upper chest: No apical lung mass. Review of the MIP images confirms the above findings CTA HEAD Anterior circulation: Intracranial internal carotid arteries are patent with mild calcified plaque. Anterior and middle cerebral arteries are patent. Posterior circulation: Intracranial vertebral arteries, basilar artery, and posterior cerebral arteries are patent. Moderate to marked short segment stenosis of the proximal left P2 PCA. There is a right posterior communicating artery present. Venous sinuses: Patent as allowed by contrast bolus timing. Review  of the MIP images confirms the above findings IMPRESSION: No large vessel occlusion or evidence of dissection. No hemodynamically significant stenosis in the neck. Moderate to marked short segment stenosis of the proximal left P2 PCA. Electronically Signed   By: Guadlupe Spanish M.D.   On: 05/28/2020 18:14   CT ANGIO NECK CODE STROKE  Result Date: 05/28/2020 CLINICAL DATA:  Code stroke follow-up, right vision loss EXAM: CT ANGIOGRAPHY HEAD AND NECK TECHNIQUE: Multidetector CT imaging of the head and neck was performed using the standard protocol during bolus administration of intravenous contrast. Multiplanar CT image reconstructions and MIPs were obtained to evaluate the vascular anatomy. Carotid stenosis measurements (when applicable) are obtained utilizing NASCET criteria, using the distal internal carotid diameter as the denominator. CONTRAST:  75mL OMNIPAQUE IOHEXOL 350 MG/ML SOLN COMPARISON:  None. FINDINGS: CTA NECK Aortic arch: Great vessel origins are patent. Right carotid system: Patent. Mild calcified plaque at the ICA origin causing minimal stenosis. Left carotid system: Patent. Mild calcified plaque at the ICA origin causing minimal stenosis. Vertebral arteries: Patent and codominant. Skeleton: Degenerative changes of the cervical spine. Other neck: No mass or adenopathy. Upper chest: No apical lung mass. Review of the MIP images confirms the  above findings CTA HEAD Anterior circulation: Intracranial internal carotid arteries are patent with mild calcified plaque. Anterior and middle cerebral arteries are patent. Posterior circulation: Intracranial vertebral arteries, basilar artery, and posterior cerebral arteries are patent. Moderate to marked short segment stenosis of the proximal left P2 PCA. There is a right posterior communicating artery present. Venous sinuses: Patent as allowed by contrast bolus timing. Review of the MIP images confirms the above findings IMPRESSION: No large vessel occlusion or evidence of dissection. No hemodynamically significant stenosis in the neck. Moderate to marked short segment stenosis of the proximal left P2 PCA. Electronically Signed   By: Guadlupe Spanish M.D.   On: 05/28/2020 18:14    EKG: Normal sinus rhythm otherwise normal EKG  Weights: Filed Weights   05/29/20 0500 05/30/20 0500 05/31/20 0500  Weight: 69.1 kg 69.2 kg 69.1 kg     Physical Exam: Blood pressure (!) 126/114, pulse 88, temperature 98.6 F (37 C), temperature source Oral, resp. rate 18, height 5' (1.524 m), weight 69.1 kg, SpO2 99 %. Body mass index is 29.75 kg/m. General: Well developed, well nourished, in no acute distress. Head eyes ears nose throat: Normocephalic, atraumatic, sclera non-icteric, no xanthomas, nares are without discharge. No apparent thyromegaly and/or mass  Lungs: Normal respiratory effort.  no wheezes, no rales, no rhonchi.  Heart: RRR with normal S1 S2. no murmur gallop, no rub, PMI is normal size and placement, carotid upstroke normal without bruit, jugular venous pressure is normal Abdomen: Soft, non-tender, non-distended with normoactive bowel sounds. No hepatomegaly. No rebound/guarding. No obvious abdominal masses. Abdominal aorta is normal size without bruit Extremities: No edema. no cyanosis, no clubbing, no ulcers  Peripheral : 2+ bilateral upper extremity pulses, 2+ bilateral femoral pulses, 2+  bilateral dorsal pedal pulse Neuro: Alert and oriented. No facial asymmetry. No focal deficit. Moves all extremities spontaneously. Musculoskeletal: Normal muscle tone without kyphosis Psych:  Responds to questions appropriately with a normal affect.    Assessment: 59 year old female with diabetes hypertension hyperlipidemia sleep apnea with medial occipital lobe stroke now improving significantly with no apparent source of issue needing further treatment options  Plan: 1.  Continue supportive care and rehabilitation from stroke 2.  Aspirin for further risk reduction of recurrent stroke 3.  High intensity cholesterol  therapy 4.  Further treatment of hypertension as necessary 5.  Further cryptogenic stroke evaluation including transesophageal echocardiogram for significance of patent foramen ovale atrial septal defect and other sources of stroke as well as the possibility of Linq device for further evaluation of supraventricular tachycardia atrial fibrillation as a primary source.  Patient understands risk and benefits of these procedures including the possibility of stroke esophageal perforation side effects of medication management sore throat bleeding bruising and infection.  She is at low risk for conscious sedation  Signed, Lamar Blinks M.D. [facility] 05/31/2020, 5:57 PM

## 2020-05-31 NOTE — Progress Notes (Signed)
Pt transferred to RM #[id number] at this time. VSS prior to transfer. Report given to [facility], Charity fundraiser.

## 2020-05-31 NOTE — Progress Notes (Signed)
OT Cancellation Note  Patient Details Name: [name] MRN: [id number] DOB: 1961-02-25   Cancelled Treatment:    Reason Eval/Treat Not Completed: OT screened, no needs identified, will sign off. Order received, chart reviewed. Pt sitting up in bed with husband present on arrival, reporting all symptoms resolved, with only occasional right eye blurriness. Performed neuro screen and muscle testing, pt with performing equally bilaterally. Pt is A&O x4 and reports she is at functional baseline. Husband is available for 24/7 support. No OT needs identified at this time. Please re-consult if additional concerns arise. Thanks!   Maurilio Lovely, OTS 05/31/20, 2:44 PM

## 2020-05-31 NOTE — Progress Notes (Signed)
PROGRESS NOTE    Kerri Steele  HQI:[id number] DOB: 10/22/1961 DOA: 05/28/2020 PCP: Erasmo Downer, NP    Brief Narrative:  Kerri Steele 59 y.o.femalewith past medical history remarkable for essential HTN, HLD, COPD, DMpresented who presented to the ED with stroke symptoms, last known normal at 2pm that day. Symptoms included decreased vision, numbness of the right side of the tongue with facial droop, and right-sided upper and lower extremity weakness. Symptoms persisted. In the ED, was evaluated by teleneurology. Ultimately was treated with tPA and subsequently admitted to ICU for monitoring. Patient has remained stable.  TRH consulted for further evaluation and treatment.   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  Teleneurology was consulted and patient received TPA.  T head with no acute intracranial hemorrhage or evidence of acute infarction.  MR brain without contrast notable for acute infarct involving the medial left occipital lobe.  CT angio head with no large vessel occlusion or evidence of dissection.  Right lower extremity ultrasound negative for DVT.  TTE with EF 50-55%, no regional wall motion abnormalities, but does note positive interatrial shunt with small patent foramen ovale.  Seen by PT/OT and speech therapy with no further recommendations and her symptoms have resolved and she is back at her normal baseline. --Neurology following, appreciate assistance --Continue aspirin 81 mg p.o. daily, atorvastatin 40 mg p.o. daily --Continue to monitor on telemetry.  Patent foramen ovale TTE notable for interatrial shunt with small patent foramen ovale.  Patient follows with Dr. Lady [facility] cardiology outpatient. --Cardiology consulted for evaluation and recommendations.  Essential hypertension On  verapamil 120 mg p.o. daily at home. --Hold home verapamil for borderline hypotension this morning --Continue aspirin and statin --To monitor blood pressure closely  Type 2 diabetes mellitus Hemoglobin A1c 5.9, well controlled.  On glipizide 10 mg p.o. daily at home. --Hold oral hypoglycemics while inpatient --Insulin sliding scale for coverage --CBGs before every meal/at bedtime  COPD, not in acute exacerbation --Continue Spiriva and Brovana --Duo nebs as needed  Hyperlipidemia Total cholesterol 155, HDL 50, LDL 86. --Continue atorvastatin 40 mg p.o. daily  Chronic pain syndrome: Norco q6h prn  GERD: Continue PPI   DVT prophylaxis: SCDs Code Status: Full code Family Communication: Updated patient's husband who is present at bedside  Disposition Plan:  Status is: Inpatient  Remains inpatient appropriate because:Ongoing diagnostic testing needed not appropriate for outpatient work up, Unsafe d/c plan and Inpatient level of care appropriate due to severity of illness   Dispo: The patient is from: Home              Anticipated d/c is to: Home              Anticipated d/c date is: 1 day              Patient currently is not medically stable to d/c.        Consultants:   Neurology, Dr. Thad Ranger  Cardiology, Dr. Gwen Pounds  Procedures:  Transthoracic echocardiogram 05/30/2020: IMPRESSIONS    1. Left ventricular ejection fraction, by estimation, is 50 to 55%. The  left ventricle has low normal function. The left ventricle has no regional  wall motion abnormalities. Left ventricular diastolic parameters were  normal.  2. Right ventricular systolic function is normal. The right ventricular  size is normal. There is normal  pulmonary artery systolic pressure.  3. The mitral valve is normal in structure. Trivial mitral valve  regurgitation.  4. The aortic valve is normal in structure. Aortic valve regurgitation is  trivial.  5. Evidence of atrial level shunting  detected by color flow Doppler.  Agitated saline contrast bubble study was positive with shunting observed  within 3-6 cardiac cycles suggestive of interatrial shunt. There is a  small patent foramen ovale with  bidirectional shunting across atrial septum.   Ultrasound venous duplex bilateral lower extremities: IMPRESSION: No evidence of DVT within either lower extremity.  Antimicrobials:   None   Subjective: Patient seen and examined at bedside, resting comfortably.  Symptoms have resolved.  Husband updated at bedside.  Awaiting cardiology evaluation for patent foramen ovale.  No other questions or concerns at this time.  Denies headache, no fever/chills/night sweats, no nausea/vomiting/diarrhea, no chest pain, no palpitations, no shortness of breath, no abdominal pain.  No acute events overnight per nursing staff.  Objective: Vitals:   05/31/20 1200 05/31/20 1300 05/31/20 1400 05/31/20 1500  BP: 137/64     Pulse: 74 79 88   Resp: 16 20 (!) 26 15  Temp:      TempSrc:      SpO2:  97% 99%   Weight:      Height:        Intake/Output Summary (Last 24 hours) at 05/31/2020 1554 Last data filed at 05/31/2020 0828 Gross per 24 hour  Intake 0 ml  Output 0 ml  Net 0 ml   Filed Weights   05/29/20 0500 05/30/20 0500 05/31/20 0500  Weight: 69.1 kg 69.2 kg 69.1 kg    Examination:  General exam: Appears calm and comfortable  Respiratory system: Clear to auscultation. Respiratory effort normal. Cardiovascular system: S1 & S2 heard, RRR. No JVD, murmurs, rubs, gallops or clicks. No pedal edema. Gastrointestinal system: Abdomen is nondistended, soft and nontender. No organomegaly or masses felt. Normal bowel sounds heard. Central nervous system: Alert and oriented. No focal neurological deficits. Extremities: Symmetric 5 x 5 power. Skin: No rashes, lesions or ulcers Psychiatry: Judgement and insight appear normal. Mood & affect appropriate.     Data Reviewed: I have personally  reviewed following labs and imaging studies  CBC: Recent Labs  Lab 05/28/20 1625 05/29/20 0439 05/30/20 0602  WBC 11.1* 7.7 10.0  NEUTROABS 6.4  --   --   HGB 12.7 11.6* 12.7  HCT 41.1 38.1 41.2  MCV 79.0* 79.2* 78.0*  PLT 347 299 330   Basic Metabolic Panel: Recent Labs  Lab 05/28/20 1625 05/29/20 0439 05/30/20 0602  NA 140 143 144  K 3.4* 3.1* 3.5  CL 104 107 107  CO2 27 30 22   GLUCOSE 108* 79 111*  BUN 18 14 17   CREATININE 1.01* 0.99 0.80  CALCIUM 9.6 9.0 9.7  MG  --  2.3 2.4  PHOS  --  3.9 3.2   GFR: Estimated Creatinine Clearance: 67.2 mL/min (by C-G formula based on SCr of 0.8 mg/dL). Liver Function Tests: Recent Labs  Lab 05/28/20 1625 05/30/20 0602  AST 30 23  ALT 27 22  ALKPHOS 94 80  BILITOT 0.6 0.7  PROT 7.4 7.4  ALBUMIN 4.3 4.0   No results for input(s): LIPASE, AMYLASE in the last 168 hours. No results for input(s): AMMONIA in the last 168 hours. Coagulation Profile: Recent Labs  Lab 05/28/20 1625  INR 0.9   Cardiac Enzymes: No results for input(s): CKTOTAL, CKMB, CKMBINDEX, TROPONINI in the  last 168 hours. BNP (last 3 results) No results for input(s): PROBNP in the last 8760 hours. HbA1C: Recent Labs    05/30/20 0602  HGBA1C 5.9*   CBG: Recent Labs  Lab 05/28/20 1620 05/28/20 1954  GLUCAP 97 128*   Lipid Profile: Recent Labs    05/30/20 0602  CHOL 155  HDL 50  LDLCALC 86  TRIG 97  CHOLHDL 3.1   Thyroid Function Tests: No results for input(s): TSH, T4TOTAL, FREET4, T3FREE, THYROIDAB in the last 72 hours. Anemia Panel: No results for input(s): VITAMINB12, FOLATE, FERRITIN, TIBC, IRON, RETICCTPCT in the last 72 hours. Sepsis Labs: No results for input(s): PROCALCITON, LATICACIDVEN in the last 168 hours.  Recent Results (from the past 240 hour(s))  SARS Coronavirus 2 by RT PCR (hospital order, performed in [facility] hospital lab) Nasopharyngeal Nasopharyngeal Swab     Status: None   Collection Time: 05/28/20  6:15  PM   Specimen: Nasopharyngeal Swab  Result Value Ref Range Status   SARS Coronavirus 2 NEGATIVE NEGATIVE Final    Comment: (NOTE) SARS-CoV-2 target nucleic acids are NOT DETECTED.  The SARS-CoV-2 RNA is generally detectable in upper and lower respiratory specimens during the acute phase of infection. The lowest concentration of SARS-CoV-2 viral copies this assay can detect is 250 copies / mL. A negative result does not preclude SARS-CoV-2 infection and should not be used as the sole basis for treatment or other patient management decisions.  A negative result may occur with improper specimen collection / handling, submission of specimen other than nasopharyngeal swab, presence of viral mutation(s) within the areas targeted by this assay, and inadequate number of viral copies (<250 copies / mL). A negative result must be combined with clinical observations, patient history, and epidemiological information.  Fact Sheet for Patients:   BoilerBrush.com.cy  Fact Sheet for Healthcare Providers: https://pope.com/  This test is not yet approved or  cleared by the [place] FDA and has been authorized for detection and/or diagnosis of SARS-CoV-2 by FDA under an Emergency Use Authorization (EUA).  This EUA will remain in effect (meaning this test can be used) for the duration of the COVID-19 declaration under Section 564(b)(1) of the Act, 21 U.S.C. section 360bbb-3(b)(1), unless the authorization is terminated or revoked sooner.  Performed at [facility], [address] [place], [place] [address]   MRSA PCR Screening     Status: None   Collection Time: 05/28/20  7:55 PM   Specimen: Nasal Mucosa; Nasopharyngeal  Result Value Ref Range Status   MRSA by PCR NEGATIVE NEGATIVE Final    Comment:        The GeneXpert MRSA Assay (FDA approved for NASAL specimens only), is one component of a comprehensive MRSA  colonization surveillance program. It is not intended to diagnose MRSA infection nor to guide or monitor treatment for MRSA infections. Performed at [facility], [address] [place], [place] [address]          Radiology Studies: MR BRAIN WO CONTRAST  Result Date: 05/30/2020 CLINICAL DATA:  Stroke, follow-up. EXAM: MRI HEAD WITHOUT CONTRAST TECHNIQUE: Multiplanar, multiecho pulse sequences of the brain and surrounding structures were obtained without intravenous contrast. COMPARISON:  05/28/2020 noncontrast head CT and CTA head and neck. FINDINGS: Brain: Focal restricted diffusion involving the medial left occipital lobe with associated T2/FLAIR hyperintense signal (5:27) and mild gyral edema. Additional punctate foci of restricted diffusion are seen inferiorly within the medial left occipital lobe (5:21). No intracranial hemorrhage. No midline  shift, ventriculomegaly or extra-axial fluid collection. No mass lesion. Vascular: Intracranial vessels are better evaluated on recent CTA head. Skull and upper cervical spine: Normal marrow signal. Sinuses/Orbits: Normal orbits. Clear paranasal sinuses. Trace right and small left mastoid effusions. Other: None. 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 These results were called by telephone at the time of interpretation on 05/30/2020 at 12:28 pm to provider [facility] , who verbally acknowledged these results. Electronically Signed   By: Stana Bunting M.D.   On: 05/30/2020 12:39   US Venous Img Lower Bilateral (DVT)  Result Date: 05/30/2020 CLINICAL DATA:  Bilateral lower extremity pain for the past 2 days. Evaluate for DVT. EXAM: BILATERAL LOWER EXTREMITY VENOUS DOPPLER ULTRASOUND TECHNIQUE: Gray-scale sonography with graded compression, as well as color Doppler and duplex ultrasound were performed to evaluate the lower extremity deep venous systems from the level of the common femoral vein and  including the common femoral, femoral, profunda femoral, popliteal and calf veins including the posterior tibial, peroneal and gastrocnemius veins when visible. The superficial great saphenous vein was also interrogated. Spectral Doppler was utilized to evaluate flow at rest and with distal augmentation maneuvers in the common femoral, femoral and popliteal veins. COMPARISON:  None. FINDINGS: RIGHT LOWER EXTREMITY Common Femoral Vein: No evidence of thrombus. Normal compressibility, respiratory phasicity and response to augmentation. Saphenofemoral Junction: No evidence of thrombus. Normal compressibility and flow on color Doppler imaging. Profunda Femoral Vein: No evidence of thrombus. Normal compressibility and flow on color Doppler imaging. Femoral Vein: No evidence of thrombus. Normal compressibility, respiratory phasicity and response to augmentation. Popliteal Vein: No evidence of thrombus. Normal compressibility, respiratory phasicity and response to augmentation. Calf Veins: No evidence of thrombus. Normal compressibility and flow on color Doppler imaging. Superficial Great Saphenous Vein: No evidence of thrombus. Normal compressibility. Venous Reflux:  None. Other Findings:  None. LEFT LOWER EXTREMITY Common Femoral Vein: No evidence of thrombus. Normal compressibility, respiratory phasicity and response to augmentation. Saphenofemoral Junction: No evidence of thrombus. Normal compressibility and flow on color Doppler imaging. Profunda Femoral Vein: No evidence of thrombus. Normal compressibility and flow on color Doppler imaging. Femoral Vein: No evidence of thrombus. Normal compressibility, respiratory phasicity and response to augmentation. Popliteal Vein: No evidence of thrombus. Normal compressibility, respiratory phasicity and response to augmentation. Calf Veins: No evidence of thrombus. Normal compressibility and flow on color Doppler imaging. Superficial Great Saphenous Vein: No evidence of  thrombus. Normal compressibility. Venous Reflux:  None. Other Findings:  None. IMPRESSION: No evidence of DVT within either lower extremity. Electronically Signed   By: Simonne Come M.D.   On: 05/30/2020 17:03   ECHOCARDIOGRAM COMPLETE BUBBLE STUDY  Result Date: 05/30/2020    ECHOCARDIOGRAM REPORT   Patient Name:   ZAYLIA RIOLO Date of Exam: 05/30/2020 Medical Rec #:  [id number]   Height:       60.0 in Accession #:    [id number]  Weight:       152.6 lb Date of Birth:  08/17/1961  BSA:          1.664 m Patient Age:    58 years    BP:           155/74 mmHg Patient Gender: F           HR:           98 bpm. Exam Location:  [facility] Procedure: 2D Echo, Cardiac Doppler, Color Doppler and Saline Contrast Bubble  Study Indications:     Stroke 434.91  History:         Patient has no prior history of Echocardiogram examinations.                  COPD, Signs/Symptoms:Shortness of Breath and Dyspnea; Risk                  Factors:Hypertension and Diabetes.  Sonographer:     Cristela Blue RDCS (AE) Referring Phys:  [id number] LESLIE REYNOLDS Diagnosing Phys: Arnoldo Hooker MD  Sonographer Comments: Image acquisition challenging due to COPD. IMPRESSIONS  1. Left ventricular ejection fraction, by estimation, is 50 to 55%. The left ventricle has low normal function. The left ventricle has no regional wall motion abnormalities. Left ventricular diastolic parameters were normal.  2. Right ventricular systolic function is normal. The right ventricular size is normal. There is normal pulmonary artery systolic pressure.  3. The mitral valve is normal in structure. Trivial mitral valve regurgitation.  4. The aortic valve is normal in structure. Aortic valve regurgitation is trivial.  5. Evidence of atrial level shunting detected by color flow Doppler. Agitated saline contrast bubble study was positive with shunting observed within 3-6 cardiac cycles suggestive of interatrial shunt. There is a small patent foramen ovale with bidirectional  shunting across atrial septum. FINDINGS  Left Ventricle: Left ventricular ejection fraction, by estimation, is 50 to 55%. The left ventricle has low normal function. The left ventricle has no regional wall motion abnormalities. The left ventricular internal cavity size was normal in size. There is no left ventricular hypertrophy. Left ventricular diastolic parameters were normal. Right Ventricle: The right ventricular size is normal. No increase in right ventricular wall thickness. Right ventricular systolic function is normal. There is normal pulmonary artery systolic pressure. The tricuspid regurgitant velocity is 1.87 m/s, and  with an assumed right atrial pressure of 10 mmHg, the estimated right ventricular systolic pressure is 24.0 mmHg. Left Atrium: Left atrial size was normal in size. Right Atrium: Right atrial size was normal in size. Pericardium: There is no evidence of pericardial effusion. Mitral Valve: The mitral valve is normal in structure. Trivial mitral valve regurgitation. Tricuspid Valve: The tricuspid valve is normal in structure. Tricuspid valve regurgitation is trivial. Aortic Valve: The aortic valve is normal in structure. Aortic valve regurgitation is trivial. Aortic valve mean gradient measures 5.5 mmHg. Aortic valve peak gradient measures 9.6 mmHg. Aortic valve area, by VTI measures 1.66 cm. Pulmonic Valve: The pulmonic valve was normal in structure. Pulmonic valve regurgitation is not visualized. Aorta: The aortic root and ascending aorta are structurally normal, with no evidence of dilitation. IAS/Shunts: Evidence of atrial level shunting detected by color flow Doppler. Agitated saline contrast was given intravenously to evaluate for intracardiac shunting. Agitated saline contrast bubble study was positive with shunting observed within 3-6 cardiac cycles suggestive of interatrial shunt. A small patent foramen ovale is detected with bidirectional shunting across atrial septum.  LEFT  VENTRICLE PLAX 2D LVIDd:         3.20 cm  Diastology LVIDs:         2.32 cm  LV e' lateral:   10.30 cm/s LV PW:         0.96 cm  LV E/e' lateral: 6.4 LV IVS:        0.66 cm  LV e' medial:    6.31 cm/s LVOT diam:     2.00 cm  LV E/e' medial:  10.5 LV SV:  42 LV SV Index:   25 LVOT Area:     3.14 cm  RIGHT VENTRICLE RV Basal diam:  2.60 cm RV S prime:     19.10 cm/s TAPSE (M-mode): 3.0 cm LEFT ATRIUM             Index       RIGHT ATRIUM          Index LA diam:        2.10 cm 1.26 cm/m  RA Area:     7.57 cm LA Vol (A2C):   23.9 ml 14.37 ml/m RA Volume:   12.90 ml 7.75 ml/m LA Vol (A4C):   13.8 ml 8.29 ml/m LA Biplane Vol: 19.2 ml 11.54 ml/m  AORTIC VALVE AV Area (Vmax):    1.69 cm AV Area (Vmean):   1.75 cm AV Area (VTI):     1.66 cm AV Vmax:           155.00 cm/s AV Vmean:          106.500 cm/s AV VTI:            0.254 m AV Peak Grad:      9.6 mmHg AV Mean Grad:      5.5 mmHg LVOT Vmax:         83.60 cm/s LVOT Vmean:        59.400 cm/s LVOT VTI:          0.134 m LVOT/AV VTI ratio: 0.53  AORTA Ao Root diam: 2.50 cm MITRAL VALVE               TRICUSPID VALVE MV Area (PHT): 3.34 cm    TR Peak grad:   14.0 mmHg MV Decel Time: 227 msec    TR Vmax:        187.00 cm/s MV E velocity: 66.00 cm/s MV A velocity: 79.30 cm/s  SHUNTS MV E/A ratio:  0.83        Systemic VTI:  0.13 m                            Systemic Diam: 2.00 cm Arnoldo Hooker MD Electronically signed by Arnoldo Hooker MD Signature Date/Time: 05/30/2020/12:54:15 PM    Final         Scheduled Meds: . aspirin EC  81 mg Oral Daily  . atorvastatin  40 mg Oral Daily  . Chlorhexidine Gluconate Cloth  6 each Topical Daily  . DULoxetine  60 mg Oral Daily  . pantoprazole  40 mg Oral Daily  . sodium chloride flush  3 mL Intravenous Once  . sodium chloride flush  3 mL Intravenous Q12H  . tiotropium  18 mcg Inhalation Daily   Continuous Infusions: . sodium chloride       LOS: 3 days    Time spent: 36 minutes spent on chart review,  discussion with nursing staff, consultants, updating family and interview/physical exam; more than 50% of that time was spent in counseling and/or coordination of care.    Alvira Philips [place], DO [facility] Available via Epic secure chat 7am-7pm After these hours, please refer to coverage provider listed on [email or web address] 05/31/2020, 3:54 PM

## 2020-05-31 NOTE — Progress Notes (Signed)
Subjective: No new neurological complaints  Objective: Current vital signs: BP 137/64   Pulse 74   Temp 98.6 F (37 C) (Oral)   Resp 16   Ht 5' (1.524 m)   Wt 69.1 kg   SpO2 (!) 81%   BMI 29.75 kg/m  Vital signs in last 24 hours: Temp:  [98.1 F (36.7 C)-98.6 F (37 C)] 98.6 F (37 C) (07/21 0800) Pulse Rate:  [70-126] 74 (07/21 1200) Resp:  [12-35] 16 (07/21 1200) BP: (98-144)/(64-95) 137/64 (07/21 1200) SpO2:  [81 %-100 %] 81 % (07/21 1000) Weight:  [69.1 kg] 69.1 kg (07/21 0500)  Intake/Output from previous day: No intake/output data recorded. Intake/Output this shift: No intake/output data recorded. Nutritional status:  Diet Order            DIET DYS 3 Room service appropriate? Yes; Fluid consistency: Thin  Diet effective now                 Neurologic Exam: Mental Status: Alert, oriented, thought content appropriate.  Speech fluent without evidence of aphasia.  Able to follow 3 step commands without difficulty.  Cranial Nerves: II: Visual fields grossly normal, pupils equal, round, reactive to light and accommodation III,IV, VI: ptosis not present, extra-ocular motions intact bilaterally V,VII: smile symmetric, facial light touch sensation normal bilaterally VIII: hearing normal bilaterally IX,X: gag reflex present XI: bilateral shoulder shrug XII: midline tongue extension Motor: Generalized weakness throughout with give-way weakness noted in the upper extremities.  RLE 5-/5, LLE 4/5 Sensory: Pinprick and light touch decreased in the RLE  Lab Results: Basic Metabolic Panel: Recent Labs  Lab 05/28/20 1625 05/29/20 0439 05/30/20 0602  NA 140 143 144  K 3.4* 3.1* 3.5  CL 104 107 107  CO2 27 30 22   GLUCOSE 108* 79 111*  BUN 18 14 17   CREATININE 1.01* 0.99 0.80  CALCIUM 9.6 9.0 9.7  MG  --  2.3 2.4  PHOS  --  3.9 3.2    Liver Function Tests: Recent Labs  Lab 05/28/20 1625 05/30/20 0602  AST 30 23  ALT 27 22  ALKPHOS 94 80  BILITOT 0.6  0.7  PROT 7.4 7.4  ALBUMIN 4.3 4.0   No results for input(s): LIPASE, AMYLASE in the last 168 hours. No results for input(s): AMMONIA in the last 168 hours.  CBC: Recent Labs  Lab 05/28/20 1625 05/29/20 0439 05/30/20 0602  WBC 11.1* 7.7 10.0  NEUTROABS 6.4  --   --   HGB 12.7 11.6* 12.7  HCT 41.1 38.1 41.2  MCV 79.0* 79.2* 78.0*  PLT 347 299 330    Cardiac Enzymes: No results for input(s): CKTOTAL, CKMB, CKMBINDEX, TROPONINI in the last 168 hours.  Lipid Panel: Recent Labs  Lab 05/30/20 0602  CHOL 155  TRIG 97  HDL 50  CHOLHDL 3.1  VLDL 19  LDLCALC 86    CBG: Recent Labs  Lab 05/28/20 1620 05/28/20 1954  GLUCAP 97 128*    Microbiology: Results for orders placed or performed during the hospital encounter of 05/28/20  SARS Coronavirus 2 by RT PCR (hospital order, performed in [facility] hospital lab) Nasopharyngeal Nasopharyngeal Swab     Status: None   Collection Time: 05/28/20  6:15 PM   Specimen: Nasopharyngeal Swab  Result Value Ref Range Status   SARS Coronavirus 2 NEGATIVE NEGATIVE Final    Comment: (NOTE) SARS-CoV-2 target nucleic acids are NOT DETECTED.  The SARS-CoV-2 RNA is generally detectable in upper and lower respiratory specimens  during the acute phase of infection. The lowest concentration of SARS-CoV-2 viral copies this assay can detect is 250 copies / mL. A negative result does not preclude SARS-CoV-2 infection and should not be used as the sole basis for treatment or other patient management decisions.  A negative result may occur with improper specimen collection / handling, submission of specimen other than nasopharyngeal swab, presence of viral mutation(s) within the areas targeted by this assay, and inadequate number of viral copies (<250 copies / mL). A negative result must be combined with clinical observations, patient history, and epidemiological information.  Fact Sheet for Patients:    BoilerBrush.com.cy  Fact Sheet for Healthcare Providers: https://pope.com/  This test is not yet approved or  cleared by the [place] FDA and has been authorized for detection and/or diagnosis of SARS-CoV-2 by FDA under an Emergency Use Authorization (EUA).  This EUA will remain in effect (meaning this test can be used) for the duration of the COVID-19 declaration under Section 564(b)(1) of the Act, 21 U.S.C. section 360bbb-3(b)(1), unless the authorization is terminated or revoked sooner.  Performed at [facility], [address] [place], [place] [address]   MRSA PCR Screening     Status: None   Collection Time: 05/28/20  7:55 PM   Specimen: Nasal Mucosa; Nasopharyngeal  Result Value Ref Range Status   MRSA by PCR NEGATIVE NEGATIVE Final    Comment:        The GeneXpert MRSA Assay (FDA approved for NASAL specimens only), is one component of a comprehensive MRSA colonization surveillance program. It is not intended to diagnose MRSA infection nor to guide or monitor treatment for MRSA infections. Performed at [facility], [address], [place], [place] [address]     Coagulation Studies: Recent Labs    05/28/20 1625  LABPROT 11.6  INR 0.9    Imaging: MR BRAIN WO CONTRAST  Result Date: 05/30/2020 CLINICAL DATA:  Stroke, follow-up. EXAM: MRI HEAD WITHOUT CONTRAST TECHNIQUE: Multiplanar, multiecho pulse sequences of the brain and surrounding structures were obtained without intravenous contrast. COMPARISON:  05/28/2020 noncontrast head CT and CTA head and neck. FINDINGS: Brain: Focal restricted diffusion involving the medial left occipital lobe with associated T2/FLAIR hyperintense signal (5:27) and mild gyral edema. Additional punctate foci of restricted diffusion are seen inferiorly within the medial left occipital lobe (5:21). No intracranial hemorrhage. No midline shift, ventriculomegaly or  extra-axial fluid collection. No mass lesion. Vascular: Intracranial vessels are better evaluated on recent CTA head. Skull and upper cervical spine: Normal marrow signal. Sinuses/Orbits: Normal orbits. Clear paranasal sinuses. Trace right and small left mastoid effusions. Other: None. 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 These results were called by telephone at the time of interpretation on 05/30/2020 at 12:28 pm to provider [facility] , who verbally acknowledged these results. Electronically Signed   By: Stana Bunting M.D.   On: 05/30/2020 12:39   US Venous Img Lower Bilateral (DVT)  Result Date: 05/30/2020 CLINICAL DATA:  Bilateral lower extremity pain for the past 2 days. Evaluate for DVT. EXAM: BILATERAL LOWER EXTREMITY VENOUS DOPPLER ULTRASOUND TECHNIQUE: Gray-scale sonography with graded compression, as well as color Doppler and duplex ultrasound were performed to evaluate the lower extremity deep venous systems from the level of the common femoral vein and including the common femoral, femoral, profunda femoral, popliteal and calf veins including the posterior tibial, peroneal and gastrocnemius veins when visible. The superficial great saphenous vein was also interrogated. Spectral Doppler was  utilized to evaluate flow at rest and with distal augmentation maneuvers in the common femoral, femoral and popliteal veins. COMPARISON:  None. FINDINGS: RIGHT LOWER EXTREMITY Common Femoral Vein: No evidence of thrombus. Normal compressibility, respiratory phasicity and response to augmentation. Saphenofemoral Junction: No evidence of thrombus. Normal compressibility and flow on color Doppler imaging. Profunda Femoral Vein: No evidence of thrombus. Normal compressibility and flow on color Doppler imaging. Femoral Vein: No evidence of thrombus. Normal compressibility, respiratory phasicity and response to augmentation. Popliteal Vein: No evidence of thrombus. Normal  compressibility, respiratory phasicity and response to augmentation. Calf Veins: No evidence of thrombus. Normal compressibility and flow on color Doppler imaging. Superficial Great Saphenous Vein: No evidence of thrombus. Normal compressibility. Venous Reflux:  None. Other Findings:  None. LEFT LOWER EXTREMITY Common Femoral Vein: No evidence of thrombus. Normal compressibility, respiratory phasicity and response to augmentation. Saphenofemoral Junction: No evidence of thrombus. Normal compressibility and flow on color Doppler imaging. Profunda Femoral Vein: No evidence of thrombus. Normal compressibility and flow on color Doppler imaging. Femoral Vein: No evidence of thrombus. Normal compressibility, respiratory phasicity and response to augmentation. Popliteal Vein: No evidence of thrombus. Normal compressibility, respiratory phasicity and response to augmentation. Calf Veins: No evidence of thrombus. Normal compressibility and flow on color Doppler imaging. Superficial Great Saphenous Vein: No evidence of thrombus. Normal compressibility. Venous Reflux:  None. Other Findings:  None. IMPRESSION: No evidence of DVT within either lower extremity. Electronically Signed   By: Simonne ComeJohn  Watts M.D.   On: 05/30/2020 17:03   ECHOCARDIOGRAM COMPLETE BUBBLE STUDY  Result Date: 05/30/2020    ECHOCARDIOGRAM REPORT   Patient Name:   Silver HugueninJANET Bartram Date of Exam: 05/30/2020 Medical Rec #:  [id number]   Height:       60.0 in Accession #:    [id number]  Weight:       152.6 lb Date of Birth:  1961-04-18  BSA:          1.664 m Patient Age:    58 years    BP:           155/74 mmHg Patient Gender: F           HR:           98 bpm. Exam Location:  [facility] Procedure: 2D Echo, Cardiac Doppler, Color Doppler and Saline Contrast Bubble            Study Indications:     Stroke 434.91  History:         Patient has no prior history of Echocardiogram examinations.                  COPD, Signs/Symptoms:Shortness of Breath and Dyspnea; Risk                   Factors:Hypertension and Diabetes.  Sonographer:     Cristela BlueJerry Hege RDCS (AE) Referring Phys:  [id number] Kalil Woessner Diagnosing Phys: Arnoldo HookerBruce Kowalski MD  Sonographer Comments: Image acquisition challenging due to COPD. IMPRESSIONS  1. Left ventricular ejection fraction, by estimation, is 50 to 55%. The left ventricle has low normal function. The left ventricle has no regional wall motion abnormalities. Left ventricular diastolic parameters were normal.  2. Right ventricular systolic function is normal. The right ventricular size is normal. There is normal pulmonary artery systolic pressure.  3. The mitral valve is normal in structure. Trivial mitral valve regurgitation.  4. The aortic valve is normal in structure. Aortic valve regurgitation is trivial.  5. Evidence  of atrial level shunting detected by color flow Doppler. Agitated saline contrast bubble study was positive with shunting observed within 3-6 cardiac cycles suggestive of interatrial shunt. There is a small patent foramen ovale with bidirectional shunting across atrial septum. FINDINGS  Left Ventricle: Left ventricular ejection fraction, by estimation, is 50 to 55%. The left ventricle has low normal function. The left ventricle has no regional wall motion abnormalities. The left ventricular internal cavity size was normal in size. There is no left ventricular hypertrophy. Left ventricular diastolic parameters were normal. Right Ventricle: The right ventricular size is normal. No increase in right ventricular wall thickness. Right ventricular systolic function is normal. There is normal pulmonary artery systolic pressure. The tricuspid regurgitant velocity is 1.87 m/s, and  with an assumed right atrial pressure of 10 mmHg, the estimated right ventricular systolic pressure is 24.0 mmHg. Left Atrium: Left atrial size was normal in size. Right Atrium: Right atrial size was normal in size. Pericardium: There is no evidence of pericardial effusion. Mitral  Valve: The mitral valve is normal in structure. Trivial mitral valve regurgitation. Tricuspid Valve: The tricuspid valve is normal in structure. Tricuspid valve regurgitation is trivial. Aortic Valve: The aortic valve is normal in structure. Aortic valve regurgitation is trivial. Aortic valve mean gradient measures 5.5 mmHg. Aortic valve peak gradient measures 9.6 mmHg. Aortic valve area, by VTI measures 1.66 cm. Pulmonic Valve: The pulmonic valve was normal in structure. Pulmonic valve regurgitation is not visualized. Aorta: The aortic root and ascending aorta are structurally normal, with no evidence of dilitation. IAS/Shunts: Evidence of atrial level shunting detected by color flow Doppler. Agitated saline contrast was given intravenously to evaluate for intracardiac shunting. Agitated saline contrast bubble study was positive with shunting observed within 3-6 cardiac cycles suggestive of interatrial shunt. A small patent foramen ovale is detected with bidirectional shunting across atrial septum.  LEFT VENTRICLE PLAX 2D LVIDd:         3.20 cm  Diastology LVIDs:         2.32 cm  LV e' lateral:   10.30 cm/s LV PW:         0.96 cm  LV E/e' lateral: 6.4 LV IVS:        0.66 cm  LV e' medial:    6.31 cm/s LVOT diam:     2.00 cm  LV E/e' medial:  10.5 LV SV:         42 LV SV Index:   25 LVOT Area:     3.14 cm  RIGHT VENTRICLE RV Basal diam:  2.60 cm RV S prime:     19.10 cm/s TAPSE (M-mode): 3.0 cm LEFT ATRIUM             Index       RIGHT ATRIUM          Index LA diam:        2.10 cm 1.26 cm/m  RA Area:     7.57 cm LA Vol (A2C):   23.9 ml 14.37 ml/m RA Volume:   12.90 ml 7.75 ml/m LA Vol (A4C):   13.8 ml 8.29 ml/m LA Biplane Vol: 19.2 ml 11.54 ml/m  AORTIC VALVE AV Area (Vmax):    1.69 cm AV Area (Vmean):   1.75 cm AV Area (VTI):     1.66 cm AV Vmax:           155.00 cm/s AV Vmean:          106.500 cm/s AV VTI:  0.254 m AV Peak Grad:      9.6 mmHg AV Mean Grad:      5.5 mmHg LVOT Vmax:          83.60 cm/s LVOT Vmean:        59.400 cm/s LVOT VTI:          0.134 m LVOT/AV VTI ratio: 0.53  AORTA Ao Root diam: 2.50 cm MITRAL VALVE               TRICUSPID VALVE MV Area (PHT): 3.34 cm    TR Peak grad:   14.0 mmHg MV Decel Time: 227 msec    TR Vmax:        187.00 cm/s MV E velocity: 66.00 cm/s MV A velocity: 79.30 cm/s  SHUNTS MV E/A ratio:  0.83        Systemic VTI:  0.13 m                            Systemic Diam: 2.00 cm Arnoldo Hooker MD Electronically signed by Arnoldo Hooker MD Signature Date/Time: 05/30/2020/12:54:15 PM    Final     Medications:  I have reviewed the patient's current medications. Scheduled: . aspirin EC  81 mg Oral Daily  . atorvastatin  40 mg Oral Daily  . Chlorhexidine Gluconate Cloth  6 each Topical Daily  . DULoxetine  60 mg Oral Daily  . pantoprazole  40 mg Oral Daily  . sodium chloride flush  3 mL Intravenous Once  . sodium chloride flush  3 mL Intravenous Q12H  . tiotropium  18 mcg Inhalation Daily    Assessment/Plan: 59 y.o. female with a history of COPD, DM, HTN, hypothyroidism, sleep apnea and depression who reports being under a lot of stress recently.  Went to bed Saturday feeling poorly.  On Sunday after awakening was noted to have difficulty walking, change in vision and right sided weakness.  Acute infarct suspected.  tPA administered.  Initial head CT personally reviewed and shows no acute changes.  CTA of the head and neck shows no evidence of LVO with moderate short segment stenosis of the left P2.  Patient currently improved. Echocardiogram shows EF of 50-55% with evidence of PFO.  A1c 5.9, LDL 86.      Stroke Risk Factors - diabetes mellitus and hypertension  Plan: 1. Aggressive lipid management with target LDL<70. 2. ASA at  daily 3. PT consult, OT consult, Speech consult 4. Cardiology to see patient for PFO.  Patient sees Dr. Lady Gary on an outpatient basis 5. . Telemetry monitoring 6. Frequent neuro checks 7. Follow up with neurology on  an outpatient basis    LOS: 3 days   Thana Farr, MD Neurology [phone] 05/31/2020  12:44 PM

## 2020-05-31 NOTE — Evaluation (Signed)
Physical Therapy Evaluation Patient Details Name: Kerri Steele MRN: [id number] DOB: 09/16/1961 Today's Date: 05/31/2020   History of Present Illness  Kerri Steele is a 59 y/o female who was admitted for CVA in medial L occipital lobe. Chief complaints included decreased vision, numbness of L tongue with facila droop, and R arm and leg weakness. She was administered TPA on 7/18. PMH includes HTN, HLD, COPD, DM, anxiety, asthma, GERD, SOB dyspnea, and sleep apnea.  Clinical Impression  Pt sitting EOB upon arrival to room with husband at bedside. Pt reporting that her R eye vision continues to be slightly blurry from time to time and is not constant. Pt reporting that her RUE sensation is slightly different from LUE feeling tingly sensations. Noted slight generalized weakness (R>L). Pt performed independent bed mobility and transferred sit <> stand and ambulate with CGA. Noted no overt LOB with OOB mobility. HR, RR, and SpO2 remained WNL throughout session. Pt reports that she is progressively getting back to baseline and states that she thinks she will continue to do so and declines further PT services. Pt has 24/7 assist in husband at home and anticipate any needs pt may have will be met by pt's husband. Will d/c in house. If pt has decrease in status or functional mobility, please re-consult.     Follow Up Recommendations No PT follow up    Equipment Recommendations  None recommended by PT    Recommendations for Other Services       Precautions / Restrictions Precautions Precautions: Fall Restrictions Weight Bearing Restrictions: No      Mobility  Bed Mobility Overal bed mobility: Independent             General bed mobility comments: pt independent with supine <> sit transfers with normal spped of movements  Transfers Overall transfer level: Needs assistance Equipment used: None Transfers: Sit to/from Stand Sit to Stand: Supervision         General transfer comment: Pt  supervision/CGA for sit <> stand transfer for safety due to pt reports of feeling weak.  Ambulation/Gait Ambulation/Gait assistance: Min guard Gait Distance (Feet): 15 Feet Assistive device: None Gait Pattern/deviations: Step-through pattern Gait velocity: slightly decreased   General Gait Details: pt ambulated 15 feet in room with CGA; noted no overt LOB with 180 degree turn; pt with slightly guarded gait  Stairs            Wheelchair Mobility    Modified Rankin (Stroke Patients Only)       Balance Overall balance assessment: Needs assistance Sitting-balance support: Feet supported Sitting balance-Leahy Scale: Good Sitting balance - Comments: no overt LOB while sitting EOB   Standing balance support: No upper extremity supported Standing balance-Leahy Scale: Good Standing balance comment: no overt LOB with static standing balance                             Pertinent Vitals/Pain Pain Assessment: No/denies pain    Home Living Family/patient expects to be discharged to:: Private residence Living Arrangements: Spouse/significant other Available Help at Discharge: Family;Available 24 hours/day Type of Home: House Home Access: Stairs to enter Entrance Stairs-Rails: None Entrance Stairs-Number of Steps: 3 Home Layout: One level Home Equipment: None      Prior Function Level of Independence: Independent         Comments: Pt reports independence with all ambulation and transfers and reports that her ambulation distance is limited secondary to COPD.  Hand Dominance        Extremity/Trunk Assessment   Upper Extremity Assessment Upper Extremity Assessment: Generalized weakness;RUE deficits/detail (RUE grossly 3+/5; LUE grossly 4-/5 ) RUE Deficits / Details: Pt reports tingling sensation throughout RUE. Coordination equal to LUE with alternating pronation/supination. RUE Coordination: WNL    Lower Extremity Assessment Lower Extremity  Assessment: Generalized weakness;LLE deficits/detail (grossly 4-/5 bilaterally) LLE Deficits / Details: pt reports previous neuropathy from having shingles as an adult 8 years ago - pt very sensitive to touch and states that she has not worn socks or shoes for 8 years  LLE Sensation: history of peripheral neuropathy    Cervical / Trunk Assessment Cervical / Trunk Assessment: Normal  Communication   Communication: No difficulties  Cognition Arousal/Alertness: Awake/alert Behavior During Therapy: WFL for tasks assessed/performed Overall Cognitive Status: Within Functional Limits for tasks assessed                                 General Comments: Pt A&O x 4.       General Comments      Exercises Other Exercises Other Exercises: pt performed static standing balance with EO, WBOS, EO NBOS, EC WBOS, and EC NBOS; no overt LOB noted; truncal sway in multiple directions with eyes closed narrow BOS with SBA for safety   Assessment/Plan    PT Assessment Patent does not need any further PT services  PT Problem List         PT Treatment Interventions      PT Goals (Current goals can be found in the Care Plan section)  Acute Rehab PT Goals Patient Stated Goal: to go home PT Goal Formulation: With patient Time For Goal Achievement: 05/31/20 Potential to Achieve Goals: Good    Frequency     Barriers to discharge        Co-evaluation               AM-PAC PT "6 Clicks" Mobility  Outcome Measure Help needed turning from your back to your side while in a flat bed without using bedrails?: None Help needed moving from lying on your back to sitting on the side of a flat bed without using bedrails?: None Help needed moving to and from a bed to a chair (including a wheelchair)?: A Little Help needed standing up from a chair using your arms (e.g., wheelchair or bedside chair)?: A Little Help needed to walk in hospital room?: A Little Help needed climbing 3-5 steps  with a railing? : A Little 6 Click Score: 20    End of Session Equipment Utilized During Treatment: Gait belt Activity Tolerance: Patient tolerated treatment well Patient left: in bed;with call bell/phone within reach;with family/visitor present Nurse Communication: Mobility status PT Visit Diagnosis: Muscle weakness (generalized) (M62.81);Other symptoms and signs involving the nervous system (R29.898)    Time: 9379-0240 PT Time Calculation (min) (ACUTE ONLY): 20 min   Charges:              Vale Haven, SPT  Vale Haven 05/31/2020, 12:56 PM

## 2020-06-01 ENCOUNTER — Encounter: Payer: Self-pay | Admitting: Cardiology

## 2020-06-01 ENCOUNTER — Inpatient Hospital Stay
Admit: 2020-06-01 | Discharge: 2020-06-01 | Disposition: A | Discharge status: Home / Self Care | Payer: Medicare Other | Attending: Internal Medicine | Admitting: Internal Medicine

## 2020-06-01 ENCOUNTER — Encounter
Admission: EM | Disposition: A | Discharge status: Home / Self Care | Payer: Self-pay | Source: Home / Self Care | Attending: Internal Medicine

## 2020-06-01 DIAGNOSIS — Q2112 Patent foramen ovale: Secondary | ICD-10-CM

## 2020-06-01 DIAGNOSIS — Q211 Atrial septal defect: Secondary | ICD-10-CM

## 2020-06-01 DIAGNOSIS — K219 Gastro-esophageal reflux disease without esophagitis: Secondary | ICD-10-CM | POA: Diagnosis present

## 2020-06-01 DIAGNOSIS — E785 Hyperlipidemia, unspecified: Secondary | ICD-10-CM | POA: Diagnosis present

## 2020-06-01 DIAGNOSIS — G894 Chronic pain syndrome: Secondary | ICD-10-CM | POA: Diagnosis present

## 2020-06-01 DIAGNOSIS — E1169 Type 2 diabetes mellitus with other specified complication: Secondary | ICD-10-CM | POA: Diagnosis present

## 2020-06-01 DIAGNOSIS — I10 Essential (primary) hypertension: Secondary | ICD-10-CM | POA: Diagnosis present

## 2020-06-01 DIAGNOSIS — J449 Chronic obstructive pulmonary disease, unspecified: Secondary | ICD-10-CM | POA: Diagnosis present

## 2020-06-01 HISTORY — PX: LOOP RECORDER INSERTION: EP1214

## 2020-06-01 HISTORY — PX: TEE WITHOUT CARDIOVERSION: SHX5443

## 2020-06-01 LAB — SURGICAL PCR SCREEN
MRSA, PCR: NEGATIVE
Staphylococcus aureus: POSITIVE — AB

## 2020-06-01 LAB — GLUCOSE, CAPILLARY
Glucose-Capillary: 98 mg/dL (ref 70–99)
Glucose-Capillary: 110 mg/dL — ABNORMAL HIGH (ref 70–99)
Glucose-Capillary: 125 mg/dL — ABNORMAL HIGH (ref 70–99)

## 2020-06-01 SURGERY — ECHOCARDIOGRAM, TRANSESOPHAGEAL
Anesthesia: Moderate Sedation

## 2020-06-01 SURGERY — LOOP RECORDER INSERTION
Anesthesia: Moderate Sedation

## 2020-06-01 MED ORDER — BUTAMBEN-TETRACAINE-BENZOCAINE 2-2-14 % EX AERO
INHALATION_SPRAY | CUTANEOUS | Status: AC
  Administered 2020-06-01: 2
  Filled 2020-06-01: qty 5

## 2020-06-01 MED ORDER — MIDAZOLAM HCL 5 MG/5ML IJ SOLN
INTRAMUSCULAR | Status: AC | PRN
  Administered 2020-06-01: 1 mg via INTRAVENOUS

## 2020-06-01 MED ORDER — ATORVASTATIN CALCIUM 40 MG PO TABS: 40.0000 mg | ORAL_TABLET | Freq: Every day | ORAL | 0 refills | Status: DC

## 2020-06-01 MED ORDER — SODIUM CHLORIDE 0.9 % IV SOLN: INTRAVENOUS | Status: DC

## 2020-06-01 MED ORDER — ASPIRIN 81 MG PO TBEC: 81.0000 mg | DELAYED_RELEASE_TABLET | Freq: Every day | ORAL | 0 refills | Status: AC

## 2020-06-01 MED ORDER — LIDOCAINE-EPINEPHRINE (PF) 1 %-1:200000 IJ SOLN
INTRAMUSCULAR | Status: AC
  Filled 2020-06-01: qty 20

## 2020-06-01 MED ORDER — FENTANYL CITRATE (PF) 100 MCG/2ML IJ SOLN
INTRAMUSCULAR | Status: AC
  Filled 2020-06-01: qty 2

## 2020-06-01 MED ORDER — LIDOCAINE VISCOUS HCL 2 % MT SOLN
OROMUCOSAL | Status: AC
  Administered 2020-06-01: 15 mL
  Filled 2020-06-01: qty 15

## 2020-06-01 MED ORDER — SODIUM CHLORIDE FLUSH 0.9 % IV SOLN
INTRAVENOUS | Status: AC
  Filled 2020-06-01: qty 10

## 2020-06-01 MED ORDER — MIDAZOLAM HCL 2 MG/2ML IJ SOLN
INTRAMUSCULAR | Status: AC | PRN
  Administered 2020-06-01: 2 mg via INTRAVENOUS

## 2020-06-01 MED ORDER — CEFAZOLIN SODIUM-DEXTROSE 2-4 GM/100ML-% IV SOLN
2.0000 g | INTRAVENOUS | Status: DC
  Filled 2020-06-01: qty 100

## 2020-06-01 MED ORDER — MIDAZOLAM HCL 5 MG/5ML IJ SOLN
INTRAMUSCULAR | Status: AC
  Filled 2020-06-01: qty 5

## 2020-06-01 MED ORDER — LIDOCAINE-EPINEPHRINE (PF) 1 %-1:200000 IJ SOLN
INTRAMUSCULAR | Status: DC | PRN
  Administered 2020-06-01: 10 mL

## 2020-06-01 MED ORDER — FENTANYL CITRATE (PF) 100 MCG/2ML IJ SOLN
INTRAMUSCULAR | Status: AC | PRN
  Administered 2020-06-01: 50 ug via INTRAVENOUS
  Administered 2020-06-01: 25 ug via INTRAVENOUS

## 2020-06-01 MED ORDER — INSULIN ASPART 100 UNIT/ML ~~LOC~~ SOLN: 0.0000 [IU] | Freq: Every day | SUBCUTANEOUS | Status: DC

## 2020-06-01 MED ORDER — INSULIN ASPART 100 UNIT/ML ~~LOC~~ SOLN: 0.0000 [IU] | Freq: Three times a day (TID) | SUBCUTANEOUS | Status: DC

## 2020-06-01 SURGICAL SUPPLY — 2 items
LOOP REVEAL LINQSYS (Prosthesis & Implant Heart) ×3 IMPLANT
PACK LOOP INSERTION (CUSTOM PROCEDURE TRAY) ×3 IMPLANT

## 2020-06-01 NOTE — Discharge Instructions (Signed)
Patent Foramen Ovale, Adult   A foramen ovale is a hole between the upper chambers (right atrium and left atrium) of the heart. Before you are born, it is normal to have this hole in your heart. The hole allows blood to circulate through the body without having to go through the lungs. After your birth, when you are able to breathe, you do not need the foramen ovale and it usually closes. If the hole does not close, it is called a patent foramen ovale (PFO). PFO is a common condition. Most people do not know they have this hole and they do not have any health problems caused by it. What are the causes? The cause of this condition is not known. What increases the risk? You are more likely to develop this condition if:  You have a family history of PFO.  You also have other heart disease that is present at birth (congenital heart disease). What are the signs or symptoms? In most cases, there are no symptoms of this condition. Possible rare symptoms include:  Stroke caused by a blood clot.  Transient ischemic attack (TIA). This is a "warning stroke" that causes stroke-like symptoms that go away quickly.  Migraine headaches. How is this diagnosed? This condition may be diagnosed based on:  A physical exam and your medical history.  Echocardiogram. This test uses sound waves to produce images of the heart.  Transesophageal echocardiogram (TEE). This type of echocardiogram is performed by placing a probe in the part of the body that moves food from the mouth to the stomach (esophagus).  Electrocardiogram (ECG). This test identifies changes in the electrical activity of the heart.  Cardiac MRI. This is an imaging technique that is used to visualize the heart, if further images are needed after TEE. How is this treated? Usually, no treatment is needed. If your condition is associated with symptoms or blood clots, you may need:  Medicines to prevent blood clots and strokes (anticoagulant or  antiplatelet medicines).  A surgical procedure to close the hole (transcatheter closure). Follow these instructions at home:  Take over-the-counter and prescription medicines only as told by your health care provider.  Keep all follow-up visits as told by your health care provider. This is important. Contact a health care provider if:  You have a fever.  You have frequent or severe headaches. Get help right away if:  Your skin turns blue.  You have chest pain or difficulty breathing.  You have any symptoms of stroke. "BE FAST" is an easy way to remember the main warning signs of stroke: ? B - Balance. Signs are dizziness, sudden trouble walking, or loss of balance. ? E - Eyes. Signs are trouble seeing or a sudden change in vision. ? F - Face. Signs are sudden weakness or numbness of the face, or the face or eyelid drooping on one side. ? A - Arms. Signs are weakness or numbness in an arm. This happens suddenly and usually on one side of the body. ? S - Speech. Signs are sudden trouble speaking, slurred speech, or trouble understanding what people say. ? T - Time. Time to call emergency services. Write down what time symptoms started.  You have other signs of stroke, which may include: ? A sudden, severe headache with no known cause. ? Nausea or vomiting. ? Seizure. These symptoms may represent a serious problem that is an emergency. Do not wait to see if the symptoms will go away. Get medical help right away.   Call your local emergency services (911 in the [place] Do not drive yourself to the hospital. Summary  A patent foramen ovale is a hole between the upper chambers (right atrium and left atrium) of your heart. The cause of this condition is not known.  You may not know that you have a hole in your heart, and you may not have any health problems associated with it.  Usually, no treatment is needed for this condition unless it is associated with symptoms or blood clots. This  information is not intended to replace advice given to you by your health care provider. Make sure you discuss any questions you have with your health care provider. Document Revised: 01/02/2018 Document Reviewed: 01/07/2017 Elsevier Patient Education  2020 [employer]vier Inc.  

## 2020-06-01 NOTE — Care Management Important Message (Signed)
Important Message  Patient Details  Name: Kerri Steele MRN: [id number] Date of Birth: [birth year]   Medicare Important Message Given:  Yes     [name] 06/01/2020, 1:11 PM

## 2020-06-01 NOTE — Progress Notes (Signed)
*  PRELIMINARY RESULTS* Echocardiogram Echocardiogram Transesophageal has been performed.  Cristela Blue 06/01/2020, 1:48 PM

## 2020-06-01 NOTE — Progress Notes (Signed)
Subjective: No new neurological complaints.    Objective: Current vital signs: BP (!) 143/73 (BP Location: Left Arm)   Pulse 77   Temp 98.4 F (36.9 C) (Oral)   Resp 16   Ht 5' (1.524 m)   Wt 69.1 kg   SpO2 99%   BMI 29.75 kg/m  Vital signs in last 24 hours: Temp:  [98 F (36.7 C)-98.6 F (37 C)] 98.4 F (36.9 C) (07/22 1337) Pulse Rate:  [70-101] 77 (07/22 1337) Resp:  [9-33] 16 (07/22 1337) BP: (115-143)/(66-114) 143/73 (07/22 1337) SpO2:  [95 %-100 %] 99 % (07/22 1337)  Intake/Output from previous day: No intake/output data recorded. Intake/Output this shift: No intake/output data recorded. Nutritional status:  Diet Order            Diet - low sodium heart healthy                 Neurologic Exam: Mental Status: Alert, oriented, thought content appropriate. Speech fluent without evidence of aphasia. Able to follow 3 step commands without difficulty.  Cranial Nerves: II: Visual fields grossly normal, pupils equal, round, reactive to light and accommodation III,IV, VI: ptosis not present, extra-ocular motions intact bilaterally V,VII: smile symmetric, facial light touch sensation normal bilaterally VIII: hearing normal bilaterally IX,X: gag reflex present XI: bilateral shoulder shrug XII: midline tongue extension Motor: Generalized weakness throughout with give-way weakness noted in the upper extremities. RLE 5-/5, LLE 4/5 Sensory: Pinprick and light touchdecreased in the RLE  Lab Results: Basic Metabolic Panel: Recent Labs  Lab 05/28/20 1625 05/29/20 0439 05/30/20 0602  NA 140 143 144  K 3.4* 3.1* 3.5  CL 104 107 107  CO2 27 30 22   GLUCOSE 108* 79 111*  BUN 18 14 17   CREATININE 1.01* 0.99 0.80  CALCIUM 9.6 9.0 9.7  MG  --  2.3 2.4  PHOS  --  3.9 3.2    Liver Function Tests: Recent Labs  Lab 05/28/20 1625 05/30/20 0602  AST 30 23  ALT 27 22  ALKPHOS 94 80  BILITOT 0.6 0.7  PROT 7.4 7.4  ALBUMIN 4.3 4.0   No results for input(s):  LIPASE, AMYLASE in the last 168 hours. No results for input(s): AMMONIA in the last 168 hours.  CBC: Recent Labs  Lab 05/28/20 1625 05/29/20 0439 05/30/20 0602  WBC 11.1* 7.7 10.0  NEUTROABS 6.4  --   --   HGB 12.7 11.6* 12.7  HCT 41.1 38.1 41.2  MCV 79.0* 79.2* 78.0*  PLT 347 299 330    Cardiac Enzymes: No results for input(s): CKTOTAL, CKMB, CKMBINDEX, TROPONINI in the last 168 hours.  Lipid Panel: Recent Labs  Lab 05/30/20 0602  CHOL 155  TRIG 97  HDL 50  CHOLHDL 3.1  VLDL 19  LDLCALC 86    CBG: Recent Labs  Lab 05/28/20 1620 05/28/20 1954 06/01/20 1222 06/01/20 1400  GLUCAP 97 128* 98 110*    Microbiology: Results for orders placed or performed during the hospital encounter of 05/28/20  SARS Coronavirus 2 by RT PCR (hospital order, performed in [facility] hospital lab) Nasopharyngeal Nasopharyngeal Swab     Status: None   Collection Time: 05/28/20  6:15 PM   Specimen: Nasopharyngeal Swab  Result Value Ref Range Status   SARS Coronavirus 2 NEGATIVE NEGATIVE Final    Comment: (NOTE) SARS-CoV-2 target nucleic acids are NOT DETECTED.  The SARS-CoV-2 RNA is generally detectable in upper and lower respiratory specimens during the acute phase of infection. The lowest  concentration of SARS-CoV-2 viral copies this assay can detect is 250 copies / mL. A negative result does not preclude SARS-CoV-2 infection and should not be used as the sole basis for treatment or other patient management decisions.  A negative result may occur with improper specimen collection / handling, submission of specimen other than nasopharyngeal swab, presence of viral mutation(s) within the areas targeted by this assay, and inadequate number of viral copies (<250 copies / mL). A negative result must be combined with clinical observations, patient history, and epidemiological information.  Fact Sheet for Patients:   BoilerBrush.com.cy  Fact Sheet for  Healthcare Providers: https://pope.com/  This test is not yet approved or  cleared by the [place] FDA and has been authorized for detection and/or diagnosis of SARS-CoV-2 by FDA under an Emergency Use Authorization (EUA).  This EUA will remain in effect (meaning this test can be used) for the duration of the COVID-19 declaration under Section 564(b)(1) of the Act, 21 U.S.C. section 360bbb-3(b)(1), unless the authorization is terminated or revoked sooner.  Performed at [facility], [address] [place], [place] [address]   MRSA PCR Screening     Status: None   Collection Time: 05/28/20  7:55 PM   Specimen: Nasal Mucosa; Nasopharyngeal  Result Value Ref Range Status   MRSA by PCR NEGATIVE NEGATIVE Final    Comment:        The GeneXpert MRSA Assay (FDA approved for NASAL specimens only), is one component of a comprehensive MRSA colonization surveillance program. It is not intended to diagnose MRSA infection nor to guide or monitor treatment for MRSA infections. Performed at [facility], [address], [place], [place] [address]   Surgical PCR screen     Status: Abnormal   Collection Time: 06/01/20  8:08 AM   Specimen: Nasal Mucosa; Nasal Swab  Result Value Ref Range Status   MRSA, PCR NEGATIVE NEGATIVE Final   Staphylococcus aureus POSITIVE (A) NEGATIVE Final    Comment: (NOTE) The Xpert SA Assay (FDA approved for NASAL specimens in patients 39 years of age and older), is one component of a comprehensive surveillance program. It is not intended to diagnose infection nor to guide or monitor treatment. Performed at [facility], [address] [place], [place] [address]     Coagulation Studies: No results for input(s): LABPROT, INR in the last 72 hours.  Imaging: EP PPM/ICD IMPLANT  Result Date: 06/01/2020 Successful Reveal Linq loop monitor implantation  US Venous Img Lower Bilateral (DVT)  Result  Date: 05/30/2020 CLINICAL DATA:  Bilateral lower extremity pain for the past 2 days. Evaluate for DVT. EXAM: BILATERAL LOWER EXTREMITY VENOUS DOPPLER ULTRASOUND TECHNIQUE: Gray-scale sonography with graded compression, as well as color Doppler and duplex ultrasound were performed to evaluate the lower extremity deep venous systems from the level of the common femoral vein and including the common femoral, femoral, profunda femoral, popliteal and calf veins including the posterior tibial, peroneal and gastrocnemius veins when visible. The superficial great saphenous vein was also interrogated. Spectral Doppler was utilized to evaluate flow at rest and with distal augmentation maneuvers in the common femoral, femoral and popliteal veins. COMPARISON:  None. FINDINGS: RIGHT LOWER EXTREMITY Common Femoral Vein: No evidence of thrombus. Normal compressibility, respiratory phasicity and response to augmentation. Saphenofemoral Junction: No evidence of thrombus. Normal compressibility and flow on color Doppler imaging. Profunda Femoral Vein: No evidence of thrombus. Normal compressibility and flow on color Doppler imaging. Femoral Vein: No evidence of thrombus.  Normal compressibility, respiratory phasicity and response to augmentation. Popliteal Vein: No evidence of thrombus. Normal compressibility, respiratory phasicity and response to augmentation. Calf Veins: No evidence of thrombus. Normal compressibility and flow on color Doppler imaging. Superficial Great Saphenous Vein: No evidence of thrombus. Normal compressibility. Venous Reflux:  None. Other Findings:  None. LEFT LOWER EXTREMITY Common Femoral Vein: No evidence of thrombus. Normal compressibility, respiratory phasicity and response to augmentation. Saphenofemoral Junction: No evidence of thrombus. Normal compressibility and flow on color Doppler imaging. Profunda Femoral Vein: No evidence of thrombus. Normal compressibility and flow on color Doppler imaging.  Femoral Vein: No evidence of thrombus. Normal compressibility, respiratory phasicity and response to augmentation. Popliteal Vein: No evidence of thrombus. Normal compressibility, respiratory phasicity and response to augmentation. Calf Veins: No evidence of thrombus. Normal compressibility and flow on color Doppler imaging. Superficial Great Saphenous Vein: No evidence of thrombus. Normal compressibility. Venous Reflux:  None. Other Findings:  None. IMPRESSION: No evidence of DVT within either lower extremity. Electronically Signed   By: Simonne Come M.D.   On: 05/30/2020 17:03    Medications:  I have reviewed the patient's current medications. Scheduled: . aspirin EC  81 mg Oral Daily  . atorvastatin  40 mg Oral Daily  . Chlorhexidine Gluconate Cloth  6 each Topical Daily  . DULoxetine  60 mg Oral Daily  . fentaNYL      . insulin aspart  0-5 Units Subcutaneous QHS  . insulin aspart  0-9 Units Subcutaneous TID WC  . midazolam      . pantoprazole  40 mg Oral Daily  . sodium chloride flush  3 mL Intravenous Once  . sodium chloride flush  3 mL Intravenous Q12H  . sodium chloride flush      . tiotropium  18 mcg Inhalation Daily    Assessment/Plan: 59 y.o.femalewith a history of COPD, DM, HTN, hypothyroidism, sleep apnea and depression who reports being under a lot of stress recently. Went to bed Saturday feeling poorly. On Sunday after awakening was noted to have difficulty walking, change in vision and right sided weakness. Acute infarct suspected. tPA administered. Initial head CT personally reviewed and shows no acute changes. CTA of the head and neck shows no evidence of LVO with moderate short segment stenosis of the left P2. Patient currently improved. Echocardiogram shows EF of 50-55% with evidence of PFO.  A1c 5.9, LDL 86.    TEE performed today by cardiology.  LINQ to be placed as well.    Stroke Risk Factors -diabetes mellitus and hypertension  Plan: 1. Aggressive  lipid management with target LDL<70. 2. ASA at 81mg  daily 3. PT consult, OT consult, Speech consult 4. Patient to follow up with neurology and cardiology on an outpatient basis.     LOS: 4 days   , MD Neurology [phone] 06/01/2020  2:20 PM

## 2020-06-01 NOTE — Progress Notes (Signed)
[facility] Encounter Note  Patient: Kerri Steele / Admit Date: 05/28/2020 / Date of Encounter: 06/01/2020, 1:43 PM   Subjective: Patient is overall feeling quite well at this time with no evidence of significant neurologic abnormalities after previous stroke.  The patient does have hypertension hyperlipidemia for which the patient has had appropriate medication management. Transesophageal echocardiogram performed showing normal valves normal LV systolic function and no evidence of vegetations or other valvular source of embolus.  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  This will remain for further evaluation as an outpatient and need for anticoagulation  Review of Systems: Positive for: None Negative for: Vision change, hearing change, syncope, dizziness, nausea, vomiting,diarrhea, bloody stool, stomach pain, cough, congestion, diaphoresis, urinary frequency, urinary pain,skin lesions, skin rashes Others previously listed  Objective: Telemetry: Normal sinus rhythm Physical Exam: Blood pressure (!) 143/73, pulse 77, temperature 98.4 F (36.9 C), temperature source Oral, resp. rate 16, height 5' (1.524 m), weight 69.1 kg, SpO2 99 %. Body mass index is 29.75 kg/m. General: Well developed, well nourished, in no acute distress. Head: Normocephalic, atraumatic, sclera non-icteric, no xanthomas, nares are without discharge. Neck: No apparent masses Lungs: Normal respirations with no wheezes, no rhonchi, no rales , no crackles   Heart: Regular rate and rhythm, normal S1 S2, no murmur, no rub, no gallop, PMI is normal size and placement, carotid upstroke normal without bruit, jugular venous pressure normal Abdomen: Soft, non-tender, non-distended  with normoactive bowel sounds. No hepatosplenomegaly. Abdominal aorta is normal size without bruit Extremities: No edema, no clubbing, no cyanosis, no ulcers,  Peripheral: 2+ radial, 2+ femoral, 2+ dorsal pedal pulses Neuro: Alert and oriented. Moves all extremities spontaneously. Psych:  Responds to questions appropriately with a normal affect.   Intake/Output Summary (Last 24 hours) at 06/01/2020 1343 Last data filed at 06/01/2020 0522 Gross per 24 hour  Intake --  Output 0 ml  Net 0 ml    Inpatient Medications:  . [MAR Hold] aspirin EC  81 mg Oral Daily  . [MAR Hold] atorvastatin  40 mg Oral Daily  . [MAR Hold] Chlorhexidine Gluconate Cloth  6 each Topical Daily  . [MAR Hold] DULoxetine  60 mg Oral Daily  . fentaNYL      . insulin aspart  0-5 Units Subcutaneous QHS  . insulin aspart  0-9 Units Subcutaneous TID WC  . midazolam      . [MAR Hold] pantoprazole  40 mg Oral Daily  . [MAR Hold] sodium chloride flush  3 mL Intravenous Once  . [MAR Hold] sodium chloride flush  3 mL Intravenous Q12H  . sodium chloride flush      . [MAR Hold] tiotropium  18 mcg Inhalation Daily   Infusions:  . [MAR Hold] sodium chloride    . sodium chloride    . sodium chloride 50 mL/hr at 06/01/20 1206  .  ceFAZolin (ANCEF) IV      Labs: Recent Labs    05/30/20 0602  NA 144  K 3.5  CL 107  CO2 22  GLUCOSE 111*  BUN 17  CREATININE 0.80  CALCIUM 9.7  MG 2.4  PHOS 3.2   Recent Labs    05/30/20 0602  AST 23  ALT 22  ALKPHOS 80  BILITOT 0.7  PROT 7.4  ALBUMIN 4.0   Recent Labs    05/30/20 0602  WBC 10.0  HGB 12.7  HCT 41.2  MCV 78.0*  PLT 330   No results for input(s): CKTOTAL, CKMB, TROPONINI in the last 72 hours. Invalid input(s): POCBNP Recent Labs    05/30/20 0602  HGBA1C 5.9*     Weights: Filed Weights   05/29/20 0500 05/30/20 0500 05/31/20 0500  Weight: 69.1 kg 69.2 kg 69.1 kg     Radiology/Studies:  MR BRAIN WO CONTRAST  Result Date:  05/30/2020 CLINICAL DATA:  Stroke, follow-up. EXAM: MRI HEAD WITHOUT CONTRAST TECHNIQUE: Multiplanar, multiecho pulse sequences of the brain and surrounding structures were obtained without intravenous contrast. COMPARISON:  05/28/2020 noncontrast head CT and CTA head and neck. FINDINGS: Brain: Focal restricted diffusion involving the medial left occipital lobe with associated T2/FLAIR hyperintense signal (5:27) and mild gyral edema. Additional punctate foci of restricted diffusion are seen inferiorly within the medial left occipital lobe (5:21). No intracranial hemorrhage. No midline shift, ventriculomegaly or extra-axial fluid collection. No mass lesion. Vascular: Intracranial vessels are better evaluated on recent CTA head. Skull and upper cervical spine: Normal marrow signal. Sinuses/Orbits: Normal orbits. Clear paranasal sinuses. Trace right and small left mastoid effusions. Other: None. 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 These results were called by telephone at the time of interpretation on 05/30/2020 at 12:28 pm to provider [facility] , who verbally acknowledged these results. Electronically Signed   By: Stana Buntinghikanele  Emekauwa M.D.   On: 05/30/2020 12:39   EP PPM/ICD IMPLANT  Result Date: 06/01/2020 Successful Reveal Linq loop monitor implantation  US Venous Img Lower Bilateral (DVT)  Result Date: 05/30/2020 CLINICAL DATA:  Bilateral lower extremity pain for the past 2 days. Evaluate for DVT. EXAM: BILATERAL LOWER EXTREMITY VENOUS DOPPLER ULTRASOUND TECHNIQUE: Gray-scale sonography with graded compression, as well as color Doppler and duplex ultrasound were performed to evaluate the lower extremity deep venous systems from the level of the common femoral vein and including the common femoral, femoral, profunda femoral, popliteal and calf veins including the posterior tibial, peroneal and gastrocnemius veins when visible. The superficial great saphenous vein was also  interrogated. Spectral Doppler was utilized to evaluate flow at rest and with distal augmentation maneuvers in the common femoral, femoral and popliteal veins. COMPARISON:  None. FINDINGS: RIGHT LOWER EXTREMITY Common Femoral Vein: No evidence of thrombus. Normal compressibility, respiratory phasicity and response to augmentation. Saphenofemoral Junction: No evidence of thrombus. Normal compressibility and flow on color Doppler imaging. Profunda Femoral Vein: No evidence of thrombus. Normal compressibility and flow on color Doppler imaging. Femoral Vein: No evidence of thrombus. Normal compressibility, respiratory phasicity and response to augmentation. Popliteal Vein: No evidence of thrombus. Normal compressibility, respiratory phasicity and response to augmentation. Calf Veins: No evidence of thrombus. Normal compressibility and flow on color Doppler imaging. Superficial Great Saphenous Vein: No evidence of thrombus. Normal compressibility. Venous Reflux:  None. Other Findings:  None. LEFT LOWER EXTREMITY Common Femoral Vein: No evidence of thrombus. Normal compressibility, respiratory phasicity and response to augmentation. Saphenofemoral Junction: No evidence of thrombus. Normal compressibility and flow on color Doppler imaging. Profunda Femoral Vein: No evidence of thrombus. Normal compressibility and flow on color Doppler imaging. Femoral Vein: No evidence of thrombus. Normal compressibility, respiratory phasicity and response to augmentation. Popliteal Vein: No evidence of thrombus. Normal compressibility, respiratory phasicity and response to augmentation. Calf Veins: No evidence of thrombus. Normal compressibility and flow on color Doppler imaging. Superficial Great Saphenous  Vein: No evidence of thrombus. Normal compressibility. Venous Reflux:  None. Other Findings:  None. IMPRESSION: No evidence of DVT within either lower extremity. Electronically Signed   By: Simonne Come M.D.   On: 05/30/2020 17:03    DG Chest Port 1 View  Result Date: 05/29/2020 CLINICAL DATA:  Acute CVA. EXAM: PORTABLE CHEST 1 VIEW COMPARISON:  01/08/2007 FINDINGS: The cardiac silhouette, mediastinal and hilar contours are within normal limits. The lungs are clear. No pleural effusion. The bony thorax is intact. IMPRESSION: No acute cardiopulmonary findings. Electronically Signed   By: Rudie Meyer M.D.   On: 05/29/2020 07:37   ECHOCARDIOGRAM COMPLETE BUBBLE STUDY  Result Date: 05/30/2020    ECHOCARDIOGRAM REPORT   Patient Name:   ADRA SHEPLER Date of Exam: 05/30/2020 Medical Rec #:  [id number]   Height:       60.0 in Accession #:    [id number]  Weight:       152.6 lb Date of Birth:  30-Aug-1961  BSA:          1.664 m Patient Age:    58 years    BP:           155/74 mmHg Patient Gender: F           HR:           98 bpm. Exam Location:  [facility] Procedure: 2D Echo, Cardiac Doppler, Color Doppler and Saline Contrast Bubble            Study Indications:     Stroke 434.91  History:         Patient has no prior history of Echocardiogram examinations.                  COPD, Signs/Symptoms:Shortness of Breath and Dyspnea; Risk                  Factors:Hypertension and Diabetes.  Sonographer:     Cristela Blue RDCS (AE) Referring Phys:  [id number] LESLIE REYNOLDS Diagnosing Phys: Arnoldo Hooker MD  Sonographer Comments: Image acquisition challenging due to COPD. IMPRESSIONS  1. Left ventricular ejection fraction, by estimation, is 50 to 55%. The left ventricle has low normal function. The left ventricle has no regional wall motion abnormalities. Left ventricular diastolic parameters were normal.  2. Right ventricular systolic function is normal. The right ventricular size is normal. There is normal pulmonary artery systolic pressure.  3. The mitral valve is normal in structure. Trivial mitral valve regurgitation.  4. The aortic valve is normal in structure. Aortic valve regurgitation is trivial.  5. Evidence of atrial level shunting detected by color flow  Doppler. Agitated saline contrast bubble study was positive with shunting observed within 3-6 cardiac cycles suggestive of interatrial shunt. There is a small patent foramen ovale with bidirectional shunting across atrial septum. FINDINGS  Left Ventricle: Left ventricular ejection fraction, by estimation, is 50 to 55%. The left ventricle has low normal function. The left ventricle has no regional wall motion abnormalities. The left ventricular internal cavity size was normal in size. There is no left ventricular hypertrophy. Left ventricular diastolic parameters were normal. Right Ventricle: The right ventricular size is normal. No increase in right ventricular wall thickness. Right ventricular systolic function is normal. There is normal pulmonary artery systolic pressure. The tricuspid regurgitant velocity is 1.87 m/s, and  with an assumed right atrial pressure of 10 mmHg, the estimated right ventricular systolic pressure is 24.0 mmHg. Left Atrium: Left atrial size was normal in  size. Right Atrium: Right atrial size was normal in size. Pericardium: There is no evidence of pericardial effusion. Mitral Valve: The mitral valve is normal in structure. Trivial mitral valve regurgitation. Tricuspid Valve: The tricuspid valve is normal in structure. Tricuspid valve regurgitation is trivial. Aortic Valve: The aortic valve is normal in structure. Aortic valve regurgitation is trivial. Aortic valve mean gradient measures 5.5 mmHg. Aortic valve peak gradient measures 9.6 mmHg. Aortic valve area, by VTI measures 1.66 cm. Pulmonic Valve: The pulmonic valve was normal in structure. Pulmonic valve regurgitation is not visualized. Aorta: The aortic root and ascending aorta are structurally normal, with no evidence of dilitation. IAS/Shunts: Evidence of atrial level shunting detected by color flow Doppler. Agitated saline contrast was given intravenously to evaluate for intracardiac shunting. Agitated saline contrast bubble study  was positive with shunting observed within 3-6 cardiac cycles suggestive of interatrial shunt. A small patent foramen ovale is detected with bidirectional shunting across atrial septum.  LEFT VENTRICLE PLAX 2D LVIDd:         3.20 cm  Diastology LVIDs:         2.32 cm  LV e' lateral:   10.30 cm/s LV PW:         0.96 cm  LV E/e' lateral: 6.4 LV IVS:        0.66 cm  LV e' medial:    6.31 cm/s LVOT diam:     2.00 cm  LV E/e' medial:  10.5 LV SV:         42 LV SV Index:   25 LVOT Area:     3.14 cm  RIGHT VENTRICLE RV Basal diam:  2.60 cm RV S prime:     19.10 cm/s TAPSE (M-mode): 3.0 cm LEFT ATRIUM             Index       RIGHT ATRIUM          Index LA diam:        2.10 cm 1.26 cm/m  RA Area:     7.57 cm LA Vol (A2C):   23.9 ml 14.37 ml/m RA Volume:   12.90 ml 7.75 ml/m LA Vol (A4C):   13.8 ml 8.29 ml/m LA Biplane Vol: 19.2 ml 11.54 ml/m  AORTIC VALVE AV Area (Vmax):    1.69 cm AV Area (Vmean):   1.75 cm AV Area (VTI):     1.66 cm AV Vmax:           155.00 cm/s AV Vmean:          106.500 cm/s AV VTI:            0.254 m AV Peak Grad:      9.6 mmHg AV Mean Grad:      5.5 mmHg LVOT Vmax:         83.60 cm/s LVOT Vmean:        59.400 cm/s LVOT VTI:          0.134 m LVOT/AV VTI ratio: 0.53  AORTA Ao Root diam: 2.50 cm MITRAL VALVE               TRICUSPID VALVE MV Area (PHT): 3.34 cm    TR Peak grad:   14.0 mmHg MV Decel Time: 227 msec    TR Vmax:        187.00 cm/s MV E velocity: 66.00 cm/s MV A velocity: 79.30 cm/s  SHUNTS MV E/A ratio:  0.83  Systemic VTI:  0.13 m                            Systemic Diam: 2.00 cm Arnoldo Hooker MD Electronically signed by Arnoldo Hooker MD Signature Date/Time: 05/30/2020/12:54:15 PM    Final    CT HEAD CODE STROKE WO CONTRAST  Result Date: 05/28/2020 CLINICAL DATA:  Code stroke.  Right vision loss EXAM: CT HEAD WITHOUT CONTRAST TECHNIQUE: Contiguous axial images were obtained from the base of the skull through the vertex without intravenous contrast. COMPARISON:  None.  FINDINGS: Brain: No acute intracranial hemorrhage, mass effect, or edema. Gray-white differentiation is preserved. There is no extra-axial fluid collection. Ventricles and sulci are normal in size and configuration. Vascular: No hyperdense vessel. Minor intracranial atherosclerotic calcification at the skull base. Skull: Unremarkable. Sinuses/Orbits: Visualized orbits are unremarkable. Trace paranasal sinus mucosal thickening. Other: Mastoid air cells are clear. ASPECTS (Alberta Stroke Program Early CT Score) - Ganglionic level infarction (caudate, lentiform nuclei, internal capsule, insula, M1-M3 cortex): 7 - Supraganglionic infarction (M4-M6 cortex): 3 Total score (0-10 with 10 being normal): 10 IMPRESSION: No acute intracranial hemorrhage or evidence of acute infarction. ASPECT score is 10. These results were called by telephone at the time of interpretation on 05/28/2020 at 4:24 pm to provider Shaune Pollack , who verbally acknowledged these results. Electronically Signed   By: Guadlupe Spanish M.D.   On: 05/28/2020 16:24   CT ANGIO HEAD CODE STROKE  Result Date: 05/28/2020 CLINICAL DATA:  Code stroke follow-up, right vision loss EXAM: CT ANGIOGRAPHY HEAD AND NECK TECHNIQUE: Multidetector CT imaging of the head and neck was performed using the standard protocol during bolus administration of intravenous contrast. Multiplanar CT image reconstructions and MIPs were obtained to evaluate the vascular anatomy. Carotid stenosis measurements (when applicable) are obtained utilizing NASCET criteria, using the distal internal carotid diameter as the denominator. CONTRAST:  94mL OMNIPAQUE IOHEXOL 350 MG/ML SOLN COMPARISON:  None. FINDINGS: CTA NECK Aortic arch: Great vessel origins are patent. Right carotid system: Patent. Mild calcified plaque at the ICA origin causing minimal stenosis. Left carotid system: Patent. Mild calcified plaque at the ICA origin causing minimal stenosis. Vertebral arteries: Patent and  codominant. Skeleton: Degenerative changes of the cervical spine. Other neck: No mass or adenopathy. Upper chest: No apical lung mass. Review of the MIP images confirms the above findings CTA HEAD Anterior circulation: Intracranial internal carotid arteries are patent with mild calcified plaque. Anterior and middle cerebral arteries are patent. Posterior circulation: Intracranial vertebral arteries, basilar artery, and posterior cerebral arteries are patent. Moderate to marked short segment stenosis of the proximal left P2 PCA. There is a right posterior communicating artery present. Venous sinuses: Patent as allowed by contrast bolus timing. Review of the MIP images confirms the above findings IMPRESSION: No large vessel occlusion or evidence of dissection. No hemodynamically significant stenosis in the neck. Moderate to marked short segment stenosis of the proximal left P2 PCA. Electronically Signed   By: Guadlupe Spanish M.D.   On: 05/28/2020 18:14   CT ANGIO NECK CODE STROKE  Result Date: 05/28/2020 CLINICAL DATA:  Code stroke follow-up, right vision loss EXAM: CT ANGIOGRAPHY HEAD AND NECK TECHNIQUE: Multidetector CT imaging of the head and neck was performed using the standard protocol during bolus administration of intravenous contrast. Multiplanar CT image reconstructions and MIPs were obtained to evaluate the vascular anatomy. Carotid stenosis measurements (when applicable) are obtained utilizing NASCET criteria, using the distal internal carotid  diameter as the denominator. CONTRAST:  75mL OMNIPAQUE IOHEXOL 350 MG/ML SOLN COMPARISON:  None. FINDINGS: CTA NECK Aortic arch: Great vessel origins are patent. Right carotid system: Patent. Mild calcified plaque at the ICA origin causing minimal stenosis. Left carotid system: Patent. Mild calcified plaque at the ICA origin causing minimal stenosis. Vertebral arteries: Patent and codominant. Skeleton: Degenerative changes of the cervical spine. Other neck: No  mass or adenopathy. Upper chest: No apical lung mass. Review of the MIP images confirms the above findings CTA HEAD Anterior circulation: Intracranial internal carotid arteries are patent with mild calcified plaque. Anterior and middle cerebral arteries are patent. Posterior circulation: Intracranial vertebral arteries, basilar artery, and posterior cerebral arteries are patent. Moderate to marked short segment stenosis of the proximal left P2 PCA. There is a right posterior communicating artery present. Venous sinuses: Patent as allowed by contrast bolus timing. Review of the MIP images confirms the above findings IMPRESSION: No large vessel occlusion or evidence of dissection. No hemodynamically significant stenosis in the neck. Moderate to marked short segment stenosis of the proximal left P2 PCA. Electronically Signed   By: Guadlupe Spanish M.D.   On: 05/28/2020 18:14     Assessment and Recommendation  59 y.o. female with hypertension hyperlipidemia diabetes having sleep apnea as well with a recent medial occipital lobe stroke of unknown etiology now significantly improved with TPA and no evidence of congestive heart failure and/or myocardial infarction now with a transesophageal echocardiogram showing possible source of patent foramen ovale with atrial septal aneurysm and further need for analyzing Linq device or loop monitor to assess for atrial fibrillation 1.  Continue aspirin for further risk reduction in stroke from a thrombotic source 2.  Continue risk factor management including high intensity cholesterol therapy hypertension control and diabetes therapy as before 3.  No further cardiac diagnostics necessary at this time 4.  Continue to monitor loop for atrial fibrillation as a source of stroke and treatment thereof is necessary with anticoagulation as needed as an outpatient 5.  Patient is okay for discharge home from cardiac standpoint with follow-up in the next 2 weeks for further evaluation  and treatment options as per above  Signed, Arnoldo Hooker M.D. FACC

## 2020-06-01 NOTE — CV Procedure (Signed)
Transesophageal echocardiogram preliminary report  Kerri Steele [id number] Jul 18, 1961  Preliminary diagnosis  Stroke with possible embolic source  Postprocedural diagnosis  Stroke with possible embolic source including PFO and atrial septal aneurysm  Time out A timeout was performed by the nursing staff and physicians specifically identifying the procedure performed, identification of the patient, the type of sedation, all allergies and medications, all pertinent medical history, and presedation assessment of nasopharynx. The patient and or family understand the risks of the procedure including the rare risks of death, stroke, heart attack, esophogeal perforation, sore throat, and reaction to medications given.  Moderate sedation During this procedure the patient has received Versed 3 milligrams and fentanyl 75 micrograms to achieve appropriate moderate sedation.  The patient had continued monitoring of heart rate, oxygenation, blood pressure, respiratory rate, and extent of signs of sedation throughout the entire procedure.  The patient received this moderate sedation over a period of 19 minutes.  Both the nursing staff and I were present during the procedure when the patient had moderate sedation for 100% of the time.  Treatment considerations  Antiplatelet therapy for patent foramen ovale and atrial septal aneurysm  For further details of transesophageal echocardiogram please refer to final report.  Signed,  Lamar Blinks M.D. [facility] 06/01/2020 1:41 PM

## 2020-06-01 NOTE — Progress Notes (Signed)
PROGRESS NOTE    Kerri Steele  NWG:[id number] DOB: [birth year] DOA: 05/28/2020 PCP: [name], Lindsey F, NP    Brief Narrative:  Kerri Steele 59 y.o.femalewith past medical history remarkable for essential HTN, HLD, COPD, DMpresented who presented to the ED with stroke symptoms, last known normal at 2pm that day. Symptoms included decreased vision, numbness of the right side of the tongue with facial droop, and right-sided upper and lower extremity weakness. Symptoms persisted. In the ED, was evaluated by teleneurology. Ultimately was treated with tPA and subsequently admitted to ICU for monitoring. Patient has remained stable.  TRH consulted for further evaluation and treatment.   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  Teleneurology was consulted and patient received TPA.  T head with no acute intracranial hemorrhage or evidence of acute infarction.  MR brain without contrast notable for acute infarct involving the medial left occipital lobe.  CT angio head with no large vessel occlusion or evidence of dissection.  Right lower extremity ultrasound negative for DVT.  TTE with EF 50-55%, no regional wall motion abnormalities, but does note positive interatrial shunt with small patent foramen ovale.  Seen by PT/OT and speech therapy with no further recommendations and her symptoms have resolved and she is back at her normal baseline. --Neurology following, appreciate assistance --Continue aspirin 81 mg p.o. daily, atorvastatin 40 mg p.o. daily --Cardiology considering placement of  loop recorder today --Continue to monitor on telemetry.  Patent foramen ovale TTE notable for interatrial shunt with small patent foramen ovale.  Patient [facility]ath[facility]. --Cardiology following, appreciate assistance --Pending TEE today --Await further cardiology recommendations  Essential hypertension On verapamil 120 mg p.o. daily at home. --Hold home verapamil for borderline hypotension this morning --Continue aspirin and statin --To monitor blood pressure closely  Type 2 diabetes mellitus Hemoglobin A1c 5.9, well controlled.  On glipizide 10 mg p.o. daily at home. --Hold oral hypoglycemics while inpatient --Insulin sliding scale for coverage --CBGs before every meal/at bedtime  COPD, not in acute exacerbation --Continue Spiriva and Brovana --Duo nebs as needed  Hyperlipidemia Total cholesterol 155, HDL 50, LDL 86. --Continue atorvastatin 40 mg p.o. daily  Chronic pain syndrome: Norco q6h prn  GERD: Continue PPI   DVT prophylaxis: SCDs Code Status: Full code Family Communication: Updated patient's husband who is present at bedside  Disposition Plan:  Status is: Inpatient  Remains inpatient appropriate because:Ongoing diagnostic testing needed not appropriate for outpatient work up, Unsafe d/c plan and Inpatient level of care appropriate due to severity of illness   Dispo: The patient is from: Home              Anticipated d/c is to: Home              Anticipated d/c date is: 1 day              Patient currently is not medically stable to d/c.        Consultants:   Neurology, Dr. Thad Rangereynolds  Cardiology, Dr. Gwen PoundsKowalski  Procedures:  Transthoracic echocardiogram 05/30/2020: IMPRESSIONS  1. Left ventricular ejection fraction, by estimation, is 50 to 55%. The  left ventricle has low normal function. The left ventricle has no regional  wall motion abnormalities. Left ventricular diastolic parameters were  normal.   2. Right ventricular systolic function is normal. The right ventricular  size is normal. There is normal pulmonary artery systolic pressure.  3. The mitral valve is normal in structure. Trivial mitral valve  regurgitation.  4. The aortic valve is normal in structure. Aortic valve regurgitation is  trivial.  5. Evidence of atrial level shunting detected by color flow Doppler.  Agitated saline contrast bubble study was positive with shunting observed  within 3-6 cardiac cycles suggestive of interatrial shunt. There is a  small patent foramen ovale with  bidirectional shunting across atrial septum.   Ultrasound venous duplex bilateral lower extremities: IMPRESSION: No evidence of DVT within either lower extremity.  Antimicrobials:   None   Subjective: Patient seen and examined at bedside, resting comfortably.  Husband present.  Awaiting TEE later this morning with consideration of possible loop device recorder placement by cardiology.  Patient with slight anxiousness.  Weakness continues to be resolved.  No other questions or concerns at this time.  Denies headache, no fever/chills/night sweats, no nausea/vomiting/diarrhea, no chest pain, no palpitations, no shortness of breath, no abdominal pain.  No acute events overnight per nursing staff.  Objective: Vitals:   06/01/20 0023 06/01/20 0413 06/01/20 0750 06/01/20 1103  BP: 131/85 126/66 139/83 (!) 142/111  Pulse: (!) 101 75 70   Resp: 14 16 17  (!) 33  Temp: 98.1 F (36.7 C) 98 F (36.7 C) 98.6 F (37 C)   TempSrc: Oral Oral Oral   SpO2: 100% 100% 100% 100%  Weight:      Height:        Intake/Output Summary (Last 24 hours) at 06/01/2020 1107 Last data filed at 06/01/2020 0522 Gross per 24 hour  Intake --  Output 0 ml  Net 0 ml   Filed Weights   05/29/20 0500 05/30/20 0500 05/31/20 0500  Weight: 69.1 kg 69.2 kg 69.1 kg    Examination:  General exam: Appears calm and comfortable; slightly anxious at  times Respiratory system: Clear to auscultation. Respiratory effort normal. Cardiovascular system: S1 & S2 heard, RRR. No JVD, murmurs, rubs, gallops or clicks. No pedal edema. Gastrointestinal system: Abdomen is nondistended, soft and nontender. No organomegaly or masses felt. Normal bowel sounds heard. Central nervous system: Alert and oriented. No focal neurological deficits. Extremities: Symmetric 5 x 5 power. Skin: No rashes, lesions or ulcers Psychiatry: Judgement and insight appear normal. Mood & affect appropriate.     Data Reviewed: I have personally reviewed following labs and imaging studies  CBC: Recent Labs  Lab 05/28/20 1625 05/29/20 0439 05/30/20 0602  WBC 11.1* 7.7 10.0  NEUTROABS 6.4  --   --   HGB 12.7 11.6* 12.7  HCT 41.1 38.1 41.2  MCV 79.0* 79.2* 78.0*  PLT 347 299 330   Basic Metabolic Panel: Recent Labs  Lab 05/28/20 1625 05/29/20 0439 05/30/20 0602  NA 140 143 144  K 3.4* 3.1* 3.5  CL 104 107 107  CO2 27 30 22   GLUCOSE 108* 79 111*  BUN 18 14 17   CREATININE 1.01* 0.99 0.80  CALCIUM 9.6 9.0 9.7  MG  --  2.3 2.4  PHOS  --  3.9 3.2   GFR: Estimated Creatinine Clearance: 67.2 mL/min (by C-G formula based on SCr of 0.8 mg/dL). Liver Function Tests:  Recent Labs  Lab 05/28/20 1625 05/30/20 0602  AST 30 23  ALT 27 22  ALKPHOS 94 80  BILITOT 0.6 0.7  PROT 7.4 7.4  ALBUMIN 4.3 4.0   No results for input(s): LIPASE, AMYLASE in the last 168 hours. No results for input(s): AMMONIA in the last 168 hours. Coagulation Profile: Recent Labs  Lab 05/28/20 1625  INR 0.9   Cardiac Enzymes: No results for input(s): CKTOTAL, CKMB, CKMBINDEX, TROPONINI in the last 168 hours. BNP (last 3 results) No results for input(s): PROBNP in the last 8760 hours. HbA1C: Recent Labs    05/30/20 0602  HGBA1C 5.9*   CBG: Recent Labs  Lab 05/28/20 1620 05/28/20 1954  GLUCAP 97 128*   Lipid Profile: Recent Labs    05/30/20 0602  CHOL 155  HDL 50   LDLCALC 86  TRIG 97  CHOLHDL 3.1   Thyroid Function Tests: No results for input(s): TSH, T4TOTAL, FREET4, T3FREE, THYROIDAB in the last 72 hours. Anemia Panel: No results for input(s): VITAMINB12, FOLATE, FERRITIN, TIBC, IRON, RETICCTPCT in the last 72 hours. Sepsis Labs: No results for input(s): PROCALCITON, LATICACIDVEN in the last 168 hours.  Recent Results (from the past 240 hour(s))  SARS Coronavirus 2 by RT PCR (hospital order, performed in [facility] hospital lab) Nasopharyngeal Nasopharyngeal Swab     Status: None   Collection Time: 05/28/20  6:15 PM   Specimen: Nasopharyngeal Swab  Result Value Ref Range Status   SARS Coronavirus 2 NEGATIVE NEGATIVE Final    Comment: (NOTE) SARS-CoV-2 target nucleic acids are NOT DETECTED.  The SARS-CoV-2 RNA is generally detectable in upper and lower respiratory specimens during the acute phase of infection. The lowest concentration of SARS-CoV-2 viral copies this assay can detect is 250 copies / mL. A negative result does not preclude SARS-CoV-2 infection and should not be used as the sole basis for treatment or other patient management decisions.  A negative result may occur with improper specimen collection / handling, submission of specimen other than nasopharyngeal swab, presence of viral mutation(s) within the areas targeted by this assay, and inadequate number of viral copies (<250 copies / mL). A negative result must be combined with clinical observations, patient history, and epidemiological information.  Fact Sheet for Patients:   BoilerBrush.com.cy  Fact Sheet for Healthcare Providers: https://pope.com/  This test is not yet approved or  cleared by the [place] FDA and has been authorized for detection and/or diagnosis of SARS-CoV-2 by FDA under an Emergency Use Authorization (EUA).  This EUA will remain in effect (meaning this test can be used) for the duration of  the COVID-19 declaration under Section 564(b)(1) of the Act, 21 U.S.C. section 360bbb-3(b)(1), unless the authorization is terminated or revoked sooner.  Performed at [facility], [address] [place], [place] [address]   MRSA PCR Screening     Status: None   Collection Time: 05/28/20  7:55 PM   Specimen: Nasal Mucosa; Nasopharyngeal  Result Value Ref Range Status   MRSA by PCR NEGATIVE NEGATIVE Final    Comment:        The GeneXpert MRSA Assay (FDA approved for NASAL specimens only), is one component of a comprehensive MRSA colonization surveillance program. It is not intended to diagnose MRSA infection nor to guide or monitor treatment for MRSA infections. Performed at [facility], [address] [place], [place] [address]   Surgical PCR screen     Status: Abnormal   Collection Time: 06/01/20  8:08  AM   Specimen: Nasal Mucosa; Nasal Swab  Result Value Ref Range Status   MRSA, PCR NEGATIVE NEGATIVE Final   Staphylococcus aureus POSITIVE (A) NEGATIVE Final    Comment: (NOTE) The Xpert SA Assay (FDA approved for NASAL specimens in patients 67 years of age and older), is one component of a comprehensive surveillance program. It is not intended to diagnose infection nor to guide or monitor treatment. Performed at [facility], [address], [place], [place] [address]          Radiology Studies: MR BRAIN WO CONTRAST  Result Date: 05/30/2020 CLINICAL DATA:  Stroke, follow-up. EXAM: MRI HEAD WITHOUT CONTRAST TECHNIQUE: Multiplanar, multiecho pulse sequences of the brain and surrounding structures were obtained without intravenous contrast. COMPARISON:  05/28/2020 noncontrast head CT and CTA head and neck. FINDINGS: Brain: Focal restricted diffusion involving the medial left occipital lobe with associated T2/FLAIR hyperintense signal (5:27) and mild gyral edema. Additional punctate foci of restricted diffusion are seen inferiorly within  the medial left occipital lobe (5:21). No intracranial hemorrhage. No midline shift, ventriculomegaly or extra-axial fluid collection. No mass lesion. Vascular: Intracranial vessels are better evaluated on recent CTA head. Skull and upper cervical spine: Normal marrow signal. Sinuses/Orbits: Normal orbits. Clear paranasal sinuses. Trace right and small left mastoid effusions. Other: None. 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 These results were called by telephone at the time of interpretation on 05/30/2020 at 12:28 pm to provider [facility] , who verbally acknowledged these results. Electronically Signed   By: Stana Bunting M.D.   On: 05/30/2020 12:39   US Venous Img Lower Bilateral (DVT)  Result Date: 05/30/2020 CLINICAL DATA:  Bilateral lower extremity pain for the past 2 days. Evaluate for DVT. EXAM: BILATERAL LOWER EXTREMITY VENOUS DOPPLER ULTRASOUND TECHNIQUE: Gray-scale sonography with graded compression, as well as color Doppler and duplex ultrasound were performed to evaluate the lower extremity deep venous systems from the level of the common femoral vein and including the common femoral, femoral, profunda femoral, popliteal and calf veins including the posterior tibial, peroneal and gastrocnemius veins when visible. The superficial great saphenous vein was also interrogated. Spectral Doppler was utilized to evaluate flow at rest and with distal augmentation maneuvers in the common femoral, femoral and popliteal veins. COMPARISON:  None. FINDINGS: RIGHT LOWER EXTREMITY Common Femoral Vein: No evidence of thrombus. Normal compressibility, respiratory phasicity and response to augmentation. Saphenofemoral Junction: No evidence of thrombus. Normal compressibility and flow on color Doppler imaging. Profunda Femoral Vein: No evidence of thrombus. Normal compressibility and flow on color Doppler imaging. Femoral Vein: No evidence of thrombus. Normal compressibility,  respiratory phasicity and response to augmentation. Popliteal Vein: No evidence of thrombus. Normal compressibility, respiratory phasicity and response to augmentation. Calf Veins: No evidence of thrombus. Normal compressibility and flow on color Doppler imaging. Superficial Great Saphenous Vein: No evidence of thrombus. Normal compressibility. Venous Reflux:  None. Other Findings:  None. LEFT LOWER EXTREMITY Common Femoral Vein: No evidence of thrombus. Normal compressibility, respiratory phasicity and response to augmentation. Saphenofemoral Junction: No evidence of thrombus. Normal compressibility and flow on color Doppler imaging. Profunda Femoral Vein: No evidence of thrombus. Normal compressibility and flow on color Doppler imaging. Femoral Vein: No evidence of thrombus. Normal compressibility, respiratory phasicity and response to augmentation. Popliteal Vein: No evidence of thrombus. Normal compressibility, respiratory phasicity and response to augmentation. Calf Veins: No evidence of thrombus. Normal compressibility and flow on color Doppler imaging. Superficial Great Saphenous Vein: No evidence of  thrombus. Normal compressibility. Venous Reflux:  None. Other Findings:  None. IMPRESSION: No evidence of DVT within either lower extremity. Electronically Signed   By: Simonne Come M.D.   On: 05/30/2020 17:03        Scheduled Meds: . [MAR Hold] aspirin EC  81 mg Oral Daily  . [MAR Hold] atorvastatin  40 mg Oral Daily  . [MAR Hold] Chlorhexidine Gluconate Cloth  6 each Topical Daily  . [MAR Hold] DULoxetine  60 mg Oral Daily  . [MAR Hold] pantoprazole  40 mg Oral Daily  . [MAR Hold] sodium chloride flush  3 mL Intravenous Once  . [MAR Hold] sodium chloride flush  3 mL Intravenous Q12H  . [MAR Hold] tiotropium  18 mcg Inhalation Daily   Continuous Infusions: . [MAR Hold] sodium chloride    . sodium chloride    . sodium chloride    .  ceFAZolin (ANCEF) IV       LOS: 4 days    Time spent:  34 minutes spent on chart review, discussion with nursing staff, consultants, updating family and interview/physical exam; more than 50% of that time was spent in counseling and/or coordination of care.    Alvira Philips [place], DO [facility] Available via Epic secure chat 7am-7pm After these hours, please refer to coverage provider listed on [email or web address] 06/01/2020, 11:07 AM

## 2020-06-01 NOTE — Discharge Summary (Signed)
Physician Discharge Summary  [name] BPZ:[id number] DOB: Aug 03, 1961 DOA: 05/28/2020  PCP: Erasmo Downer, NP  Admit date: 05/28/2020 Discharge date: 06/01/2020  Admitted From: Home Disposition: Home  Recommendations for Outpatient Follow-up:  1. Follow up with PCP in 1-2 weeks 2. Follow-up with cardiology, Dr. Lady Gary in 2 weeks 3. Needs to establish care with outpatient neurology following acute stroke 4. Start on aspirin 81 mg p.o. daily 5. Increased atorvastatin to 40 mg p.o. daily  Home Health: No Equipment/Devices: None  Discharge Condition: Stable CODE STATUS: Full code Diet recommendation: Heart healthy/consistent carbohydrate diet  History of present illness:  Kerri Steele 59 y.o.femalewith past medical history remarkable for essential HTN, HLD, COPD, DMpresented who presented to the ED with stroke symptoms, last known normal at 2pm that day. Symptoms included decreased vision, numbness of the right side of the tongue with facial droop, and right-sided upper and lower extremity weakness. Symptoms persisted. In the ED, was evaluated by teleneurology. Ultimately was treated with tPA and subsequently admitted to ICU for monitoring. Patient has remained stable.  TRH consulted for further evaluation and treatment.  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  Neurology was consulted and patient received TPA.  T head with no acute intracranial hemorrhage or evidence of acute infarction.  MR brain without contrast notable for acute infarct involving the medial left occipital lobe.  CT angio head with no large vessel occlusion or evidence of dissection.  Right lower extremity ultrasound negative for DVT.  TTE with EF 50-55%, no regional wall motion abnormalities, but does note positive interatrial shunt with small patent foramen ovale.  Seen by PT/OT and  speech therapy with no further recommendations and her symptoms have resolved and she is back at her normal baseline.  Started on aspirin 81 mg p.o. daily and atorvastatin increased to 40 mg p.o. daily.  Seen by cardiology underwent TEE and placement of loop recorder.  Needs outpatient follow-up with neurology and cardiology following discharge.  Patent foramen ovale TTE notable for interatrial shunt with small patent foramen ovale.  Patient follows with Dr. Lady Gary [facility] cardiology outpatient.  Underwent TEE during hospitalization.  Cardiology recommends outpatient follow-up 2 weeks.  Essential hypertension Continue home verapamil 120 mg p.o. daily.  Continue aspirin and statin  Type 2 diabetes mellitus Hemoglobin A1c 5.9, well controlled.    Continue home glipizide 10 mg p.o. daily  COPD, not in acute exacerbation Continue Spiriva and Brovana  Hyperlipidemia Total cholesterol 155, HDL 50, LDL 86.  Atorvastatin increased to 40 mg p.o. daily for high intensity for stroke as above.  Chronic pain syndrome: Norco q6h prn  GERD: Continue PPI  Discharge Diagnoses:  Principal Problem:   Stroke [facility]) Active Problems:   PFO (patent foramen ovale)   Benign essential HTN   Type 2 diabetes mellitus with hyperlipidemia (HCC)   COPD without exacerbation (HCC)   GERD (gastroesophageal reflux disease)   HLD (hyperlipidemia)   Chronic pain disorder    Discharge Instructions  Discharge Instructions    Call MD for:  difficulty breathing, headache or visual disturbances   Complete by: As directed    Call MD for:  extreme fatigue   Complete by: As directed    Call MD for:  persistant dizziness or light-headedness   Complete by: As directed    Call MD for:  persistant nausea and vomiting   Complete by: As directed  Call MD for:  redness, tenderness, or signs of infection (pain, swelling, redness, odor or green/yellow discharge around incision site)   Complete by: As directed     Call MD for:  severe uncontrolled pain   Complete by: As directed    Call MD for:  temperature >100.4   Complete by: As directed    Diet - low sodium heart healthy   Complete by: As directed    Increase activity slowly   Complete by: As directed      Allergies as of 06/01/2020      Reactions   Metformin And Related Other (See Comments)   migraine   Morphine And Related Hives   Other Other (See Comments)   Latex allergy   Sulfa Antibiotics Other (See Comments)      Medication List    TAKE these medications   arformoterol 15 MCG/2ML Nebu Commonly known as: BROVANA Take 15 mcg by nebulization 2 (two) times daily.   aspirin 81 MG EC tablet Take 1 tablet (81 mg total) by mouth daily. Swallow whole.   atorvastatin 40 MG tablet Commonly known as: LIPITOR Take 1 tablet (40 mg total) by mouth daily. What changed:   medication strength  how much to take   Combivent Respimat 20-100 MCG/ACT Aers respimat Generic drug: Ipratropium-Albuterol Inhale 1 puff into the lungs every 6 (six) hours.   DULoxetine 60 MG capsule Commonly known as: CYMBALTA Take 60 mg by mouth daily.   glipiZIDE 10 MG 24 hr tablet Commonly known as: GLUCOTROL XL Take 10 mg by mouth daily with breakfast.   HYDROcodone-acetaminophen 5-325 MG tablet Commonly known as: Norco Take 1 tablet by mouth every 6 (six) hours as needed for severe pain.   magnesium oxide 400 MG tablet Commonly known as: MAG-OX Take 400 mg by mouth 2 (two) times daily.   methocarbamol 750 MG tablet Commonly known as: ROBAXIN Take 750 mg by mouth 3 (three) times daily.   omeprazole 20 MG capsule Commonly known as: PRILOSEC Take 20 mg by mouth daily.   pregabalin 150 MG capsule Commonly known as: LYRICA Take 150 mg by mouth 2 (two) times daily.   sucralfate 1 g tablet Commonly known as: Carafate Take 1 tablet (1 g total) by mouth 4 (four) times daily.   tiotropium 18 MCG inhalation capsule Commonly known as:  SPIRIVA Place 18 mcg into inhaler and inhale daily.   traMADol 50 MG tablet Commonly known as: ULTRAM Take 50 mg by mouth every 6 (six) hours as needed for moderate pain.   verapamil 120 MG CR tablet Commonly known as: CALAN-SR Take 120 mg by mouth daily.       Follow-up Information    Erasmo Downer, NP. Schedule an appointment as soon as possible for a visit in 1 week(s).   Specialty: Nurse Practitioner Contact information: [address] [place] [address] [phone]        Dalia Heading, MD. Schedule an appointment as soon as possible for a visit in 2 week(s).   Specialty: Cardiology Contact information: [address] [place] [address] [phone]              Allergies  Allergen Reactions  . Metformin And Related Other (See Comments)    migraine  . Morphine And Related Hives  . Other Other (See Comments)    Latex allergy  . Sulfa Antibiotics Other (See Comments)    Consultations:  Neurology, Dr. Thad Ranger  Cardiology, Dr. Gwen Pounds  Procedures/Studies:  MR BRAIN WO CONTRAST  Result Date: 05/30/2020 CLINICAL DATA:  Stroke, follow-up. EXAM: MRI HEAD WITHOUT CONTRAST TECHNIQUE: Multiplanar, multiecho pulse sequences of the brain and surrounding structures were obtained without intravenous contrast. COMPARISON:  05/28/2020 noncontrast head CT and CTA head and neck. FINDINGS: Brain: Focal restricted diffusion involving the medial left occipital lobe with associated T2/FLAIR hyperintense signal (5:27) and mild gyral edema. Additional punctate foci of restricted diffusion are seen inferiorly within the medial left occipital lobe (5:21). No intracranial hemorrhage. No midline shift, ventriculomegaly or extra-axial fluid collection. No mass lesion. Vascular: Intracranial vessels are better evaluated on recent CTA head. Skull and upper cervical spine: Normal marrow signal. Sinuses/Orbits: Normal orbits. Clear paranasal sinuses. Trace right and  small left mastoid effusions. Other: None. 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 These results were called by telephone at the time of interpretation on 05/30/2020 at 12:28 pm to provider [facility] , who verbally acknowledged these results. Electronically Signed   By: Stana Bunting M.D.   On: 05/30/2020 12:39   EP PPM/ICD IMPLANT  Result Date: 06/01/2020 Successful Reveal Linq loop monitor implantation  US Venous Img Lower Bilateral (DVT)  Result Date: 05/30/2020 CLINICAL DATA:  Bilateral lower extremity pain for the past 2 days. Evaluate for DVT. EXAM: BILATERAL LOWER EXTREMITY VENOUS DOPPLER ULTRASOUND TECHNIQUE: Gray-scale sonography with graded compression, as well as color Doppler and duplex ultrasound were performed to evaluate the lower extremity deep venous systems from the level of the common femoral vein and including the common femoral, femoral, profunda femoral, popliteal and calf veins including the posterior tibial, peroneal and gastrocnemius veins when visible. The superficial great saphenous vein was also interrogated. Spectral Doppler was utilized to evaluate flow at rest and with distal augmentation maneuvers in the common femoral, femoral and popliteal veins. COMPARISON:  None. FINDINGS: RIGHT LOWER EXTREMITY Common Femoral Vein: No evidence of thrombus. Normal compressibility, respiratory phasicity and response to augmentation. Saphenofemoral Junction: No evidence of thrombus. Normal compressibility and flow on color Doppler imaging. Profunda Femoral Vein: No evidence of thrombus. Normal compressibility and flow on color Doppler imaging. Femoral Vein: No evidence of thrombus. Normal compressibility, respiratory phasicity and response to augmentation. Popliteal Vein: No evidence of thrombus. Normal compressibility, respiratory phasicity and response to augmentation. Calf Veins: No evidence of thrombus. Normal compressibility and flow on color  Doppler imaging. Superficial Great Saphenous Vein: No evidence of thrombus. Normal compressibility. Venous Reflux:  None. Other Findings:  None. LEFT LOWER EXTREMITY Common Femoral Vein: No evidence of thrombus. Normal compressibility, respiratory phasicity and response to augmentation. Saphenofemoral Junction: No evidence of thrombus. Normal compressibility and flow on color Doppler imaging. Profunda Femoral Vein: No evidence of thrombus. Normal compressibility and flow on color Doppler imaging. Femoral Vein: No evidence of thrombus. Normal compressibility, respiratory phasicity and response to augmentation. Popliteal Vein: No evidence of thrombus. Normal compressibility, respiratory phasicity and response to augmentation. Calf Veins: No evidence of thrombus. Normal compressibility and flow on color Doppler imaging. Superficial Great Saphenous Vein: No evidence of thrombus. Normal compressibility. Venous Reflux:  None. Other Findings:  None. IMPRESSION: No evidence of DVT within either lower extremity. Electronically Signed   By: Simonne Come M.D.   On: 05/30/2020 17:03   DG Chest Port 1 View  Result Date: 05/29/2020 CLINICAL DATA:  Acute CVA. EXAM: PORTABLE CHEST 1 VIEW COMPARISON:  01/08/2007 FINDINGS: The cardiac silhouette, mediastinal and hilar contours are within normal limits. The lungs are clear. No pleural effusion. The bony thorax is  intact. IMPRESSION: No acute cardiopulmonary findings. Electronically Signed   By: Rudie Meyer M.D.   On: 05/29/2020 07:37   ECHOCARDIOGRAM COMPLETE BUBBLE STUDY  Result Date: 05/30/2020    ECHOCARDIOGRAM REPORT   Patient Name:   MIRISSA LOPRESTI Date of Exam: 05/30/2020 Medical Rec #:  [id number]   Height:       60.0 in Accession #:    [id number]  Weight:       152.6 lb Date of Birth:  [birth year]  BSA:          1.664 m Patient Age:    58 years    BP:           155/74 mmHg Patient Gender: F           HR:           98 bpm. Exam Location:  [facility] Procedure: 2D Echo, Cardiac  Doppler, Color Doppler and Saline Contrast Bubble            Study Indications:     Stroke 434.91  History:         Patient has no prior history of Echocardiogram examinations.                  COPD, Signs/Symptoms:Shortness of Breath and Dyspnea; Risk                  Factors:Hypertension and Diabetes.  Sonographer:     Cristela Blue RDCS (AE) Referring Phys:  [id number] LESLIE REYNOLDS Diagnosing Phys: Arnoldo Hooker MD  Sonographer Comments: Image acquisition challenging due to COPD. IMPRESSIONS  1. Left ventricular ejection fraction, by estimation, is 50 to 55%. The left ventricle has low normal function. The left ventricle has no regional wall motion abnormalities. Left ventricular diastolic parameters were normal.  2. Right ventricular systolic function is normal. The right ventricular size is normal. There is normal pulmonary artery systolic pressure.  3. The mitral valve is normal in structure. Trivial mitral valve regurgitation.  4. The aortic valve is normal in structure. Aortic valve regurgitation is trivial.  5. Evidence of atrial level shunting detected by color flow Doppler. Agitated saline contrast bubble study was positive with shunting observed within 3-6 cardiac cycles suggestive of interatrial shunt. There is a small patent foramen ovale with bidirectional shunting across atrial septum. FINDINGS  Left Ventricle: Left ventricular ejection fraction, by estimation, is 50 to 55%. The left ventricle has low normal function. The left ventricle has no regional wall motion abnormalities. The left ventricular internal cavity size was normal in size. There is no left ventricular hypertrophy. Left ventricular diastolic parameters were normal. Right Ventricle: The right ventricular size is normal. No increase in right ventricular wall thickness. Right ventricular systolic function is normal. There is normal pulmonary artery systolic pressure. The tricuspid regurgitant velocity is 1.87 m/s, and  with an assumed right  atrial pressure of 10 mmHg, the estimated right ventricular systolic pressure is 24.0 mmHg. Left Atrium: Left atrial size was normal in size. Right Atrium: Right atrial size was normal in size. Pericardium: There is no evidence of pericardial effusion. Mitral Valve: The mitral valve is normal in structure. Trivial mitral valve regurgitation. Tricuspid Valve: The tricuspid valve is normal in structure. Tricuspid valve regurgitation is trivial. Aortic Valve: The aortic valve is normal in structure. Aortic valve regurgitation is trivial. Aortic valve mean gradient measures 5.5 mmHg. Aortic valve peak gradient measures 9.6 mmHg. Aortic valve area, by VTI measures 1.66 cm. Pulmonic Valve: The pulmonic  valve was normal in structure. Pulmonic valve regurgitation is not visualized. Aorta: The aortic root and ascending aorta are structurally normal, with no evidence of dilitation. IAS/Shunts: Evidence of atrial level shunting detected by color flow Doppler. Agitated saline contrast was given intravenously to evaluate for intracardiac shunting. Agitated saline contrast bubble study was positive with shunting observed within 3-6 cardiac cycles suggestive of interatrial shunt. A small patent foramen ovale is detected with bidirectional shunting across atrial septum.  LEFT VENTRICLE PLAX 2D LVIDd:         3.20 cm  Diastology LVIDs:         2.32 cm  LV e' lateral:   10.30 cm/s LV PW:         0.96 cm  LV E/e' lateral: 6.4 LV IVS:        0.66 cm  LV e' medial:    6.31 cm/s LVOT diam:     2.00 cm  LV E/e' medial:  10.5 LV SV:         42 LV SV Index:   25 LVOT Area:     3.14 cm  RIGHT VENTRICLE RV Basal diam:  2.60 cm RV S prime:     19.10 cm/s TAPSE (M-mode): 3.0 cm LEFT ATRIUM             Index       RIGHT ATRIUM          Index LA diam:        2.10 cm 1.26 cm/m  RA Area:     7.57 cm LA Vol (A2C):   23.9 ml 14.37 ml/m RA Volume:   12.90 ml 7.75 ml/m LA Vol (A4C):   13.8 ml 8.29 ml/m LA Biplane Vol: 19.2 ml 11.54 ml/m  AORTIC  VALVE AV Area (Vmax):    1.69 cm AV Area (Vmean):   1.75 cm AV Area (VTI):     1.66 cm AV Vmax:           155.00 cm/s AV Vmean:          106.500 cm/s AV VTI:            0.254 m AV Peak Grad:      9.6 mmHg AV Mean Grad:      5.5 mmHg LVOT Vmax:         83.60 cm/s LVOT Vmean:        59.400 cm/s LVOT VTI:          0.134 m LVOT/AV VTI ratio: 0.53  AORTA Ao Root diam: 2.50 cm MITRAL VALVE               TRICUSPID VALVE MV Area (PHT): 3.34 cm    TR Peak grad:   14.0 mmHg MV Decel Time: 227 msec    TR Vmax:        187.00 cm/s MV E velocity: 66.00 cm/s MV A velocity: 79.30 cm/s  SHUNTS MV E/A ratio:  0.83        Systemic VTI:  0.13 m                            Systemic Diam: 2.00 cm Arnoldo HookerBruce Kowalski MD Electronically signed by Arnoldo HookerBruce Kowalski MD Signature Date/Time: 05/30/2020/12:54:15 PM    Final    CT HEAD CODE STROKE WO CONTRAST  Result Date: 05/28/2020 CLINICAL DATA:  Code stroke.  Right vision loss EXAM: CT HEAD WITHOUT CONTRAST TECHNIQUE: Contiguous axial images were  obtained from the base of the skull through the vertex without intravenous contrast. COMPARISON:  None. FINDINGS: Brain: No acute intracranial hemorrhage, mass effect, or edema. Gray-white differentiation is preserved. There is no extra-axial fluid collection. Ventricles and sulci are normal in size and configuration. Vascular: No hyperdense vessel. Minor intracranial atherosclerotic calcification at the skull base. Skull: Unremarkable. Sinuses/Orbits: Visualized orbits are unremarkable. Trace paranasal sinus mucosal thickening. Other: Mastoid air cells are clear. ASPECTS (Alberta Stroke Program Early CT Score) - Ganglionic level infarction (caudate, lentiform nuclei, internal capsule, insula, M1-M3 cortex): 7 - Supraganglionic infarction (M4-M6 cortex): 3 Total score (0-10 with 10 being normal): 10 IMPRESSION: No acute intracranial hemorrhage or evidence of acute infarction. ASPECT score is 10. These results were called by telephone at the time of  interpretation on 05/28/2020 at 4:24 pm to provider Shaune Pollack , who verbally acknowledged these results. Electronically Signed   By: Guadlupe Spanish M.D.   On: 05/28/2020 16:24   CT ANGIO HEAD CODE STROKE  Result Date: 05/28/2020 CLINICAL DATA:  Code stroke follow-up, right vision loss EXAM: CT ANGIOGRAPHY HEAD AND NECK TECHNIQUE: Multidetector CT imaging of the head and neck was performed using the standard protocol during bolus administration of intravenous contrast. Multiplanar CT image reconstructions and MIPs were obtained to evaluate the vascular anatomy. Carotid stenosis measurements (when applicable) are obtained utilizing NASCET criteria, using the distal internal carotid diameter as the denominator. CONTRAST:  75mL OMNIPAQUE IOHEXOL 350 MG/ML SOLN COMPARISON:  None. FINDINGS: CTA NECK Aortic arch: Great vessel origins are patent. Right carotid system: Patent. Mild calcified plaque at the ICA origin causing minimal stenosis. Left carotid system: Patent. Mild calcified plaque at the ICA origin causing minimal stenosis. Vertebral arteries: Patent and codominant. Skeleton: Degenerative changes of the cervical spine. Other neck: No mass or adenopathy. Upper chest: No apical lung mass. Review of the MIP images confirms the above findings CTA HEAD Anterior circulation: Intracranial internal carotid arteries are patent with mild calcified plaque. Anterior and middle cerebral arteries are patent. Posterior circulation: Intracranial vertebral arteries, basilar artery, and posterior cerebral arteries are patent. Moderate to marked short segment stenosis of the proximal left P2 PCA. There is a right posterior communicating artery present. Venous sinuses: Patent as allowed by contrast bolus timing. Review of the MIP images confirms the above findings IMPRESSION: No large vessel occlusion or evidence of dissection. No hemodynamically significant stenosis in the neck. Moderate to marked short segment stenosis of  the proximal left P2 PCA. Electronically Signed   By: Guadlupe Spanish M.D.   On: 05/28/2020 18:14   CT ANGIO NECK CODE STROKE  Result Date: 05/28/2020 CLINICAL DATA:  Code stroke follow-up, right vision loss EXAM: CT ANGIOGRAPHY HEAD AND NECK TECHNIQUE: Multidetector CT imaging of the head and neck was performed using the standard protocol during bolus administration of intravenous contrast. Multiplanar CT image reconstructions and MIPs were obtained to evaluate the vascular anatomy. Carotid stenosis measurements (when applicable) are obtained utilizing NASCET criteria, using the distal internal carotid diameter as the denominator. CONTRAST:  75mL OMNIPAQUE IOHEXOL 350 MG/ML SOLN COMPARISON:  None. FINDINGS: CTA NECK Aortic arch: Great vessel origins are patent. Right carotid system: Patent. Mild calcified plaque at the ICA origin causing minimal stenosis. Left carotid system: Patent. Mild calcified plaque at the ICA origin causing minimal stenosis. Vertebral arteries: Patent and codominant. Skeleton: Degenerative changes of the cervical spine. Other neck: No mass or adenopathy. Upper chest: No apical lung mass. Review of the MIP images confirms the above  findings CTA HEAD Anterior circulation: Intracranial internal carotid arteries are patent with mild calcified plaque. Anterior and middle cerebral arteries are patent. Posterior circulation: Intracranial vertebral arteries, basilar artery, and posterior cerebral arteries are patent. Moderate to marked short segment stenosis of the proximal left P2 PCA. There is a right posterior communicating artery present. Venous sinuses: Patent as allowed by contrast bolus timing. Review of the MIP images confirms the above findings IMPRESSION: No large vessel occlusion or evidence of dissection. No hemodynamically significant stenosis in the neck. Moderate to marked short segment stenosis of the proximal left P2 PCA. Electronically Signed   By: Guadlupe Spanish M.D.   On:  05/28/2020 18:14      Subjective: Patient seen and examined bedside, following return from TEE and loop recorder implant.  Okay for discharge home per cardiology.  Patient without complaints and ready for discharge home.  Husband updated at bedside.  Denies headache, no fever/chills/night sweats, no nausea/vomiting/diarrhea, no chest pain, palpitations, no shortness of breath, no abdominal pain, no weakness, no fatigue, no paresthesias.  No acute events overnight per nursing staff  Discharge Exam: Vitals:   06/01/20 1250 06/01/20 1337  BP: (!) 129/77 (!) 143/73  Pulse: 91 77  Resp: 17 16  Temp:  98.4 F (36.9 C)  SpO2: 97% 99%   Vitals:   06/01/20 1240 06/01/20 1245 06/01/20 1250 06/01/20 1337  BP: (!) 136/86 128/85 (!) 129/77 (!) 143/73  Pulse: 98 93 91 77  Resp: 20 19 17 16   Temp:    98.4 F (36.9 C)  TempSrc:    Oral  SpO2: 99% 100% 97% 99%  Weight:      Height:        General: Pt is alert, awake, not in acute distress Cardiovascular: RRR, S1/S2 +, no rubs, no gallops Respiratory: CTA bilaterally, no wheezing, no rhonchi Abdominal: Soft, NT, ND, bowel sounds + Extremities: no edema, no cyanosis    The results of significant diagnostics from this hospitalization (including imaging, microbiology, ancillary and laboratory) are listed below for reference.     Microbiology: Recent Results (from the past 240 hour(s))  SARS Coronavirus 2 by RT PCR (hospital order, performed in [facility] hospital lab) Nasopharyngeal Nasopharyngeal Swab     Status: None   Collection Time: 05/28/20  6:15 PM   Specimen: Nasopharyngeal Swab  Result Value Ref Range Status   SARS Coronavirus 2 NEGATIVE NEGATIVE Final    Comment: (NOTE) SARS-CoV-2 target nucleic acids are NOT DETECTED.  The SARS-CoV-2 RNA is generally detectable in upper and lower respiratory specimens during the acute phase of infection. The lowest concentration of SARS-CoV-2 viral copies this assay can detect is  250 copies / mL. A negative result does not preclude SARS-CoV-2 infection and should not be used as the sole basis for treatment or other patient management decisions.  A negative result may occur with improper specimen collection / handling, submission of specimen other than nasopharyngeal swab, presence of viral mutation(s) within the areas targeted by this assay, and inadequate number of viral copies (<250 copies / mL). A negative result must be combined with clinical observations, patient history, and epidemiological information.  Fact Sheet for Patients:   BoilerBrush.com.cy  Fact Sheet for Healthcare Providers: https://pope.com/  This test is not yet approved or  cleared by the [place] FDA and has been authorized for detection and/or diagnosis of SARS-CoV-2 by FDA under an Emergency Use Authorization (EUA).  This EUA will remain in effect (meaning this test can  be used) for the duration of the COVID-19 declaration under Section 564(b)(1) of the Act, 21 U.S.C. section 360bbb-3(b)(1), unless the authorization is terminated or revoked sooner.  Performed at [facility], [address] [place], [place] [address]   MRSA PCR Screening     Status: None   Collection Time: 05/28/20  7:55 PM   Specimen: Nasal Mucosa; Nasopharyngeal  Result Value Ref Range Status   MRSA by PCR NEGATIVE NEGATIVE Final    Comment:        The GeneXpert MRSA Assay (FDA approved for NASAL specimens only), is one component of a comprehensive MRSA colonization surveillance program. It is not intended to diagnose MRSA infection nor to guide or monitor treatment for MRSA infections. Performed at [facility] [address], [place], [place] [address]   Surgical PCR screen     Status: Abnormal   Collection Time: 06/01/20  8:08 AM   Specimen: Nasal Mucosa; Nasal Swab  Result Value Ref Range Status   MRSA, PCR NEGATIVE NEGATIVE  Final   Staphylococcus aureus POSITIVE (A) NEGATIVE Final    Comment: (NOTE) The Xpert SA Assay (FDA approved for NASAL specimens in patients 19 years of age and older), is one component of a comprehensive surveillance program. It is not intended to diagnose infection nor to guide or monitor treatment. Performed at [facility], [address] [place], [place] [address]      Labs: BNP (last 3 results) No results for input(s): BNP in the last 8760 hours. Basic Metabolic Panel: Recent Labs  Lab 05/28/20 1625 05/29/20 0439 05/30/20 0602  NA 140 143 144  K 3.4* 3.1* 3.5  CL 104 107 107  CO2 27 30 22   GLUCOSE 108* 79 111*  BUN 18 14 17   CREATININE 1.01* 0.99 0.80  CALCIUM 9.6 9.0 9.7  MG  --  2.3 2.4  PHOS  --  3.9 3.2   Liver Function Tests: Recent Labs  Lab 05/28/20 1625 05/30/20 0602  AST 30 23  ALT 27 22  ALKPHOS 94 80  BILITOT 0.6 0.7  PROT 7.4 7.4  ALBUMIN 4.3 4.0   No results for input(s): LIPASE, AMYLASE in the last 168 hours. No results for input(s): AMMONIA in the last 168 hours. CBC: Recent Labs  Lab 05/28/20 1625 05/29/20 0439 05/30/20 0602  WBC 11.1* 7.7 10.0  NEUTROABS 6.4  --   --   HGB 12.7 11.6* 12.7  HCT 41.1 38.1 41.2  MCV 79.0* 79.2* 78.0*  PLT 347 299 330   Cardiac Enzymes: No results for input(s): CKTOTAL, CKMB, CKMBINDEX, TROPONINI in the last 168 hours. BNP: Invalid input(s): POCBNP CBG: Recent Labs  Lab 05/28/20 1620 05/28/20 1954 06/01/20 1222 06/01/20 1400  GLUCAP 97 128* 98 110*   D-Dimer No results for input(s): DDIMER in the last 72 hours. Hgb A1c Recent Labs    05/30/20 0602  HGBA1C 5.9*   Lipid Profile Recent Labs    05/30/20 0602  CHOL 155  HDL 50  LDLCALC 86  TRIG 97  CHOLHDL 3.1   Thyroid function studies No results for input(s): TSH, T4TOTAL, T3FREE, THYROIDAB in the last 72 hours.  Invalid input(s): FREET3 Anemia work up No results for input(s): VITAMINB12, FOLATE, FERRITIN, TIBC,  IRON, RETICCTPCT in the last 72 hours. Urinalysis    Component Value Date/Time   COLORURINE COLORLESS (A) 09/06/2019 0917   APPEARANCEUR CLEAR (A) 09/06/2019 0917   LABSPEC 1.030 09/06/2019 0917   PHURINE 7.0 09/06/2019 09/08/2019  GLUCOSEU NEGATIVE 09/06/2019 0917   HGBUR NEGATIVE 09/06/2019 0917   BILIRUBINUR NEGATIVE 09/06/2019 0917   KETONESUR NEGATIVE 09/06/2019 0917   PROTEINUR NEGATIVE 09/06/2019 0917   NITRITE NEGATIVE 09/06/2019 0917   LEUKOCYTESUR NEGATIVE 09/06/2019 0917   Sepsis Labs Invalid input(s): PROCALCITONIN,  WBC,  LACTICIDVEN Microbiology Recent Results (from the past 240 hour(s))  SARS Coronavirus 2 by RT PCR (hospital order, performed in [facility] hospital lab) Nasopharyngeal Nasopharyngeal Swab     Status: None   Collection Time: 05/28/20  6:15 PM   Specimen: Nasopharyngeal Swab  Result Value Ref Range Status   SARS Coronavirus 2 NEGATIVE NEGATIVE Final    Comment: (NOTE) SARS-CoV-2 target nucleic acids are NOT DETECTED.  The SARS-CoV-2 RNA is generally detectable in upper and lower respiratory specimens during the acute phase of infection. The lowest concentration of SARS-CoV-2 viral copies this assay can detect is 250 copies / mL. A negative result does not preclude SARS-CoV-2 infection and should not be used as the sole basis for treatment or other patient management decisions.  A negative result may occur with improper specimen collection / handling, submission of specimen other than nasopharyngeal swab, presence of viral mutation(s) within the areas targeted by this assay, and inadequate number of viral copies (<250 copies / mL). A negative result must be combined with clinical observations, patient history, and epidemiological information.  Fact Sheet for Patients:   BoilerBrush.com.cy  Fact Sheet for Healthcare Providers: https://pope.com/  This test is not yet approved or  cleared by the [place] FDA and has been authorized for detection and/or diagnosis of SARS-CoV-2 by FDA under an Emergency Use Authorization (EUA).  This EUA will remain in effect (meaning this test can be used) for the duration of the COVID-19 declaration under Section 564(b)(1) of the Act, 21 U.S.C. section 360bbb-3(b)(1), unless the authorization is terminated or revoked sooner.  Performed at [facility], [address] [place], [place] [address]   MRSA PCR Screening     Status: None   Collection Time: 05/28/20  7:55 PM   Specimen: Nasal Mucosa; Nasopharyngeal  Result Value Ref Range Status   MRSA by PCR NEGATIVE NEGATIVE Final    Comment:        The GeneXpert MRSA Assay (FDA approved for NASAL specimens only), is one component of a comprehensive MRSA colonization surveillance program. It is not intended to diagnose MRSA infection nor to guide or monitor treatment for MRSA infections. Performed at [facility], [address] [place], [place] [address]   Surgical PCR screen     Status: Abnormal   Collection Time: 06/01/20  8:08 AM   Specimen: Nasal Mucosa; Nasal Swab  Result Value Ref Range Status   MRSA, PCR NEGATIVE NEGATIVE Final   Staphylococcus aureus POSITIVE (A) NEGATIVE Final    Comment: (NOTE) The Xpert SA Assay (FDA approved for NASAL specimens in patients 66 years of age and older), is one component of a comprehensive surveillance program. It is not intended to diagnose infection nor to guide or monitor treatment. Performed at [facility], [address] [place], [place] [address]      Time coordinating discharge: Over 30 minutes  SIGNED:   Alvira Philips [place], DO  [facility] 06/01/2020, 2:01 PM

## 2020-06-02 ENCOUNTER — Encounter: Payer: Self-pay | Admitting: Internal Medicine

## 2020-07-31 DIAGNOSIS — Z8673 Personal history of transient ischemic attack (TIA), and cerebral infarction without residual deficits: Secondary | ICD-10-CM | POA: Insufficient documentation

## 2020-07-31 DIAGNOSIS — R531 Weakness: Secondary | ICD-10-CM | POA: Insufficient documentation

## 2020-09-26 DIAGNOSIS — R748 Abnormal levels of other serum enzymes: Secondary | ICD-10-CM | POA: Insufficient documentation

## 2020-09-26 DIAGNOSIS — I471 Supraventricular tachycardia, unspecified: Secondary | ICD-10-CM | POA: Insufficient documentation

## 2020-09-26 DIAGNOSIS — E559 Vitamin D deficiency, unspecified: Secondary | ICD-10-CM | POA: Insufficient documentation

## 2020-09-26 DIAGNOSIS — E039 Hypothyroidism, unspecified: Secondary | ICD-10-CM | POA: Insufficient documentation

## 2020-09-26 DIAGNOSIS — F329 Major depressive disorder, single episode, unspecified: Secondary | ICD-10-CM | POA: Insufficient documentation

## 2020-10-30 ENCOUNTER — Other Ambulatory Visit: Payer: Self-pay | Admitting: Cardiology

## 2020-10-30 ENCOUNTER — Other Ambulatory Visit (HOSPITAL_COMMUNITY): Payer: Self-pay | Admitting: Cardiology

## 2020-10-30 DIAGNOSIS — I639 Cerebral infarction, unspecified: Secondary | ICD-10-CM

## 2020-10-31 ENCOUNTER — Other Ambulatory Visit: Payer: Self-pay

## 2020-10-31 ENCOUNTER — Ambulatory Visit
Admission: RE | Admit: 2020-10-31 | Discharge: 2020-10-31 | Disposition: A | Discharge status: Home / Self Care | Payer: Medicare Other | Source: Ambulatory Visit | Attending: Cardiology | Admitting: Cardiology

## 2020-10-31 DIAGNOSIS — I639 Cerebral infarction, unspecified: Secondary | ICD-10-CM | POA: Diagnosis not present

## 2020-10-31 MED ORDER — GADOBUTROL 1 MMOL/ML IV SOLN
6.0000 mL | Freq: Once | INTRAVENOUS | Status: AC | PRN
  Administered 2020-10-31: 09:00:00 6 mL via INTRAVENOUS

## 2020-11-16 DIAGNOSIS — I480 Paroxysmal atrial fibrillation: Secondary | ICD-10-CM | POA: Insufficient documentation

## 2021-03-20 ENCOUNTER — Other Ambulatory Visit
Admission: RE | Admit: 2021-03-20 | Discharge: 2021-03-20 | Disposition: A | Discharge status: Home / Self Care | Payer: Medicare Other | Source: Ambulatory Visit | Attending: Pulmonary Disease | Admitting: Pulmonary Disease

## 2021-03-20 DIAGNOSIS — R0602 Shortness of breath: Secondary | ICD-10-CM | POA: Diagnosis present

## 2021-03-20 DIAGNOSIS — J449 Chronic obstructive pulmonary disease, unspecified: Secondary | ICD-10-CM | POA: Insufficient documentation

## 2021-03-20 LAB — D-DIMER, QUANTITATIVE: D-Dimer, Quant: <0.27 ug/mL-FEU (ref 0.00–0.50)

## 2021-05-07 ENCOUNTER — Encounter: Payer: Self-pay | Admitting: Intensive Care

## 2021-05-07 ENCOUNTER — Emergency Department
Admission: EM | Admit: 2021-05-07 | Discharge: 2021-05-08 | Disposition: A | Discharge status: Home / Self Care | Payer: Medicare Other | Attending: Emergency Medicine | Admitting: Emergency Medicine

## 2021-05-07 ENCOUNTER — Other Ambulatory Visit: Payer: Self-pay

## 2021-05-07 DIAGNOSIS — D509 Iron deficiency anemia, unspecified: Secondary | ICD-10-CM | POA: Diagnosis not present

## 2021-05-07 DIAGNOSIS — D649 Anemia, unspecified: Secondary | ICD-10-CM

## 2021-05-07 DIAGNOSIS — I10 Essential (primary) hypertension: Secondary | ICD-10-CM | POA: Diagnosis not present

## 2021-05-07 DIAGNOSIS — Z955 Presence of coronary angioplasty implant and graft: Secondary | ICD-10-CM | POA: Diagnosis not present

## 2021-05-07 DIAGNOSIS — Z7984 Long term (current) use of oral hypoglycemic drugs: Secondary | ICD-10-CM | POA: Insufficient documentation

## 2021-05-07 DIAGNOSIS — J45909 Unspecified asthma, uncomplicated: Secondary | ICD-10-CM | POA: Insufficient documentation

## 2021-05-07 DIAGNOSIS — R799 Abnormal finding of blood chemistry, unspecified: Secondary | ICD-10-CM | POA: Diagnosis present

## 2021-05-07 DIAGNOSIS — Z87891 Personal history of nicotine dependence: Secondary | ICD-10-CM | POA: Insufficient documentation

## 2021-05-07 DIAGNOSIS — Z79899 Other long term (current) drug therapy: Secondary | ICD-10-CM | POA: Diagnosis not present

## 2021-05-07 DIAGNOSIS — E119 Type 2 diabetes mellitus without complications: Secondary | ICD-10-CM | POA: Insufficient documentation

## 2021-05-07 DIAGNOSIS — J449 Chronic obstructive pulmonary disease, unspecified: Secondary | ICD-10-CM | POA: Diagnosis not present

## 2021-05-07 DIAGNOSIS — E039 Hypothyroidism, unspecified: Secondary | ICD-10-CM | POA: Insufficient documentation

## 2021-05-07 DIAGNOSIS — Z7951 Long term (current) use of inhaled steroids: Secondary | ICD-10-CM | POA: Diagnosis not present

## 2021-05-07 LAB — COMPREHENSIVE METABOLIC PANEL
ALT: 17 U/L (ref 0–44)
AST: 18 U/L (ref 15–41)
Albumin: 3.8 g/dL (ref 3.5–5.0)
Alkaline Phosphatase: 77 U/L (ref 38–126)
Anion gap: 11 (ref 5–15)
BUN: 19 mg/dL (ref 6–20)
CO2: 27 mmol/L (ref 22–32)
Calcium: 9.9 mg/dL (ref 8.9–10.3)
Chloride: 101 mmol/L (ref 98–111)
Creatinine, Ser: 1.38 mg/dL — ABNORMAL HIGH (ref 0.44–1.00)
GFR, Estimated: 44 mL/min — ABNORMAL LOW (ref >60)
Glucose, Bld: 266 mg/dL — ABNORMAL HIGH (ref 70–99)
Potassium: 4.1 mmol/L (ref 3.5–5.1)
Sodium: 139 mmol/L (ref 135–145)
Total Bilirubin: 0.4 mg/dL (ref 0.3–1.2)
Total Protein: 7.1 g/dL (ref 6.5–8.1)

## 2021-05-07 LAB — CBC WITH DIFFERENTIAL/PLATELET
Abs Immature Granulocytes: 0.09 10*3/uL — ABNORMAL HIGH (ref 0.00–0.07)
Basophils Absolute: 0.1 10*3/uL (ref 0.0–0.1)
Basophils Relative: 1 %
Eosinophils Absolute: 0 10*3/uL (ref 0.0–0.5)
Eosinophils Relative: 0 %
HCT: 23.7 % — ABNORMAL LOW (ref 36.0–46.0)
Hemoglobin: 6.2 g/dL — ABNORMAL LOW (ref 12.0–15.0)
Immature Granulocytes: 1 %
Lymphocytes Relative: 9 %
Lymphs Abs: 1.3 10*3/uL (ref 0.7–4.0)
MCH: 15.7 pg — ABNORMAL LOW (ref 26.0–34.0)
MCHC: 26.2 g/dL — ABNORMAL LOW (ref 30.0–36.0)
MCV: 60 fL — ABNORMAL LOW (ref 80.0–100.0)
Monocytes Absolute: 0.6 10*3/uL (ref 0.1–1.0)
Monocytes Relative: 4 %
Neutro Abs: 13.1 10*3/uL — ABNORMAL HIGH (ref 1.7–7.7)
Neutrophils Relative %: 85 %
Platelets: 531 10*3/uL — ABNORMAL HIGH (ref 150–400)
RBC: 3.95 MIL/uL (ref 3.87–5.11)
RDW: 24 % — ABNORMAL HIGH (ref 11.5–15.5)
WBC: 15.1 10*3/uL — ABNORMAL HIGH (ref 4.0–10.5)
nRBC: 0.2 % (ref 0.0–0.2)

## 2021-05-07 LAB — ABO/RH: ABO/RH(D): O POS

## 2021-05-07 LAB — PREPARE RBC (CROSSMATCH)

## 2021-05-07 MED ORDER — SODIUM CHLORIDE 0.9 % IV SOLN: 10.0000 mL/h | Freq: Once | INTRAVENOUS | Status: DC

## 2021-05-07 NOTE — ED Triage Notes (Addendum)
Patient sent by doctors office for low hemoglobin. Hx anemia

## 2021-05-07 NOTE — ED Provider Notes (Signed)
[facility] Emergency Department Provider Note ____________________________________________   Event Date/Time   First MD Initiated Contact with Patient 05/07/21 1845     (approximate)  I have reviewed the triage vital signs and the nursing notes.  HISTORY  Chief Complaint Abnormal Lab   HPI Kerri Steele is a 60 y.o. femalewho presents to the ED for evaluation of abnormal outpatient blood work.  Chart review indicates history of obesity, HTN, hypothyroidism, DM, GERD.  COPD.  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  Anticoagulated on Eliquis.  Chronic pain syndrome on opiates.  History of iron deficiency anemia.  Patient presents to the ED today at the direction of her PCP.  She saw her PCP 3 days ago, on Friday, for progressive subacute dizziness and dyspnea on exertion.  Blood work was sent at that time, resulting Saturday.  Patient reports that she really did not want to go to the ED over the weekend, so she presents today for evaluation of low outpatient hemoglobin with a hemoglobin of 4.6 and the blood work that was drawn 3 days ago.  She denies any full syncopal episodes, just reports dizziness and presyncope with standing, ambulation.  Further reports dyspnea on exertion without cough, fever, chest pain, abdominal pain or emesis.  Denies melena, hematochezia, hematuria or any symptoms of blood loss.  Past Medical History:  Diagnosis Date   Anginal pain (HCC)    Anxiety    Asthma    COPD (chronic obstructive pulmonary disease) (HCC)    Depression    Diabetes mellitus without complication (HCC)    GERD (gastroesophageal reflux disease)    Hypertension    Hypothyroidism    Pneumonia    Shortness of breath dyspnea    Sleep apnea     Patient Active Problem List   Diagnosis Date Noted   PFO (patent foramen ovale) 06/01/2020   Benign essential HTN 06/01/2020   Type 2 diabetes mellitus with hyperlipidemia (HCC) 06/01/2020    COPD without exacerbation (HCC) 06/01/2020   GERD (gastroesophageal reflux disease) 06/01/2020   HLD (hyperlipidemia) 06/01/2020   Chronic pain disorder 06/01/2020   Stroke (HCC) 05/28/2020   Acute colitis 07/30/2016   Breath shortness 10/18/2012    Past Surgical History:  Procedure Laterality Date   ANKLE SURGERY     BLADDER SURGERY     CARDIAC CATHETERIZATION Left 08/23/2015   Procedure: Left Heart Cath and Coronary Angiography;  Surgeon: Dalia HeadingKenneth A Fath, MD;  Location: [facility] INVASIVE CV LAB;  Service: Cardiovascular;  Laterality: Left;   ESOPHAGEAL DILATION     LOOP RECORDER INSERTION N/A 06/01/2020   Procedure: LOOP RECORDER INSERTION;  Surgeon: Marcina MillardParaschos, Alexander, MD;  Location: [facility] INVASIVE CV LAB;  Service: Cardiovascular;  Laterality: N/A;   TEE WITHOUT CARDIOVERSION N/A 06/01/2020   Procedure: TRANSESOPHAGEAL ECHOCARDIOGRAM (TEE);  Surgeon: Lamar BlinksKowalski, Bruce J, MD;  Location: [facility] ORS;  Service: Cardiovascular;  Laterality: N/A;   TUBAL LIGATION     WRIST SURGERY      Prior to Admission medications   Medication Sig Start Date End Date Taking? Authorizing Provider  arformoterol (BROVANA) 15 MCG/2ML NEBU Take 15 mcg by nebulization 2 (two) times daily.    [provider]  atorvastatin (LIPITOR) 40 MG tablet Take 1 tablet (40 mg total) by mouth daily. 06/01/20 08/30/20  [place], Alvira PhilipsEric J, DO  DULoxetine (CYMBALTA) 60 MG capsule Take 60 mg by mouth daily.    [provider]  glipiZIDE (GLUCOTROL XL) 10 MG  24 hr tablet Take 10 mg by mouth daily with breakfast.    [provider]  HYDROcodone-acetaminophen (NORCO) 5-325 MG tablet Take 1 tablet by mouth every 6 (six) hours as needed for severe pain. 08/01/17   Merrily Brittle, MD  Ipratropium-Albuterol (COMBIVENT RESPIMAT) 20-100 MCG/ACT AERS respimat Inhale 1 puff into the lungs every 6 (six) hours.    [provider]  magnesium oxide (MAG-OX) 400 MG tablet Take 400 mg by mouth 2 (two) times daily.      [provider]  methocarbamol (ROBAXIN) 750 MG tablet Take 750 mg by mouth 3 (three) times daily.     [provider]  omeprazole (PRILOSEC) 20 MG capsule Take 20 mg by mouth daily.    [provider]  pregabalin (LYRICA) 150 MG capsule Take 150 mg by mouth 2 (two) times daily. 05/22/20   [provider]  sucralfate (CARAFATE) 1 g tablet Take 1 tablet (1 g total) by mouth 4 (four) times daily. 02/16/17 02/16/18  Jene Every, MD  tiotropium (SPIRIVA) 18 MCG inhalation capsule Place 18 mcg into inhaler and inhale daily.    [provider]  traMADol (ULTRAM) 50 MG tablet Take 50 mg by mouth every 6 (six) hours as needed for moderate pain.    [provider]  verapamil (CALAN-SR) 120 MG CR tablet Take 120 mg by mouth daily. 02/06/17   [provider]    Allergies Metformin and related, Morphine and related, Other, and Sulfa antibiotics  Family History  Problem Relation Age of Onset   Heart disease Father    Hypertension Mother    Diabetes Mother    Lupus Mother    Atrial fibrillation Mother     Social History Social History   Tobacco Use   Smoking status: Former    Pack years: 0.00   Smokeless tobacco: Never  Substance Use Topics   Alcohol use: No   Drug use: Yes    Comment: prescribed tramadol, oxycodone,and robaxin    Review of Systems  Constitutional: No fever/chills.  Positive generalized weakness and presyncopal dizziness Eyes: No visual changes. ENT: No sore throat. Cardiovascular: Denies chest pain. Respiratory: Positive for dyspnea on exertion.  Negative for cough. Gastrointestinal: No abdominal pain.  No nausea, no vomiting.  No diarrhea.  No constipation. Genitourinary: Negative for dysuria. Musculoskeletal: Negative for back pain. Skin: Negative for rash. Neurological: Negative for headaches, focal weakness or numbness.  ____________________________________________   PHYSICAL EXAM:  VITAL  SIGNS: Vitals:   05/07/21 2220 05/07/21 2238  BP: 140/77 (!) 140/56  Pulse: 80 76  Resp: 17 20  Temp: 98.8 F (37.1 C) 98.7 F (37.1 C)  SpO2: 99% 98%    Constitutional: Alert and oriented. Well appearing and in no acute distress. Eyes: Conjunctivae are normal. PERRL. EOMI. Head: Atraumatic. Nose: No congestion/rhinnorhea. Mouth/Throat: Mucous membranes are moist.  Oropharynx non-erythematous. Neck: No stridor. No cervical spine tenderness to palpation. Cardiovascular: Normal rate, regular rhythm. Grossly normal heart sounds.  Good peripheral circulation. Respiratory: Normal respiratory effort.  No retractions. Lungs CTAB. Gastrointestinal: Soft , nondistended, nontender to palpation. No CVA tenderness. Musculoskeletal: No lower extremity tenderness nor edema.  No joint effusions. No signs of acute trauma. Neurologic:  Normal speech and language. No gross focal neurologic deficits are appreciated. No gait instability noted. Skin:  Skin is warm, dry and intact. No rash noted. Psychiatric: Mood and affect are normal. Speech and behavior are normal. ____________________________________________   LABS (all labs ordered are listed, but only  abnormal results are displayed)  Labs Reviewed  CBC WITH DIFFERENTIAL/PLATELET - Abnormal; Notable for the following components:      Result Value   WBC 15.1 (*)    Hemoglobin 6.2 (*)    HCT 23.7 (*)    MCV 60.0 (*)    MCH 15.7 (*)    MCHC 26.2 (*)    RDW 24.0 (*)    Platelets 531 (*)    Neutro Abs 13.1 (*)    Abs Immature Granulocytes 0.09 (*)    All other components within normal limits  COMPREHENSIVE METABOLIC PANEL - Abnormal; Notable for the following components:   Glucose, Bld 266 (*)    Creatinine, Ser 1.38 (*)    GFR, Estimated 44 (*)    All other components within normal limits  TYPE AND SCREEN  PREPARE RBC (CROSSMATCH)  ABO/RH   ____________________________________________  12 Lead  EKG   ____________________________________________  RADIOLOGY  ED MD interpretation:    Official radiology report(s): No results found.  ____________________________________________   PROCEDURES and INTERVENTIONS  Procedure(s) performed (including Critical Care):  .1-3 Lead EKG Interpretation  Date/Time: 05/07/2021 7:34 PM Performed by: Delton Prairie, MD Authorized by: Delton Prairie, MD     Interpretation: normal     ECG rate:  92   ECG rate assessment: normal     Rhythm: sinus rhythm     Ectopy: none     Conduction: normal   .Critical Care  Date/Time: 05/07/2021 7:34 PM Performed by: Delton Prairie, MD Authorized by: Delton Prairie, MD   Critical care provider statement:    Critical care time (minutes):  45   Critical care was necessary to treat or prevent imminent or life-threatening deterioration of the following conditions:  Circulatory failure   Critical care was time spent personally by me on the following activities:  Discussions with consultants, evaluation of patient's response to treatment, examination of patient, ordering and performing treatments and interventions, ordering and review of laboratory studies, ordering and review of radiographic studies, pulse oximetry, re-evaluation of patient's condition, obtaining history from patient or surrogate and review of old charts  Medications  0.9 %  sodium chloride infusion (has no administration in time range)    ____________________________________________   MDM / ED COURSE   60 year old woman presents to the ED with symptomatic anemia requiring transfusions and likely amenable to outpatient management.  Normal vitals on room air without tachycardia or hypotension.  She looks well clinically.  No evidence of neurologic deficits or vascular deficits.  Rectal exam with Hemoccult negative brown stool.  She is microcytic with rather low MCV and this may be all attributed to uncontrolled iron deficiency.  She reports that  she is intolerant of iron tablets and has had to have iron transfusions IV in the past.  We will plan to transfuse 1 unit of PRBCs and anticipate outpatient management thereafter.   Clinical Course as of 05/07/21 2249  Mon May 07, 2021  1906 Rectal exam chaperoned by Eliberto Ivory RN, Hemoccult negative brown stool.  We discussed possible etiologies of her microcytic anemia. [DS]    Clinical Course User Index [DS] Delton Prairie, MD    ____________________________________________   FINAL CLINICAL IMPRESSION(S) / ED DIAGNOSES  Final diagnoses:  Symptomatic anemia  Microcytic anemia     ED Discharge Orders     None        Shahrukh Pasch Katrinka Blazing   Note:  This document was prepared using Dragon voice recognition software and may include unintentional dictation errors.  Delton Prairie, MD 05/07/21 2249

## 2021-05-08 LAB — TYPE AND SCREEN
ABO/RH(D): O POS
Antibody Screen: NEGATIVE
Unit division: 0

## 2021-05-08 LAB — BPAM RBC
Blood Product Expiration Date: 202207272359
ISSUE DATE / TIME: 202206272017
Unit Type and Rh: 5100

## 2021-05-18 ENCOUNTER — Other Ambulatory Visit: Payer: Self-pay

## 2021-05-18 ENCOUNTER — Inpatient Hospital Stay: Payer: Medicare Other | Admitting: Oncology

## 2021-05-18 ENCOUNTER — Inpatient Hospital Stay: Payer: Medicare Other

## 2021-05-18 ENCOUNTER — Emergency Department: Payer: Medicare Other

## 2021-05-18 DIAGNOSIS — D509 Iron deficiency anemia, unspecified: Principal | ICD-10-CM | POA: Insufficient documentation

## 2021-05-18 DIAGNOSIS — E039 Hypothyroidism, unspecified: Secondary | ICD-10-CM | POA: Insufficient documentation

## 2021-05-18 DIAGNOSIS — Z79899 Other long term (current) drug therapy: Secondary | ICD-10-CM | POA: Insufficient documentation

## 2021-05-18 DIAGNOSIS — I480 Paroxysmal atrial fibrillation: Secondary | ICD-10-CM | POA: Insufficient documentation

## 2021-05-18 DIAGNOSIS — D649 Anemia, unspecified: Secondary | ICD-10-CM | POA: Diagnosis present

## 2021-05-18 DIAGNOSIS — J441 Chronic obstructive pulmonary disease with (acute) exacerbation: Secondary | ICD-10-CM | POA: Insufficient documentation

## 2021-05-18 DIAGNOSIS — Z7984 Long term (current) use of oral hypoglycemic drugs: Secondary | ICD-10-CM | POA: Insufficient documentation

## 2021-05-18 DIAGNOSIS — Z20822 Contact with and (suspected) exposure to covid-19: Secondary | ICD-10-CM | POA: Insufficient documentation

## 2021-05-18 DIAGNOSIS — Z9981 Dependence on supplemental oxygen: Secondary | ICD-10-CM | POA: Insufficient documentation

## 2021-05-18 DIAGNOSIS — Z87891 Personal history of nicotine dependence: Secondary | ICD-10-CM | POA: Insufficient documentation

## 2021-05-18 DIAGNOSIS — Z7901 Long term (current) use of anticoagulants: Secondary | ICD-10-CM | POA: Diagnosis not present

## 2021-05-18 DIAGNOSIS — I10 Essential (primary) hypertension: Secondary | ICD-10-CM | POA: Insufficient documentation

## 2021-05-18 DIAGNOSIS — E119 Type 2 diabetes mellitus without complications: Secondary | ICD-10-CM | POA: Insufficient documentation

## 2021-05-18 DIAGNOSIS — J45909 Unspecified asthma, uncomplicated: Secondary | ICD-10-CM | POA: Insufficient documentation

## 2021-05-18 LAB — TROPONIN I (HIGH SENSITIVITY): Troponin I (High Sensitivity): 5 ng/L (ref <18)

## 2021-05-18 LAB — BASIC METABOLIC PANEL
Anion gap: 6 (ref 5–15)
BUN: 17 mg/dL (ref 6–20)
CO2: 29 mmol/L (ref 22–32)
Calcium: 9.2 mg/dL (ref 8.9–10.3)
Chloride: 104 mmol/L (ref 98–111)
Creatinine, Ser: 0.92 mg/dL (ref 0.44–1.00)
GFR, Estimated: >60 mL/min (ref >60)
Glucose, Bld: 130 mg/dL — ABNORMAL HIGH (ref 70–99)
Potassium: 4.4 mmol/L (ref 3.5–5.1)
Sodium: 139 mmol/L (ref 135–145)

## 2021-05-18 LAB — CBC
HCT: 29.6 % — ABNORMAL LOW (ref 36.0–46.0)
Hemoglobin: 7.8 g/dL — ABNORMAL LOW (ref 12.0–15.0)
MCH: 17.8 pg — ABNORMAL LOW (ref 26.0–34.0)
MCHC: 26.4 g/dL — ABNORMAL LOW (ref 30.0–36.0)
MCV: 67.4 fL — ABNORMAL LOW (ref 80.0–100.0)
Platelets: 445 10*3/uL — ABNORMAL HIGH (ref 150–400)
RBC: 4.39 MIL/uL (ref 3.87–5.11)
RDW: 31.9 % — ABNORMAL HIGH (ref 11.5–15.5)
WBC: 11.9 10*3/uL — ABNORMAL HIGH (ref 4.0–10.5)
nRBC: 1.6 % — ABNORMAL HIGH (ref 0.0–0.2)

## 2021-05-18 MED ORDER — ASPIRIN 81 MG PO CHEW: 324.0000 mg | CHEWABLE_TABLET | Freq: Once | ORAL | Status: DC

## 2021-05-18 NOTE — ED Triage Notes (Signed)
Pt reports shortness of breath, sent to er by her doctor due to low blood count. Pt reports feeling tired, fatigued. Talks in complete sentences, no respiratory distress noted. Pt reports she wears oxygen due to COPD at night 2l/[place]

## 2021-05-19 ENCOUNTER — Observation Stay
Admission: EM | Admit: 2021-05-19 | Discharge: 2021-05-20 | Disposition: A | Discharge status: Home / Self Care | Payer: Medicare Other | Attending: Internal Medicine | Admitting: Internal Medicine

## 2021-05-19 ENCOUNTER — Encounter: Payer: Self-pay | Admitting: Internal Medicine

## 2021-05-19 ENCOUNTER — Other Ambulatory Visit: Payer: Self-pay

## 2021-05-19 DIAGNOSIS — D649 Anemia, unspecified: Secondary | ICD-10-CM | POA: Diagnosis not present

## 2021-05-19 DIAGNOSIS — E1169 Type 2 diabetes mellitus with other specified complication: Secondary | ICD-10-CM | POA: Diagnosis present

## 2021-05-19 DIAGNOSIS — D500 Iron deficiency anemia secondary to blood loss (chronic): Secondary | ICD-10-CM

## 2021-05-19 DIAGNOSIS — Q211 Atrial septal defect: Secondary | ICD-10-CM

## 2021-05-19 DIAGNOSIS — J441 Chronic obstructive pulmonary disease with (acute) exacerbation: Secondary | ICD-10-CM | POA: Diagnosis present

## 2021-05-19 DIAGNOSIS — Q2112 Patent foramen ovale: Secondary | ICD-10-CM

## 2021-05-19 DIAGNOSIS — I10 Essential (primary) hypertension: Secondary | ICD-10-CM | POA: Diagnosis present

## 2021-05-19 DIAGNOSIS — E785 Hyperlipidemia, unspecified: Secondary | ICD-10-CM | POA: Diagnosis present

## 2021-05-19 LAB — IRON AND TIBC
Iron: 26 ug/dL — ABNORMAL LOW (ref 28–170)
Saturation Ratios: 5 % — ABNORMAL LOW (ref 10.4–31.8)
TIBC: 479 ug/dL — ABNORMAL HIGH (ref 250–450)
UIBC: 453 ug/dL

## 2021-05-19 LAB — FOLATE: Folate: 15 ng/mL (ref >5.9)

## 2021-05-19 LAB — CBC
HCT: 27.1 % — ABNORMAL LOW (ref 36.0–46.0)
Hemoglobin: 7.1 g/dL — ABNORMAL LOW (ref 12.0–15.0)
MCH: 17.8 pg — ABNORMAL LOW (ref 26.0–34.0)
MCHC: 26.2 g/dL — ABNORMAL LOW (ref 30.0–36.0)
MCV: 68.1 fL — ABNORMAL LOW (ref 80.0–100.0)
Platelets: 389 10*3/uL (ref 150–400)
RBC: 3.98 MIL/uL (ref 3.87–5.11)
RDW: 32 % — ABNORMAL HIGH (ref 11.5–15.5)
WBC: 16.2 10*3/uL — ABNORMAL HIGH (ref 4.0–10.5)
nRBC: 0.6 % — ABNORMAL HIGH (ref 0.0–0.2)

## 2021-05-19 LAB — GLUCOSE, CAPILLARY
Glucose-Capillary: 233 mg/dL — ABNORMAL HIGH (ref 70–99)
Glucose-Capillary: 306 mg/dL — ABNORMAL HIGH (ref 70–99)
Glucose-Capillary: 142 mg/dL — ABNORMAL HIGH (ref 70–99)
Glucose-Capillary: 132 mg/dL — ABNORMAL HIGH (ref 70–99)

## 2021-05-19 LAB — HEMOGLOBIN A1C
Hgb A1c MFr Bld: 5.5 % (ref 4.8–5.6)
Mean Plasma Glucose: 111.15 mg/dL

## 2021-05-19 LAB — RETICULOCYTES
Immature Retic Fract: 44.5 % — ABNORMAL HIGH (ref 2.3–15.9)
RBC.: 4.01 MIL/uL (ref 3.87–5.11)
Retic Count, Absolute: 186.5 10*3/uL — ABNORMAL HIGH (ref 19.0–186.0)
Retic Ct Pct: 4.7 % — ABNORMAL HIGH (ref 0.4–3.1)

## 2021-05-19 LAB — RESP PANEL BY RT-PCR (FLU A&B, COVID) ARPGX2
Influenza A by PCR: NEGATIVE
Influenza B by PCR: NEGATIVE
SARS Coronavirus 2 by RT PCR: NEGATIVE

## 2021-05-19 LAB — TROPONIN I (HIGH SENSITIVITY): Troponin I (High Sensitivity): 5 ng/L (ref <18)

## 2021-05-19 LAB — PREPARE RBC (CROSSMATCH)

## 2021-05-19 LAB — FERRITIN: Ferritin: 35 ng/mL (ref 11–307)

## 2021-05-19 LAB — OCCULT BLOOD X 1 CARD TO LAB, STOOL: Fecal Occult Bld: NEGATIVE

## 2021-05-19 LAB — HEMOGLOBIN AND HEMATOCRIT, BLOOD
HCT: 27.3 % — ABNORMAL LOW (ref 36.0–46.0)
Hemoglobin: 7 g/dL — ABNORMAL LOW (ref 12.0–15.0)

## 2021-05-19 LAB — BRAIN NATRIURETIC PEPTIDE: B Natriuretic Peptide: 35.8 pg/mL (ref 0.0–100.0)

## 2021-05-19 LAB — VITAMIN B12: Vitamin B-12: 1132 pg/mL — ABNORMAL HIGH (ref 180–914)

## 2021-05-19 MED ORDER — IPRATROPIUM-ALBUTEROL 0.5-2.5 (3) MG/3ML IN SOLN
3.0000 mL | RESPIRATORY_TRACT | Status: AC
  Administered 2021-05-19: 3 mL via RESPIRATORY_TRACT
  Filled 2021-05-19: qty 3

## 2021-05-19 MED ORDER — ACETAMINOPHEN 650 MG RE SUPP: 650.0000 mg | Freq: Four times a day (QID) | RECTAL | Status: DC | PRN

## 2021-05-19 MED ORDER — INSULIN ASPART 100 UNIT/ML IJ SOLN
0.0000 [IU] | Freq: Three times a day (TID) | INTRAMUSCULAR | Status: DC
Start: 2021-05-19 — End: 2021-05-20
  Administered 2021-05-19: 11 [IU] via SUBCUTANEOUS
  Administered 2021-05-19: 2 [IU] via SUBCUTANEOUS
  Administered 2021-05-19: 5 [IU] via SUBCUTANEOUS
  Filled 2021-05-19 (×3): qty 1

## 2021-05-19 MED ORDER — PREDNISONE 20 MG PO TABS
40.0000 mg | ORAL_TABLET | Freq: Every day | ORAL | Status: DC
  Administered 2021-05-19: 40 mg via ORAL
  Filled 2021-05-19 (×2): qty 2

## 2021-05-19 MED ORDER — ACETAMINOPHEN 325 MG PO TABS: 650.0000 mg | ORAL_TABLET | Freq: Four times a day (QID) | ORAL | Status: DC | PRN

## 2021-05-19 MED ORDER — INSULIN ASPART 100 UNIT/ML IJ SOLN: 0.0000 [IU] | Freq: Every day | INTRAMUSCULAR | Status: DC

## 2021-05-19 MED ORDER — IPRATROPIUM-ALBUTEROL 0.5-2.5 (3) MG/3ML IN SOLN: 3.0000 mL | RESPIRATORY_TRACT | Status: DC | PRN

## 2021-05-19 MED ORDER — METHYLPREDNISOLONE SODIUM SUCC 125 MG IJ SOLR
125.0000 mg | Freq: Once | INTRAMUSCULAR | Status: AC
  Administered 2021-05-19: 125 mg via INTRAVENOUS
  Filled 2021-05-19: qty 2

## 2021-05-19 MED ORDER — BUDESONIDE 0.5 MG/2ML IN SUSP
0.5000 mg | Freq: Two times a day (BID) | RESPIRATORY_TRACT | Status: DC
  Filled 2021-05-19: qty 2

## 2021-05-19 MED ORDER — HYDROCODONE-ACETAMINOPHEN 5-325 MG PO TABS
1.0000 | ORAL_TABLET | ORAL | Status: DC | PRN
  Administered 2021-05-20: 2 via ORAL
  Filled 2021-05-19: qty 2

## 2021-05-19 MED ORDER — PANTOPRAZOLE SODIUM 40 MG IV SOLR
40.0000 mg | Freq: Two times a day (BID) | INTRAVENOUS | Status: DC
  Administered 2021-05-19 (×2): 40 mg via INTRAVENOUS
  Filled 2021-05-19 (×3): qty 40

## 2021-05-19 MED ORDER — ONDANSETRON HCL 4 MG PO TABS: 4.0000 mg | ORAL_TABLET | Freq: Four times a day (QID) | ORAL | Status: DC | PRN

## 2021-05-19 MED ORDER — IPRATROPIUM-ALBUTEROL 0.5-2.5 (3) MG/3ML IN SOLN
3.0000 mL | Freq: Four times a day (QID) | RESPIRATORY_TRACT | Status: DC
  Filled 2021-05-19: qty 3

## 2021-05-19 MED ORDER — ONDANSETRON HCL 4 MG/2ML IJ SOLN
4.0000 mg | Freq: Four times a day (QID) | INTRAMUSCULAR | Status: DC | PRN
  Administered 2021-05-20 (×2): 4 mg via INTRAVENOUS
  Filled 2021-05-19 (×2): qty 2

## 2021-05-19 MED ORDER — SODIUM CHLORIDE 0.9 % IV SOLN
10.0000 mL/h | Freq: Once | INTRAVENOUS | Status: AC
  Administered 2021-05-19: 10 mL/h via INTRAVENOUS

## 2021-05-19 MED ORDER — PEG 3350-KCL-NA BICARB-NACL 420 G PO SOLR
4000.0000 mL | Freq: Once | ORAL | Status: AC
  Administered 2021-05-19: 4000 mL via ORAL
  Filled 2021-05-19: qty 4000

## 2021-05-19 NOTE — Plan of Care (Signed)

## 2021-05-19 NOTE — Progress Notes (Signed)
Patient arrived alert and oriented able to ambulate with 2 L of O2 baseline for patient via nasal cannula. Patient refused socks.

## 2021-05-19 NOTE — Plan of Care (Signed)
60 year old female with PMH of HTN, DM2, COPD on 2L [place] oxygen, PAF on Eliquis, History of CVA and PFO awaiting surgical repair, and Chronic Anemia of unknown etiology who was sent to the ED by PCP due to dizziness and lightheadedness.  On 6/27, patient received 1 unit pRBC for Hb <7 g/dL.  HH is 7.1/27.  Retic count is 4.7.  BUN  Cr are stable.  There is no acute bleeding.  Last iron sat was 5%.  Symptomatic Chronic Iron Deficiency Anemia while on Eliquis - HH is 7.1/27.  Give 1 unit pRBC given acute symptoms.  Repeat HH. - Hold Eliquis.  Start IV Protonix 40 mg BID. - GI plans EGD and CLN tomorrow, appreciate assistance and recommendations. - Follow up with Hematology outpatient.  Chronic Hypoxic Respiratory Failure: - Oxygen saturation is stable on 2L [place] consistent with baseline.  Chronic COPD: Shortness of breath is likely due to anemia.  Patient currently denies any symptoms.  She is chronically on Prednisone 5 mg daily.   - Continue Prednisone 40 mg daily x 4 more days and de-escalate to 5 mg daily. - Change Duo-Nebs to q6hrs.  Essential Hypertension: - BP is stable.  Diabetes Mellitus Type 2: - Continue Aspart Ssi with POC glucose q6h.  Chronic Atrial Fibrillation: - Continue Verapamil for rate control. - Hold Eliquis due to acute anemia.  PFO:  - Once anemia is stabilized, patient will follow up with GS for surgical repair.  Discharge Plan: Home in 2 days.

## 2021-05-19 NOTE — ED Provider Notes (Addendum)
[facility] Emergency Department Provider Note  ____________________________________________   Event Date/Time   First MD Initiated Contact with Patient 05/19/21 0132     (approximate)  I have reviewed the triage vital signs and the nursing notes.   HISTORY  Chief Complaint Shortness of Breath    HPI Kerri Steele is a 60 y.o. female with history of COPD on home oxygen, hypertension, diabetes, hyperlipidemia, PFO, paroxysmal atrial fibrillation on Eliquis, CVA who presents to the emergency department with concerns for worsening shortness of breath, lightheadedness, syncope over the past several weeks.  Was seen here in the emergency department on 05/07/2021 for the same and was transfused.  Was discharged home from the emergency department.  States she was told to come to the emergency department due to continued anemia.  She states that she was told she would need surgical repair for her PFO but this cannot be done until her anemia issues have been resolved.  She states she has not yet seen hematology.  It appears per her recent cardiology note on 05/15/2021 she had an appointment scheduled today.  She states she had no transportation for this appointment and it was canceled.  She states she has never had a blood transfusion before last week in the ED.  States she is unable to tolerate iron by mouth as it causes her vomiting.  She denies any vaginal bleeding, hematemesis, bloody stool or melena.  Denies any chest pain.        Past Medical History:  Diagnosis Date   Anginal pain (HCC)    Anxiety    Asthma    COPD (chronic obstructive pulmonary disease) (HCC)    Depression    Diabetes mellitus without complication (HCC)    GERD (gastroesophageal reflux disease)    Hypertension    Hypothyroidism    Pneumonia    Shortness of breath dyspnea    Sleep apnea     Patient Active Problem List   Diagnosis Date Noted   PFO (patent foramen ovale) 06/01/2020   Benign  essential HTN 06/01/2020   Type 2 diabetes mellitus with hyperlipidemia (HCC) 06/01/2020   COPD without exacerbation (HCC) 06/01/2020   GERD (gastroesophageal reflux disease) 06/01/2020   HLD (hyperlipidemia) 06/01/2020   Chronic pain disorder 06/01/2020   Stroke (HCC) 05/28/2020   Acute colitis 07/30/2016   Breath shortness 10/18/2012    Past Surgical History:  Procedure Laterality Date   ANKLE SURGERY     BLADDER SURGERY     CARDIAC CATHETERIZATION Left 08/23/2015   Procedure: Left Heart Cath and Coronary Angiography;  Surgeon: Dalia Heading, MD;  Location: [facility] INVASIVE CV LAB;  Service: Cardiovascular;  Laterality: Left;   ESOPHAGEAL DILATION     LOOP RECORDER INSERTION N/A 06/01/2020   Procedure: LOOP RECORDER INSERTION;  Surgeon: Marcina Millard, MD;  Location: [facility] INVASIVE CV LAB;  Service: Cardiovascular;  Laterality: N/A;   TEE WITHOUT CARDIOVERSION N/A 06/01/2020   Procedure: TRANSESOPHAGEAL ECHOCARDIOGRAM (TEE);  Surgeon: Lamar Blinks, MD;  Location: [facility] ORS;  Service: Cardiovascular;  Laterality: N/A;   TUBAL LIGATION     WRIST SURGERY      Prior to Admission medications   Medication Sig Start Date End Date Taking? Authorizing Provider  arformoterol (BROVANA) 15 MCG/2ML NEBU Take 15 mcg by nebulization 2 (two) times daily.    [provider]  atorvastatin (LIPITOR) 40 MG tablet Take 1 tablet (40 mg total) by mouth daily. 06/01/20 08/30/20  [place], Eric J, DO  DULoxetine (CYMBALTA) 60 MG capsule Take 60 mg by mouth daily.    [provider]  glipiZIDE (GLUCOTROL XL) 10 MG 24 hr tablet Take 10 mg by mouth daily with breakfast.    [provider]  HYDROcodone-acetaminophen (NORCO) 5-325 MG tablet Take 1 tablet by mouth every 6 (six) hours as needed for severe pain. 08/01/17   Merrily Brittle, MD  Ipratropium-Albuterol (COMBIVENT RESPIMAT) 20-100 MCG/ACT AERS respimat Inhale 1 puff into the lungs every 6 (six) hours.    [provider]  magnesium oxide (MAG-OX) 400 MG tablet Take 400 mg by mouth 2 (two) times daily.     [provider]  methocarbamol (ROBAXIN) 750 MG tablet Take 750 mg by mouth 3 (three) times daily.     [provider]  omeprazole (PRILOSEC) 20 MG capsule Take 20 mg by mouth daily.    [provider]  pregabalin (LYRICA) 150 MG capsule Take 150 mg by mouth 2 (two) times daily. 05/22/20   [provider]  sucralfate (CARAFATE) 1 g tablet Take 1 tablet (1 g total) by mouth 4 (four) times daily. 02/16/17 02/16/18  Jene Every, MD  tiotropium (SPIRIVA) 18 MCG inhalation capsule Place 18 mcg into inhaler and inhale daily.    [provider]  traMADol (ULTRAM) 50 MG tablet Take 50 mg by mouth every 6 (six) hours as needed for moderate pain.    [provider]  verapamil (CALAN-SR) 120 MG CR tablet Take 120 mg by mouth daily. 02/06/17   [provider]    Allergies Metformin and related, Morphine and related, Other, and Sulfa antibiotics  Family History  Problem Relation Age of Onset   Heart disease Father    Hypertension Mother    Diabetes Mother    Lupus Mother    Atrial fibrillation Mother     Social History Social History   Tobacco Use   Smoking status: Former    Pack years: 0.00   Smokeless tobacco: Never  Substance Use Topics   Alcohol use: No   Drug use: Yes    Comment: prescribed tramadol, oxycodone,and robaxin    Review of Systems Constitutional: No fever. Eyes: No visual changes. ENT: No sore throat. Cardiovascular: Denies chest pain. Respiratory: + shortness of breath. Gastrointestinal: No nausea, vomiting, diarrhea. Genitourinary: Negative for dysuria. Musculoskeletal: Negative for back pain. Skin: Negative for rash. Neurological: Negative for focal weakness or numbness.  ____________________________________________   PHYSICAL EXAM:  VITAL SIGNS: ED Triage Vitals  Enc Vitals Group     BP 05/18/21  1931 (!) 155/80     Pulse Rate 05/18/21 1931 98     Resp 05/18/21 1931 (!) 24     Temp 05/18/21 1931 98.3 F (36.8 C)     Temp Source 05/18/21 1931 Oral     SpO2 05/18/21 1931 98 %     Weight 05/18/21 1933 155 lb (70.3 kg)     Height 05/18/21 1933 5' (1.524 m)     Head Circumference --      Peak Flow --      Pain Score 05/18/21 1932 5     Pain Loc --      Pain Edu? --      Excl. in GC? --    CONSTITUTIONAL: Alert and oriented and responds appropriately to questions. Well-appearing; well-nourished HEAD: Normocephalic EYES: Conjunctivae clear, pupils appear equal, EOM appear intact ENT: normal nose; moist mucous membranes NECK: Supple, normal ROM CARD: RRR; S1 and S2 appreciated; no  murmurs, no clicks, no rubs, no gallops RESP: Patient is tachypneic and looks very short of breath with just minimal movement around in the bed.  She starts to speak truncated sentences but then improves over time.  She has diffuse inspiratory and expiratory wheezes heard on exam with diminished aeration at her bases.  No rhonchi or rales.  No hypoxia on nasal cannula which is her baseline. ABD/GI: Normal bowel sounds; non-distended; soft, non-tender, no rebound, no guarding, no peritoneal signs, no hepatosplenomegaly RECTAL:  Normal rectal tone, no gross blood or melena, guaiac negative, no hemorrhoids appreciated, nontender rectal exam, no fecal impaction. Chaperone present. BACK: The back appears normal EXT: Normal ROM in all joints; no deformity noted, no edema; no cyanosis, no calf tenderness or calf swelling SKIN: Normal color for age and race; warm; no rash on exposed skin NEURO: Moves all extremities equally PSYCH: The patient's mood and manner are appropriate.  ____________________________________________   LABS (all labs ordered are listed, but only abnormal results are displayed)  Labs Reviewed  BASIC METABOLIC PANEL - Abnormal; Notable for the following components:      Result Value    Glucose, Bld 130 (*)    All other components within normal limits  CBC - Abnormal; Notable for the following components:   WBC 11.9 (*)    Hemoglobin 7.8 (*)    HCT 29.6 (*)    MCV 67.4 (*)    MCH 17.8 (*)    MCHC 26.4 (*)    RDW 31.9 (*)    Platelets 445 (*)    nRBC 1.6 (*)    All other components within normal limits  RESP PANEL BY RT-PCR (FLU A&B, COVID) ARPGX2  VITAMIN B12  FOLATE  IRON AND TIBC  FERRITIN  RETICULOCYTES  BRAIN NATRIURETIC PEPTIDE  TYPE AND SCREEN  PREPARE RBC (CROSSMATCH)  TROPONIN I (HIGH SENSITIVITY)  TROPONIN I (HIGH SENSITIVITY)   ____________________________________________  EKG   EKG Interpretation  Date/Time:  Friday May 18 2021 19:40:09 EDT Ventricular Rate:  94 PR Interval:  118 QRS Duration: 70 QT Interval:  346 QTC Calculation: 432 R Axis:   74 Text Interpretation: Normal sinus rhythm Possible Left atrial enlargement Artifact Abnormal ECG Confirmed by Rochele RaringWard, Jenny Omdahl [phone] on 05/19/2021 2:09:16 AM         ____________________________________________  RADIOLOGY Normajean BaxterI, Kayden Hutmacher, personally viewed and evaluated these images (plain radiographs) as part of my medical decision making, as well as reviewing the written report by the radiologist.  ED MD interpretation: Chest x-ray shows no edema, infiltrate.  Official radiology report(s): DG Chest Portable 1 View  Result Date: 05/18/2021 CLINICAL DATA:  Chest pain and shortness of breath EXAM: PORTABLE CHEST 1 VIEW COMPARISON:  05/29/2020 FINDINGS: The heart size and mediastinal contours are within normal limits. Both lungs are clear. The visualized skeletal structures are unremarkable. IMPRESSION: No active disease. Electronically Signed   By: Alcide CleverMark  Lukens M.D.   On: 05/18/2021 19:50    ____________________________________________   PROCEDURES  Procedure(s) performed (including Critical Care):  Procedures  CRITICAL CARE Performed by: Baxter HireKristen Amritha Yorke   Total critical care time:  45 minutes  Critical care time was exclusive of separately billable procedures and treating other patients.  Critical care was necessary to treat or prevent imminent or life-threatening deterioration.  Critical care was time spent personally by me on the following activities: development of treatment plan with patient and/or surrogate as well as nursing, discussions with consultants, evaluation of patient's response to treatment, examination of  patient, obtaining history from patient or surrogate, ordering and performing treatments and interventions, ordering and review of laboratory studies, ordering and review of radiographic studies, pulse oximetry and re-evaluation of patient's condition.  ____________________________________________   INITIAL IMPRESSION / ASSESSMENT AND PLAN / ED COURSE  As part of my medical decision making, I reviewed the following data within the electronic MEDICAL RECORD NUMBER History obtained from family, Nursing notes reviewed and incorporated, Labs reviewed , EKG interpreted , Old EKG reviewed, Old chart reviewed, Radiograph reviewed , Discussed with admitting physician , and Notes from prior ED visits         Patient here with symptomatic anemia and also COPD exacerbation.  Was recently transfused 1 unit of blood in the emergency department for hemoglobin of 6.2.  Had hematology follow-up scheduled on the eighth but did not have transportation.  Has never had significant anemia requiring blood transfusion before.  Reports she is unable to tolerate oral iron.  She is still feeling short of breath and lightheaded and reports recent syncopal events.  Hemoglobin today is 7.8.  Will give another unit of packed red blood cells in the ED and send an anemia panel.  I feel she may need admission for iron transfusion and further monitoring.  She is on Eliquis for history of A. fib and previous stroke but has no signs of active bleeding today.  I suspect that her shortness of breath  is also due to COPD as she appears to have an exacerbation today.  We will give duo nebs, Solu-Medrol.  Her chest x-ray is clear and shows no infiltrate, edema or pneumothorax.  Troponin negative.  EKG does not appear ischemic but significant artifact due to respiratory motion.  Will repeat.  Troponin x2 negative, flat.  Discussed with patient that I have recommended admission today.  She is comfortable with this plan.  ED PROGRESS   2:22 AM  Discussed patient's case with hospitalist, Dr. Para March.  I have recommended admission and patient (and family if present) agree with this plan. Admitting physician will place admission orders.   I reviewed all nursing notes, vitals, pertinent previous records and reviewed/interpreted all EKGs, lab and urine results, imaging (as available).   Repeat EKG reassuring without signs of ischemia, arrhythmia or interval abnormality.  Currently in a sinus rhythm. ____________________________________________   FINAL CLINICAL IMPRESSION(S) / ED DIAGNOSES  Final diagnoses:  COPD exacerbation (HCC)  Symptomatic anemia     ED Discharge Orders     None       *Please note:  Kerri Steele was evaluated in Emergency Department on 05/19/2021 for the symptoms described in the history of present illness. She was evaluated in the context of the global COVID-19 pandemic, which necessitated consideration that the patient might be at risk for infection with the SARS-CoV-2 virus that causes COVID-19. Institutional protocols and algorithms that pertain to the evaluation of patients at risk for COVID-19 are in a state of rapid change based on information released by regulatory bodies including the CDC and federal and state organizations. These policies and algorithms were followed during the patient's care in the ED.  Some ED evaluations and interventions may be delayed as a result of limited staffing during and the pandemic.*   Note:  This document was prepared using Dragon  voice recognition software and may include unintentional dictation errors.    Kerri Steele, Layla Maw, DO 05/19/21 0227    Kerri Steele, Layla Maw, DO 05/19/21 [phone]

## 2021-05-19 NOTE — ED Notes (Signed)
Admitting MD at bedside.

## 2021-05-19 NOTE — H&P (Signed)
History and Physical    Kerri Steele OZD:[id number] DOB: 1961-09-22 DOA: 05/19/2021  PCP: Erasmo Downer, NP   Patient coming from: home  I have personally briefly reviewed patient's old medical records in [facility]  Chief Complaint: anemia, sent by PCP  HPI: Kerri Steele is a 60 y.o. female with medical history significant for COPD on home O2 at 2 L, HTN, DM, paroxysmal A. fib on Eliquis and history of CVA and PFO awaiting surgical repair, recently seen in the ED with hemoglobin of 6.2, down from 12, transfused and discharged for hematology follow-up which was missed who was sent in by her PCP for another blood transfusion which she states was the reason for coming to the emergency room.  Patient states that she has been lightheaded for some time now, and sometimes needs to hold onto something when she walks and has shortness of breath on exertion.  She denies palpitations or lower extremity edema and denies chest pain.  She denies blood in the stool or black stool and denies any vomiting, blood in the urine vaginal bleeding. ED course: On arrival, BP 155/80 with pulse 98, respirations 24 O2 sat 98% on home flow rate of 2 L and afebrile.  Blood work significant for hemoglobin 7.8, up from 6.2 when seen in the ED 12 days prior and down from 12.711 months ago.  Iron 26, TIBC 479 and ferritin 35.  Folate 15.  Reticulocyte elevated at 4.7.  Troponin and BNP within normal limits.  Stool guaiac negative EKG, personally viewed and interpreted, normal sinus rhythm at 94 with no acute ST-T wave changes Imaging: Chest x-ray with no active disease  Patient treated in the ED with DuoNeb and Solu-Medrol.  Hospitalist consulted for admission.  Review of Systems: As per HPI otherwise all other systems on review of systems negative.    Past Medical History:  Diagnosis Date   Anginal pain (HCC)    Anxiety    Asthma    COPD (chronic obstructive pulmonary disease) (HCC)    Depression    Diabetes  mellitus without complication (HCC)    GERD (gastroesophageal reflux disease)    Hypertension    Hypothyroidism    Pneumonia    Shortness of breath dyspnea    Sleep apnea     Past Surgical History:  Procedure Laterality Date   ANKLE SURGERY     BLADDER SURGERY     CARDIAC CATHETERIZATION Left 08/23/2015   Procedure: Left Heart Cath and Coronary Angiography;  Surgeon: Dalia Heading, MD;  Location: [facility] INVASIVE CV LAB;  Service: Cardiovascular;  Laterality: Left;   ESOPHAGEAL DILATION     LOOP RECORDER INSERTION N/A 06/01/2020   Procedure: LOOP RECORDER INSERTION;  Surgeon: Marcina Millard, MD;  Location: [facility] INVASIVE CV LAB;  Service: Cardiovascular;  Laterality: N/A;   TEE WITHOUT CARDIOVERSION N/A 06/01/2020   Procedure: TRANSESOPHAGEAL ECHOCARDIOGRAM (TEE);  Surgeon: Lamar Blinks, MD;  Location: [facility] ORS;  Service: Cardiovascular;  Laterality: N/A;   TUBAL LIGATION     WRIST SURGERY       reports that she has quit smoking. She has never used smokeless tobacco. She reports current drug use. She reports that she does not drink alcohol.  Allergies  Allergen Reactions   Metformin And Related Other (See Comments)    migraine   Morphine And Related Hives   Other Other (See Comments)    Latex allergy   Sulfa Antibiotics Other (See Comments)    Family History  Problem Relation Age of Onset   Heart disease Father    Hypertension Mother    Diabetes Mother    Lupus Mother    Atrial fibrillation Mother       Prior to Admission medications   Medication Sig Start Date End Date Taking? Authorizing Provider  arformoterol (BROVANA) 15 MCG/2ML NEBU Take 15 mcg by nebulization 2 (two) times daily.    [provider]  atorvastatin (LIPITOR) 40 MG tablet Take 1 tablet (40 mg total) by mouth daily. 06/01/20 08/30/20  [place], Alvira Philips, DO  DULoxetine (CYMBALTA) 60 MG capsule Take 60 mg by mouth daily.    [provider]  glipiZIDE (GLUCOTROL XL) 10 MG 24 hr  tablet Take 10 mg by mouth daily with breakfast.    [provider]  HYDROcodone-acetaminophen (NORCO) 5-325 MG tablet Take 1 tablet by mouth every 6 (six) hours as needed for severe pain. 08/01/17   Merrily Brittle, MD  Ipratropium-Albuterol (COMBIVENT RESPIMAT) 20-100 MCG/ACT AERS respimat Inhale 1 puff into the lungs every 6 (six) hours.    [provider]  magnesium oxide (MAG-OX) 400 MG tablet Take 400 mg by mouth 2 (two) times daily.     [provider]  methocarbamol (ROBAXIN) 750 MG tablet Take 750 mg by mouth 3 (three) times daily.     [provider]  omeprazole (PRILOSEC) 20 MG capsule Take 20 mg by mouth daily.    [provider]  pregabalin (LYRICA) 150 MG capsule Take 150 mg by mouth 2 (two) times daily. 05/22/20   [provider]  sucralfate (CARAFATE) 1 g tablet Take 1 tablet (1 g total) by mouth 4 (four) times daily. 02/16/17 02/16/18  Jene Every, MD  tiotropium (SPIRIVA) 18 MCG inhalation capsule Place 18 mcg into inhaler and inhale daily.    [provider]  traMADol (ULTRAM) 50 MG tablet Take 50 mg by mouth every 6 (six) hours as needed for moderate pain.    [provider]  verapamil (CALAN-SR) 120 MG CR tablet Take 120 mg by mouth daily. 02/06/17   [provider]    Physical Exam: Vitals:   05/19/21 0116 05/19/21 0200 05/19/21 0230 05/19/21 0300  BP: 133/81 (!) 162/75 (!) 144/82 (!) 159/75  Pulse: 74 65 71 72  Resp: 20  15 19   Temp:      TempSrc:      SpO2: 100% 100% 100% 100%  Weight:      Height:         Vitals:   05/19/21 0116 05/19/21 0200 05/19/21 0230 05/19/21 0300  BP: 133/81 (!) 162/75 (!) 144/82 (!) 159/75  Pulse: 74 65 71 72  Resp: 20  15 19   Temp:      TempSrc:      SpO2: 100% 100% 100% 100%  Weight:      Height:          Constitutional: Alert and oriented x 3 . Not in any apparent distress HEENT:      Head: Normocephalic and atraumatic.         Eyes: PERLA,  EOMI, Conjunctivae are normal. Sclera is non-icteric.       Mouth/Throat: Mucous membranes are moist.       Neck: Supple with no signs of meningismus. Cardiovascular: Regular rate and rhythm. No murmurs, gallops, or rubs. 2+ symmetrical distal pulses are present . No JVD. No LE edema Respiratory: Respiratory effort normal .. Mild wheezing with decreased air movement Gastrointestinal: Soft, non tender,  and non distended with positive bowel sounds.  Genitourinary: No CVA tenderness. Musculoskeletal: Nontender with normal range of motion in all extremities. No cyanosis, or erythema of extremities. Neurologic:  Face is symmetric. Moving all extremities. No gross focal neurologic deficits . Skin: Skin is warm, dry.  No rash or ulcers Psychiatric: Mood and affect are normal    Labs on Admission: I have personally reviewed following labs and imaging studies  CBC: Recent Labs  Lab 05/18/21 1938  WBC 11.9*  HGB 7.8*  HCT 29.6*  MCV 67.4*  PLT 445*   Basic Metabolic Panel: Recent Labs  Lab 05/18/21 1938  NA 139  K 4.4  CL 104  CO2 29  GLUCOSE 130*  BUN 17  CREATININE 0.92  CALCIUM 9.2   GFR: Estimated Creatinine Clearance: 58.3 mL/min (by C-G formula based on SCr of 0.92 mg/dL). Liver Function Tests: No results for input(s): AST, ALT, ALKPHOS, BILITOT, PROT, ALBUMIN in the last 168 hours. No results for input(s): LIPASE, AMYLASE in the last 168 hours. No results for input(s): AMMONIA in the last 168 hours. Coagulation Profile: No results for input(s): INR, PROTIME in the last 168 hours. Cardiac Enzymes: No results for input(s): CKTOTAL, CKMB, CKMBINDEX, TROPONINI in the last 168 hours. BNP (last 3 results) No results for input(s): PROBNP in the last 8760 hours. HbA1C: No results for input(s): HGBA1C in the last 72 hours. CBG: No results for input(s): GLUCAP in the last 168 hours. Lipid Profile: No results for input(s): CHOL, HDL, LDLCALC, TRIG, CHOLHDL, LDLDIRECT in the  last 72 hours. Thyroid Function Tests: No results for input(s): TSH, T4TOTAL, FREET4, T3FREE, THYROIDAB in the last 72 hours. Anemia Panel: Recent Labs    05/19/21 0217  FOLATE 15.0  FERRITIN 35  TIBC 479*  IRON 26*  RETICCTPCT 4.7*   Urine analysis:    Component Value Date/Time   COLORURINE COLORLESS (A) 09/06/2019 0917   APPEARANCEUR CLEAR (A) 09/06/2019 0917   LABSPEC 1.030 09/06/2019 0917   PHURINE 7.0 09/06/2019 0917   GLUCOSEU NEGATIVE 09/06/2019 0917   HGBUR NEGATIVE 09/06/2019 0917   BILIRUBINUR NEGATIVE 09/06/2019 0917   KETONESUR NEGATIVE 09/06/2019 0917   PROTEINUR NEGATIVE 09/06/2019 0917   NITRITE NEGATIVE 09/06/2019 0917   LEUKOCYTESUR NEGATIVE 09/06/2019 0917    Radiological Exams on Admission: DG Chest Portable 1 View  Result Date: 05/18/2021 CLINICAL DATA:  Chest pain and shortness of breath EXAM: PORTABLE CHEST 1 VIEW COMPARISON:  05/29/2020 FINDINGS: The heart size and mediastinal contours are within normal limits. Both lungs are clear. The visualized skeletal structures are unremarkable. IMPRESSION: No active disease. Electronically Signed   By: Alcide Clever M.D.   On: 05/18/2021 19:50     Assessment/Plan 60 year old female with history of COPD on home O2 at 2 L, HTN, DM, paroxysmal A. fib on Eliquis and history of CVA and PFO awaiting surgical repair, recently transfused 1 unit PRBC in the ED for hemoglobin of 6.2,  sent in by her PCP for symptomatic anemia.    Symptomatic anemia, in the setting of chronic Eliquis - Hemoglobin 7.8 up from 6.2 after 1 unit PRBC on 6/27 - Stool guaiac remains negative and no evidence of active bleeding - Patient was unable to keep hematology appointment - Serial H&H and transfuse if necessary - We will hold Eliquis for now - Consider inpatient hematology appointment - GI consult requested    COPD exacerbation, mild Chronic respiratory failure -Patient was wheezing in the ED and got Solu-Medrol and duo nebs -  Oral  prednisone - DuoNebs as needed - Continue supplemental oxygen  Chronic atrial fibrillation - Hold Eliquis - Continue verapamil    Type 2 diabetes mellitus with hyperlipidemia (HCC) - Sliding scale insulin coverage    DVT prophylaxis: SCDs Code Status: full code  Family Communication:  none  Disposition Plan: Back to previous home environment Consults called: GI Status: Observation    Andris BaumannHazel V Gissel Keilman MD [facility]     05/19/2021, 3:41 AM

## 2021-05-19 NOTE — ED Notes (Signed)
Spoke with Sue Lush, RN who advises that she is ready for the patient.  Transport notified.

## 2021-05-19 NOTE — ED Notes (Signed)
Per blood bank, pt has rare antibodies and there will be delay in getting blood ready.

## 2021-05-19 NOTE — ED Notes (Signed)
Pt presents via triage for c/o low Hgb. Per pt, she had a blood transfusion last week and was following up with her PCP due to SOB, fatigue, mild confusion. Pt sts she was referred to [facility]. Pt denies bloody vomit or stool or other signs of active bleeding. Pt reports hx of COPD and asthma, wears 2L [place] oxygen. Pt A&Ox4. Pt noted to have conversational dyspnea. Family member at bedside.

## 2021-05-19 NOTE — Consult Note (Signed)
[name], MD [facility]  [address], [address] [place], [place] [address] Phone: [phone] Fax : [phone] Consultation  Referring Provider:     Dr. Para March Primary Care Physician:  Erasmo Downer, NP Primary Gastroenterologist:  Gentry Fitz       Reason for Consultation:     Anemia  Date of Admission:  05/19/2021 Date of Consultation:  05/19/2021         HPI:   Kerri Steele is a 60 y.o. female Who came into emergency room with the last 24 hours for shortness of breath and has a history of COPD with hypertension diabetes, PFO and atrial Fibrillation on anticoagulation.  The patient is taking liquids.  The patient had a CVA in the past and came in with lightheadedness over the last few weeks.  She was noted to have a transfusion at the end of last month. When the patient was recently in the ER the patient's hemoglobin was 6.2 and at that time she received 1 unit of blood and was sent home.  The patient was now admitted with a hemoglobin of 7.8. The patient's chest x-ray on admission showed no active disease. This morning the patient's repeat hemoglobin was 7.1. Her iron studies show her iron to be low at 26 with an iron saturation of 5. It was reported at the patient's stool was Hemoccult negative.  The patient was supposed to see hematology but had no transportation and did not follow-up with that appointment.  Since admission the patient has had her Eliquis held. The patient denies seeing any black stools or bloody stools.  She also states that they tested her stools twice in the ER and not just once and she was told that both did not show any blood.  She was also told that she could not have her PFO treated until her anemia was under control.  The patient reports that she had an EGD and colonoscopy many years ago but no longer going to remember what it was.  She does report that it was over 10 years ago.  Past Medical History:  Diagnosis Date  . Anginal pain (HCC)   . Anxiety   .  Asthma   . COPD (chronic obstructive pulmonary disease) (HCC)   . Depression   . Diabetes mellitus without complication (HCC)   . GERD (gastroesophageal reflux disease)   . Hypertension   . Hypothyroidism   . Pneumonia   . Shortness of breath dyspnea   . Sleep apnea     Past Surgical History:  Procedure Laterality Date  . ANKLE SURGERY    . BLADDER SURGERY    . CARDIAC CATHETERIZATION Left 08/23/2015   Procedure: Left Heart Cath and Coronary Angiography;  Surgeon: Dalia Heading, MD;  Location: [facility] INVASIVE CV LAB;  Service: Cardiovascular;  Laterality: Left;  . ESOPHAGEAL DILATION    . LOOP RECORDER INSERTION N/A 06/01/2020   Procedure: LOOP RECORDER INSERTION;  Surgeon: Marcina Millard, MD;  Location: [facility] INVASIVE CV LAB;  Service: Cardiovascular;  Laterality: N/A;  . TEE WITHOUT CARDIOVERSION N/A 06/01/2020   Procedure: TRANSESOPHAGEAL ECHOCARDIOGRAM (TEE);  Surgeon: Lamar Blinks, MD;  Location: [facility] ORS;  Service: Cardiovascular;  Laterality: N/A;  . TUBAL LIGATION    . WRIST SURGERY      Prior to Admission medications   Medication Sig Start Date End Date Taking? Authorizing Provider  arformoterol (BROVANA) 15 MCG/2ML NEBU Take 15 mcg by nebulization 2 (two) times daily.    [provider]  atorvastatin (LIPITOR) 40 MG tablet Take 1 tablet (40 mg total) by mouth daily. 06/01/20 08/30/20  [place], Alvira Philips, DO  DULoxetine (CYMBALTA) 60 MG capsule Take 60 mg by mouth daily.    [provider]  glipiZIDE (GLUCOTROL XL) 10 MG 24 hr tablet Take 10 mg by mouth daily with breakfast.    [provider]  HYDROcodone-acetaminophen (NORCO) 5-325 MG tablet Take 1 tablet by mouth every 6 (six) hours as needed for severe pain. 08/01/17   Merrily Brittle, MD  Ipratropium-Albuterol (COMBIVENT RESPIMAT) 20-100 MCG/ACT AERS respimat Inhale 1 puff into the lungs every 6 (six) hours.    [provider]  magnesium oxide (MAG-OX) 400 MG tablet Take 400 mg by  mouth 2 (two) times daily.     [provider]  methocarbamol (ROBAXIN) 750 MG tablet Take 750 mg by mouth 3 (three) times daily.     [provider]  omeprazole (PRILOSEC) 20 MG capsule Take 20 mg by mouth daily.    [provider]  pregabalin (LYRICA) 150 MG capsule Take 150 mg by mouth 2 (two) times daily. 05/22/20   [provider]  sucralfate (CARAFATE) 1 g tablet Take 1 tablet (1 g total) by mouth 4 (four) times daily. 02/16/17 02/16/18  Jene Every, MD  tiotropium (SPIRIVA) 18 MCG inhalation capsule Place 18 mcg into inhaler and inhale daily.    [provider]  traMADol (ULTRAM) 50 MG tablet Take 50 mg by mouth every 6 (six) hours as needed for moderate pain.    [provider]  verapamil (CALAN-SR) 120 MG CR tablet Take 120 mg by mouth daily. 02/06/17   [provider]    Family History  Problem Relation Age of Onset  . Heart disease Father   . Hypertension Mother   . Diabetes Mother   . Lupus Mother   . Atrial fibrillation Mother      Social History   Tobacco Use  . Smoking status: Former    Pack years: 0.00  . Smokeless tobacco: Never  Substance Use Topics  . Alcohol use: No  . Drug use: Yes    Comment: prescribed tramadol, oxycodone,and robaxin    Allergies as of 05/18/2021 - Review Complete 05/18/2021  Allergen Reaction Noted  . Metformin and related Other (See Comments) 07/30/2016  . Morphine and related Hives 08/23/2015  . Other Other (See Comments) 10/09/2016  . Sulfa antibiotics Other (See Comments) 08/23/2015    Review of Systems:    All systems reviewed and negative except where noted in HPI.   Physical Exam:  Vital signs in last 24 hours: Temp:  [97.9 F (36.6 C)-98.6 F (37 C)] 98.6 F (37 C) (07/09 0806) Pulse Rate:  [65-98] 93 (07/09 0806) Resp:  [15-24] 18 (07/09 0806) BP: (122-162)/(66-82) 131/73 (07/09 0806) SpO2:  [98 %-100 %] 100 % (07/09 0806) Weight:  [70.3 kg] 70.3 kg (07/08  1933) Last BM Date: 05/18/21 (per pt.) General:   Pleasant, cooperative in NAD Head:  Normocephalic and atraumatic. Eyes:   No icterus.   Conjunctiva pink. PERRLA. Ears:  Normal auditory acuity. Neck:  Supple; no masses or thyroidomegaly Lungs: Respirations even and unlabored. Lungs clear to auscultation bilaterally.   No wheezes, crackles, or rhonchi.  Heart:  Regular rate and rhythm;  Without murmur, clicks, rubs or gallops Abdomen:  Soft, nondistended, nontender. Normal bowel sounds. No appreciable masses or hepatomegaly.  No rebound or guarding.  Rectal:  Not performed. Msk:  Symmetrical without  gross deformities.    Extremities:  Without edema, cyanosis or clubbing. Neurologic:  Alert and oriented x3;  grossly normal neurologically. Skin:  Intact without significant lesions or rashes. Cervical Nodes:  No significant cervical adenopathy. Psych:  Alert and cooperative. Normal affect.  LAB RESULTS: Recent Labs    05/18/21 1938 05/19/21 0447  WBC 11.9* 16.2*  HGB 7.8* 7.1*  HCT 29.6* 27.1*  PLT 445* 389   BMET Recent Labs    05/18/21 1938  NA 139  K 4.4  CL 104  CO2 29  GLUCOSE 130*  BUN 17  CREATININE 0.92  CALCIUM 9.2   LFT No results for input(s): PROT, ALBUMIN, AST, ALT, ALKPHOS, BILITOT, BILIDIR, IBILI in the last 72 hours. PT/INR No results for input(s): LABPROT, INR in the last 72 hours.  STUDIES: DG Chest Portable 1 View  Result Date: 05/18/2021 CLINICAL DATA:  Chest pain and shortness of breath EXAM: PORTABLE CHEST 1 VIEW COMPARISON:  05/29/2020 FINDINGS: The heart size and mediastinal contours are within normal limits. Both lungs are clear. The visualized skeletal structures are unremarkable. IMPRESSION: No active disease. Electronically Signed   By: Alcide Clever M.D.   On: 05/18/2021 19:50      Impression / Plan:   Assessment: Principal Problem:   COPD exacerbation (HCC) Active Problems:   PFO (patent foramen ovale)   Benign essential HTN   Type  2 diabetes mellitus with hyperlipidemia (HCC)   Symptomatic anemia   Kerri Steele is a 60 y.o. y/o female with who was admitted with symptomatic anemia.  The patient's hemoglobin 11 months ago was 12.7 with a hemoglobin of 7.8 yesterday and down to 7.0 today.  The patient's ferritin was 35 with iron of 26 and saturation of 5%.  The patient's drop in hemoglobin overnight is likely due to hydration since there has been no sign of any further GI bleeding or recent GI bleeding.  Plan:  Due to the patient having iron deficiency anemia and symptomatic anemia she will be set up for an EGD and colonoscopy for tomorrow.  The patient has had no sign of any overt bleeding and it was reported that she had Hemoccult negative twice in the emergency department.  The patient will be kept n.p.o. after midnight and be started on a prep today for an EGD and colonoscopy for tomorrow.  The patient has been explained the plan agrees with it.  Thank you for involving me in the care of this patient.      LOS: 0 days   [name], MD, [facility] 05/19/2021, 8:20 AM,  Pager [phone] 7am-5pm  Check AMION for 5pm -7am coverage and on weekends   Note: This dictation was prepared with Dragon dictation along with smaller phrase technology. Any transcriptional errors that result from this process are unintentional.

## 2021-05-19 NOTE — ED Notes (Signed)
Contacted floor nurse for report.

## 2021-05-20 ENCOUNTER — Encounter
Admission: EM | Disposition: A | Discharge status: Home / Self Care | Payer: Self-pay | Source: Home / Self Care | Attending: Emergency Medicine

## 2021-05-20 ENCOUNTER — Observation Stay: Payer: Medicare Other | Admitting: Anesthesiology

## 2021-05-20 ENCOUNTER — Encounter: Payer: Self-pay | Admitting: Internal Medicine

## 2021-05-20 DIAGNOSIS — J441 Chronic obstructive pulmonary disease with (acute) exacerbation: Secondary | ICD-10-CM | POA: Diagnosis not present

## 2021-05-20 DIAGNOSIS — D500 Iron deficiency anemia secondary to blood loss (chronic): Secondary | ICD-10-CM | POA: Diagnosis not present

## 2021-05-20 HISTORY — PX: COLONOSCOPY WITH PROPOFOL: SHX5780

## 2021-05-20 HISTORY — PX: ESOPHAGOGASTRODUODENOSCOPY (EGD) WITH PROPOFOL: SHX5813

## 2021-05-20 LAB — GLUCOSE, CAPILLARY
Glucose-Capillary: 111 mg/dL — ABNORMAL HIGH (ref 70–99)
Glucose-Capillary: 139 mg/dL — ABNORMAL HIGH (ref 70–99)
Glucose-Capillary: 126 mg/dL — ABNORMAL HIGH (ref 70–99)

## 2021-05-20 LAB — BASIC METABOLIC PANEL
Anion gap: 6 (ref 5–15)
BUN: 16 mg/dL (ref 6–20)
CO2: 27 mmol/L (ref 22–32)
Calcium: 9.3 mg/dL (ref 8.9–10.3)
Chloride: 109 mmol/L (ref 98–111)
Creatinine, Ser: 0.76 mg/dL (ref 0.44–1.00)
GFR, Estimated: >60 mL/min (ref >60)
Glucose, Bld: 108 mg/dL — ABNORMAL HIGH (ref 70–99)
Potassium: 3.5 mmol/L (ref 3.5–5.1)
Sodium: 142 mmol/L (ref 135–145)

## 2021-05-20 LAB — CBC
HCT: 34.1 % — ABNORMAL LOW (ref 36.0–46.0)
Hemoglobin: 9.6 g/dL — ABNORMAL LOW (ref 12.0–15.0)
MCH: 19.9 pg — ABNORMAL LOW (ref 26.0–34.0)
MCHC: 28.2 g/dL — ABNORMAL LOW (ref 30.0–36.0)
MCV: 70.7 fL — ABNORMAL LOW (ref 80.0–100.0)
Platelets: 401 10*3/uL — ABNORMAL HIGH (ref 150–400)
RBC: 4.82 MIL/uL (ref 3.87–5.11)
RDW: 34.7 % — ABNORMAL HIGH (ref 11.5–15.5)
WBC: 12.4 10*3/uL — ABNORMAL HIGH (ref 4.0–10.5)
nRBC: 0.2 % (ref 0.0–0.2)

## 2021-05-20 LAB — HEMOGLOBIN AND HEMATOCRIT, BLOOD
HCT: 31.7 % — ABNORMAL LOW (ref 36.0–46.0)
Hemoglobin: 9 g/dL — ABNORMAL LOW (ref 12.0–15.0)

## 2021-05-20 SURGERY — COLONOSCOPY WITH PROPOFOL
Anesthesia: General

## 2021-05-20 MED ORDER — PROPOFOL 10 MG/ML IV BOLUS
INTRAVENOUS | Status: DC | PRN
  Administered 2021-05-20: 80 mg via INTRAVENOUS
  Administered 2021-05-20 (×5): 30 mg via INTRAVENOUS

## 2021-05-20 MED ORDER — PREDNISONE 20 MG PO TABS: 40.0000 mg | ORAL_TABLET | Freq: Every day | ORAL | 0 refills | Status: AC

## 2021-05-20 MED ORDER — PANTOPRAZOLE SODIUM 40 MG PO TBEC: 40.0000 mg | DELAYED_RELEASE_TABLET | Freq: Two times a day (BID) | ORAL | 11 refills | Status: DC

## 2021-05-20 MED ORDER — IPRATROPIUM-ALBUTEROL 0.5-2.5 (3) MG/3ML IN SOLN: 3.0000 mL | Freq: Four times a day (QID) | RESPIRATORY_TRACT | Status: DC | PRN

## 2021-05-20 MED ORDER — LIDOCAINE HCL (CARDIAC) PF 100 MG/5ML IV SOSY
PREFILLED_SYRINGE | INTRAVENOUS | Status: DC | PRN
  Administered 2021-05-20: 100 mg via INTRAVENOUS

## 2021-05-20 MED ORDER — SODIUM CHLORIDE 0.9 % IV SOLN: Freq: Once | INTRAVENOUS | Status: AC

## 2021-05-20 MED ORDER — SODIUM CHLORIDE 0.9 % IV SOLN: INTRAVENOUS | Status: DC | PRN

## 2021-05-20 MED ORDER — PROPOFOL 500 MG/50ML IV EMUL
INTRAVENOUS | Status: DC | PRN
  Administered 2021-05-20: 140 ug/kg/min via INTRAVENOUS

## 2021-05-20 MED ORDER — POTASSIUM CHLORIDE CRYS ER 20 MEQ PO TBCR: 20.0000 meq | EXTENDED_RELEASE_TABLET | Freq: Once | ORAL | Status: DC

## 2021-05-20 NOTE — Anesthesia Postprocedure Evaluation (Signed)
Anesthesia Post Note  Patient: Dior Dominik  Procedure(s) Performed: COLONOSCOPY WITH PROPOFOL ESOPHAGOGASTRODUODENOSCOPY (EGD) WITH PROPOFOL  Patient location during evaluation: PACU Anesthesia Type: General Level of consciousness: awake and alert Pain management: pain level controlled Vital Signs Assessment: post-procedure vital signs reviewed and stable Respiratory status: spontaneous breathing, nonlabored ventilation, respiratory function stable and patient connected to nasal cannula oxygen Cardiovascular status: blood pressure returned to baseline and stable Postop Assessment: no apparent nausea or vomiting Anesthetic complications: no   No notable events documented.   Last Vitals:  Vitals:   05/20/21 1145 05/20/21 1200  BP: (!) 111/58 120/62  Pulse: 84 83  Resp: 18 (!) 22  Temp: 36.6 C   SpO2: 100% 100%    Last Pain:  Vitals:   05/20/21 1200  TempSrc:   PainSc: 0-No pain                 Cleda Mccreedy Corie Vavra

## 2021-05-20 NOTE — Anesthesia Preprocedure Evaluation (Signed)
Anesthesia Evaluation  Patient identified by MRN, date of birth, ID band Patient awake    Reviewed: Allergy & Precautions, NPO status , Patient's Chart, lab work & pertinent test results  History of Anesthesia Complications Negative for: history of anesthetic complications  Airway Mallampati: III  TM Distance: >3 FB Neck ROM: limited    Dental  (+) Chipped, Missing, Poor Dentition   Pulmonary shortness of breath and with exertion, asthma , sleep apnea , pneumonia, COPD,  oxygen dependent, former smoker,    Pulmonary exam normal        Cardiovascular hypertension, (-) anginaNormal cardiovascular exam     Neuro/Psych PSYCHIATRIC DISORDERS CVA (Right side and vision), Residual Symptoms negative neurological ROS     GI/Hepatic Neg liver ROS, GERD  Medicated and Controlled,  Endo/Other  diabetesHypothyroidism   Renal/GU negative Renal ROS  negative genitourinary   Musculoskeletal   Abdominal   Peds  Hematology negative hematology ROS (+)   Anesthesia Other Findings Past Medical History: No date: Anginal pain (HCC) No date: Anxiety No date: Asthma No date: COPD (chronic obstructive pulmonary disease) (HCC) No date: Depression No date: Diabetes mellitus without complication (HCC) No date: GERD (gastroesophageal reflux disease) No date: Hypertension No date: Hypothyroidism No date: Pneumonia No date: Shortness of breath dyspnea No date: Sleep apnea  Past Surgical History: No date: ANKLE SURGERY No date: BLADDER SURGERY 08/23/2015: CARDIAC CATHETERIZATION; Left     Comment:  Procedure: Left Heart Cath and Coronary Angiography;                Surgeon: Dalia Heading, MD;  Location: [facility] INVASIVE CV               LAB;  Service: Cardiovascular;  Laterality: Left; No date: ESOPHAGEAL DILATION 06/01/2020: LOOP RECORDER INSERTION; N/A     Comment:  Procedure: LOOP RECORDER INSERTION;  Surgeon: Marcina Millard, MD;  Location: [facility] INVASIVE CV LAB;  Service:              Cardiovascular;  Laterality: N/A; 06/01/2020: TEE WITHOUT CARDIOVERSION; N/A     Comment:  Procedure: TRANSESOPHAGEAL ECHOCARDIOGRAM (TEE);                Surgeon: Lamar Blinks, MD;  Location: [facility] ORS;                Service: Cardiovascular;  Laterality: N/A; No date: TUBAL LIGATION No date: WRIST SURGERY  BMI    Body Mass Index: 30.27 kg/m      Reproductive/Obstetrics negative OB ROS                             Anesthesia Physical Anesthesia Plan  ASA: 3  Anesthesia Plan: General   Post-op Pain Management:    Induction: Intravenous  PONV Risk Score and Plan: Propofol infusion and TIVA  Airway Management Planned: Natural Airway and Nasal Cannula  Additional Equipment:   Intra-op Plan:   Post-operative Plan:   Informed Consent: I have reviewed the patients History and Physical, chart, labs and discussed the procedure including the risks, benefits and alternatives for the proposed anesthesia with the patient or authorized representative who has indicated his/her understanding and acceptance.     Dental Advisory Given  Plan Discussed with: Anesthesiologist, CRNA and Surgeon  Anesthesia Plan Comments: (Patient consented for risks of anesthesia including but not limited to:  -  adverse reactions to medications - risk of airway placement if required - damage to eyes, teeth, lips or other oral mucosa - nerve damage due to positioning  - sore throat or hoarseness - Damage to heart, brain, nerves, lungs, other parts of body or loss of life  Patient voiced understanding.)        Anesthesia Quick Evaluation

## 2021-05-20 NOTE — Plan of Care (Signed)
  Problem: Education: Goal: Knowledge of General Education information will improve Description: Including pain rating scale, medication(s)/side effects and non-pharmacologic comfort measures 05/20/2021 1700 by Gerarda Gunther, RN Outcome: Adequate for Discharge 05/20/2021 0801 by Gerarda Gunther, RN Outcome: Progressing   Problem: Health Behavior/Discharge Planning: Goal: Ability to manage health-related needs will improve 05/20/2021 1700 by Gerarda Gunther, RN Outcome: Adequate for Discharge 05/20/2021 0801 by Gerarda Gunther, RN Outcome: Progressing   Problem: Clinical Measurements: Goal: Ability to maintain clinical measurements within normal limits will improve 05/20/2021 1700 by Gerarda Gunther, RN Outcome: Adequate for Discharge 05/20/2021 0801 by Gerarda Gunther, RN Outcome: Progressing Goal: Will remain free from infection 05/20/2021 1700 by Gerarda Gunther, RN Outcome: Adequate for Discharge 05/20/2021 0801 by Gerarda Gunther, RN Outcome: Progressing Goal: Diagnostic test results will improve 05/20/2021 1700 by Gerarda Gunther, RN Outcome: Adequate for Discharge 05/20/2021 0801 by Gerarda Gunther, RN Outcome: Progressing Goal: Respiratory complications will improve 05/20/2021 1700 by Gerarda Gunther, RN Outcome: Adequate for Discharge 05/20/2021 0801 by Gerarda Gunther, RN Outcome: Progressing Goal: Cardiovascular complication will be avoided 05/20/2021 1700 by Gerarda Gunther, RN Outcome: Adequate for Discharge 05/20/2021 0801 by Gerarda Gunther, RN Outcome: Progressing   Problem: Activity: Goal: Risk for activity intolerance will decrease 05/20/2021 1700 by Gerarda Gunther, RN Outcome: Adequate for Discharge 05/20/2021 0801 by Gerarda Gunther, RN Outcome: Progressing   Problem: Nutrition: Goal: Adequate nutrition will be maintained 05/20/2021 1700 by Gerarda Gunther, RN Outcome: Adequate for Discharge 05/20/2021 0801 by Gerarda Gunther, RN Outcome: Progressing   Problem:  Coping: Goal: Level of anxiety will decrease 05/20/2021 1700 by Gerarda Gunther, RN Outcome: Adequate for Discharge 05/20/2021 0801 by Gerarda Gunther, RN Outcome: Progressing   Problem: Elimination: Goal: Will not experience complications related to bowel motility 05/20/2021 1700 by Gerarda Gunther, RN Outcome: Adequate for Discharge 05/20/2021 0801 by Gerarda Gunther, RN Outcome: Progressing Goal: Will not experience complications related to urinary retention 05/20/2021 1700 by Gerarda Gunther, RN Outcome: Adequate for Discharge 05/20/2021 0801 by Gerarda Gunther, RN Outcome: Progressing   Problem: Pain Managment: Goal: General experience of comfort will improve 05/20/2021 1700 by Gerarda Gunther, RN Outcome: Adequate for Discharge 05/20/2021 0801 by Gerarda Gunther, RN Outcome: Progressing   Problem: Safety: Goal: Ability to remain free from injury will improve 05/20/2021 1700 by Gerarda Gunther, RN Outcome: Adequate for Discharge 05/20/2021 0801 by Gerarda Gunther, RN Outcome: Progressing   Problem: Skin Integrity: Goal: Risk for impaired skin integrity will decrease 05/20/2021 1700 by Gerarda Gunther, RN Outcome: Adequate for Discharge 05/20/2021 0801 by Gerarda Gunther, RN Outcome: Progressing

## 2021-05-20 NOTE — Transfer of Care (Signed)
Immediate Anesthesia Transfer of Care Note  Patient: Kerri Steele  Procedure(s) Performed: COLONOSCOPY WITH PROPOFOL ESOPHAGOGASTRODUODENOSCOPY (EGD) WITH PROPOFOL  Patient Location: PACU  Anesthesia Type:General  Level of Consciousness: sedated  Airway & Oxygen Therapy: Patient Spontanous Breathing  Post-op Assessment: Report given to RN and Post -op Vital signs reviewed and stable  Post vital signs: Reviewed and stable  Last Vitals:  Vitals Value Taken Time  BP 111/58 05/20/21 1145  Temp 36.6 C 05/20/21 1145  Pulse 78 05/20/21 1145  Resp 21 05/20/21 1145  SpO2 100 % 05/20/21 1145  Vitals shown include unvalidated device data.  Last Pain:  Vitals:   05/20/21 0807  TempSrc: Oral  PainSc:          Complications: No notable events documented.

## 2021-05-20 NOTE — Plan of Care (Signed)

## 2021-05-20 NOTE — Op Note (Signed)
[facility] Gastroenterology Patient Name: Kerri Steele Procedure Date: 05/20/2021 11:10 AM MRN: [id number] Account #: [id number] Date of Birth: 06-Sep-1961 Admit Type: Outpatient Age: 60 Room: [facility] ENDO ROOM 4 Gender: Female Note Status: Finalized Procedure:             Upper GI endoscopy Indications:           Iron deficiency anemia Providers:             [name] MD, MD Referring MD:          Erasmo Downer (Referring MD) Medicines:             Propofol per Anesthesia Complications:         No immediate complications. Procedure:             Pre-Anesthesia Assessment:                        - Prior to the procedure, a History and Physical was                         performed, and patient medications and allergies were                         reviewed. The patient's tolerance of previous                         anesthesia was also reviewed. The risks and benefits                         of the procedure and the sedation options and risks                         were discussed with the patient. All questions were                         answered, and informed consent was obtained. Prior                         Anticoagulants: The patient has taken no previous                         anticoagulant or antiplatelet agents. ASA Grade                         Assessment: III - A patient with severe systemic                         disease. After reviewing the risks and benefits, the                         patient was deemed in satisfactory condition to                         undergo the procedure.                        After obtaining informed consent, the endoscope was  passed under direct vision. Throughout the procedure,                         the patient's blood pressure, pulse, and oxygen                         saturations were monitored continuously. The Endoscope                         was introduced through the mouth, and  advanced to the                         second part of duodenum. The upper GI endoscopy was                         accomplished without difficulty. The patient tolerated                         the procedure well. Findings:      The examined esophagus was normal.      The stomach was normal.      The examined duodenum was normal. Impression:            - Normal esophagus.                        - Normal stomach.                        - Normal examined duodenum.                        - No specimens collected. Recommendation:        - Return patient to hospital ward for ongoing care.                        - Resume previous diet.                        - Continue present medications.                        - Perform a colonoscopy today. Procedure Code(s):     --- Professional ---                        [phone], Esophagogastroduodenoscopy, flexible,                         transoral; diagnostic, including collection of                         specimen(s) by brushing or washing, when performed                         (separate procedure) Diagnosis Code(s):     --- Professional ---                        D50.9, Iron deficiency anemia, unspecified CPT copyright 2019 American Medical Association. All rights reserved. The codes documented in this report are preliminary and upon coder review may  be revised to  meet current compliance requirements. [name] MD, MD 05/20/2021 11:29:57 AM This report has been signed electronically. Number of Addenda: 0 Note Initiated On: 05/20/2021 11:10 AM Estimated Blood Loss:  Estimated blood loss: none.      [facility]

## 2021-05-20 NOTE — Discharge Summary (Signed)
Physician Discharge Summary  Kerri Steele KCL:[id number] DOB: Oct 27, 1961 DOA: 05/19/2021  PCP: Kerri Downer, NP  Admit date: 05/19/2021 Discharge date: 05/20/2021  Admitted From: Home Disposition:  Home  Recommendations for Outpatient Follow-up:  Follow up with PCP - appointment is scheduled on 7/12.  Repeat HH at this appointment. Follow up with Hematology regarding management of chronic anemia and clearance for PFO surgical repair. Please follow with GI Dr. Servando Snare regarding Video Capsule Endoscopy.  Home Health:None Equipment/Devices: None  Discharge Condition: Stable Code Status:   Code Status: Full Code Diet recommendation:  Diet Order             Diet regular Room service appropriate? Yes; Fluid consistency: Thin  Diet effective now           Diet - low sodium heart healthy                    Brief/Interim Summary:  60 year old female with PMH of HTN, DM2, COPD on 2L homoe oxygen, PAF on Eliquis, History of CVA and PFO awaiting surgical repair, and Chronic Anemia of unknown etiology who was sent to the ED by PCP due to dizziness and lightheadedness.  On 6/27, patient received 1 unit pRBC for Hb <7 g/dL.  HH is 7.1/27.  Reticulocyte count is 4.7.  BUN  Cr are stable.  There is no acute bleeding.  05/19/21 iron saturation was 5%.  Chronic Iron Deficiency Anemia:    The patient got 1 unit of blood and hemoglobin improved from 7.1 g/dL to 9.6 g/dL.    She had an EGD and colonoscopy without any sign of any active bleeding or cause of the patient's anemia.  There were no ulcers or polyps seen.  GI will perform video capsule endoscopy outpatient to evaluate for an obscure GI bleed.    Patient has a family history of anemia so this may be hereditary anemia - Factor V Leiden vs other.  She will follow up with Hematology at [facility] to initiate regular IV Venofer infusions and manage significant anemia symptoms.  She will discuss clearance for her PFO surgical repair.  Patient needs  to continue Eliquis (at low dose 2.5 mg BID) for stroke prevention even though she is anemia - she will remain at risk of her hemoglobin dropping again.  If she develops symptoms of dizziness or anemia (which will likely happen), Kerri Steele was advised to return to the ER for 1 unit pRBC.    Acute on Chronic COPD:  Patient follows with Pulmonologist Dr. Karna Christmas and is on Chronic Prednisone 5 mg daily. - She received 2 days of steroids while inpatient.  She was discharged on Prednisone 40 mg daily x 3 days to complete a 5 day course.  After 3 days, she will resume her chronic Prednisone 5 mg daily and follow up with Dr. Karna Christmas.  Discharge Diagnoses:  Principal Problem:   COPD exacerbation (HCC) Active Problems:   PFO (patent foramen ovale)   Benign essential HTN   Type 2 diabetes mellitus with hyperlipidemia (HCC)   Symptomatic anemia   Iron deficiency anemia due to chronic blood loss   Consults: Gastroenterology  Subjective: Patient was discharged in stable condition. Discharge Exam: Vitals:   05/20/21 1200 05/20/21 1530  BP: 120/62 132/69  Pulse: 83 82  Resp: (!) 22 17  Temp:  98.3 F (36.8 C)  SpO2: 100% 99%   General: Pt is alert, awake, not in acute distress Cardiovascular: RRR, S1/S2 +,  no rubs, no gallops Respiratory: CTA bilaterally, no wheezing, no rhonchi Abdominal: Soft, NT, ND, bowel sounds + Extremities: no edema, no cyanosis  Discharge Instructions  Discharge Instructions     Ambulatory referral to Gastroenterology   Complete by: As directed    Ambulatory referral to Hematology / Oncology   Complete by: As directed    Diet - low sodium heart healthy   Complete by: As directed    Discharge instructions   Complete by: As directed    It is ok to continue Eliquis 2.5 mg twice daily.  Repeat a hemoglobin on 7/12 with your primary care physician.  Please take Protonix 40 mg twice daily.  Follow up with Gastroenterologist Dr. Servando SnareWohl in 1-2 weeks for a Video  Capsule Endoscopy.  Please follow up with a Hematologist to manage your Chronic Anemia.  [facility] [place].  Please discuss with her how to get cleared for surgery for the PFO.  Please take Prednisone 40 mg daily for 3 days to treat your COPD.  After this continue your Prednisone 5 mg daily prescribed by your Pulmonologist Dr. Karna ChristmasAleskerov.   Increase activity slowly   Complete by: As directed       Allergies as of 05/20/2021       Reactions   Metformin And Related Other (See Comments)   migraine   Morphine And Related Hives   Other Other (See Comments)   Latex allergy   Sulfa Antibiotics Other (See Comments)        Medication List     STOP taking these medications    omeprazole 20 MG capsule Commonly known as: PRILOSEC       TAKE these medications    arformoterol 15 MCG/2ML Nebu Commonly known as: BROVANA Take 15 mcg by nebulization 2 (two) times daily.   atorvastatin 40 MG tablet Commonly known as: LIPITOR Take 1 tablet (40 mg total) by mouth daily.   Combivent Respimat 20-100 MCG/ACT Aers respimat Generic drug: Ipratropium-Albuterol Inhale 1 puff into the lungs every 6 (six) hours.   DULoxetine 60 MG capsule Commonly known as: CYMBALTA Take 60 mg by mouth daily.   glipiZIDE 10 MG 24 hr tablet Commonly known as: GLUCOTROL XL Take 10 mg by mouth daily with breakfast.   HYDROcodone-acetaminophen 5-325 MG tablet Commonly known as: Norco Take 1 tablet by mouth every 6 (six) hours as needed for severe pain.   magnesium oxide 400 MG tablet Commonly known as: MAG-OX Take 400 mg by mouth 2 (two) times daily.   methocarbamol 750 MG tablet Commonly known as: ROBAXIN Take 750 mg by mouth 3 (three) times daily.   pantoprazole 40 MG tablet Commonly known as: Protonix Take 1 tablet (40 mg total) by mouth 2 (two) times daily.   predniSONE 20 MG tablet Commonly known as: DELTASONE Take 2  tablets (40 mg total) by mouth daily with breakfast for 3 days. Start taking on: May 21, 2021   pregabalin 150 MG capsule Commonly known as: LYRICA Take 150 mg by mouth 2 (two) times daily.   sucralfate 1 g tablet Commonly known as: Carafate Take 1 tablet (1 g total) by mouth 4 (four) times daily.   tiotropium 18 MCG inhalation capsule Commonly known as: SPIRIVA Place 18 mcg into inhaler and inhale daily.   traMADol 50 MG tablet Commonly known as: ULTRAM Take 50 mg by mouth every 6 (six) hours as needed for moderate pain.   verapamil  120 MG CR tablet Commonly known as: CALAN-SR Take 120 mg by mouth daily.        Allergies  Allergen Reactions   Metformin And Related Other (See Comments)    migraine   Morphine And Related Hives   Other Other (See Comments)    Latex allergy   Sulfa Antibiotics Other (See Comments)    The results of significant diagnostics from this hospitalization (including imaging, microbiology, ancillary and laboratory) are listed below for reference.    Microbiology: Recent Results (from the past 240 hour(s))  Resp Panel by RT-PCR (Flu A&B, Covid) Nasopharyngeal Swab     Status: None   Collection Time: 05/19/21  2:17 AM   Specimen: Nasopharyngeal Swab; Nasopharyngeal(NP) swabs in vial transport medium  Result Value Ref Range Status   SARS Coronavirus 2 by RT PCR NEGATIVE NEGATIVE Final    Comment: (NOTE) SARS-CoV-2 target nucleic acids are NOT DETECTED.  The SARS-CoV-2 RNA is generally detectable in upper respiratory specimens during the acute phase of infection. The lowest concentration of SARS-CoV-2 viral copies this assay can detect is 138 copies/mL. A negative result does not preclude SARS-Cov-2 infection and should not be used as the sole basis for treatment or other patient management decisions. A negative result may occur with  improper specimen collection/handling, submission of specimen other than nasopharyngeal swab, presence of  viral mutation(s) within the areas targeted by this assay, and inadequate number of viral copies(<138 copies/mL). A negative result must be combined with clinical observations, patient history, and epidemiological information. The expected result is Negative.  Fact Sheet for Patients:  BloggerCourse.com  Fact Sheet for Healthcare Providers:  SeriousBroker.it  This test is no t yet approved or cleared by the [place] FDA and  has been authorized for detection and/or diagnosis of SARS-CoV-2 by FDA under an Emergency Use Authorization (EUA). This EUA will remain  in effect (meaning this test can be used) for the duration of the COVID-19 declaration under Section 564(b)(1) of the Act, 21 U.S.C.section 360bbb-3(b)(1), unless the authorization is terminated  or revoked sooner.       Influenza A by PCR NEGATIVE NEGATIVE Final   Influenza B by PCR NEGATIVE NEGATIVE Final    Comment: (NOTE) The Xpert Xpress SARS-CoV-2/FLU/RSV plus assay is intended as an aid in the diagnosis of influenza from Nasopharyngeal swab specimens and should not be used as a sole basis for treatment. Nasal washings and aspirates are unacceptable for Xpert Xpress SARS-CoV-2/FLU/RSV testing.  Fact Sheet for Patients: BloggerCourse.com  Fact Sheet for Healthcare Providers: SeriousBroker.it  This test is not yet approved or cleared by the [place] FDA and has been authorized for detection and/or diagnosis of SARS-CoV-2 by FDA under an Emergency Use Authorization (EUA). This EUA will remain in effect (meaning this test can be used) for the duration of the COVID-19 declaration under Section 564(b)(1) of the Act, 21 U.S.C. section 360bbb-3(b)(1), unless the authorization is terminated or revoked.  Performed at [facility] [address] [place], [place] [address]     Procedures/Studies: DG  Chest Portable 1 View  Result Date: 05/18/2021 CLINICAL DATA:  Chest pain and shortness of breath EXAM: PORTABLE CHEST 1 VIEW COMPARISON:  05/29/2020 FINDINGS: The heart size and mediastinal contours are within normal limits. Both lungs are clear. The visualized skeletal structures are unremarkable. IMPRESSION: No active disease. Electronically Signed   By: Alcide Clever Kerri.D.   On: 05/18/2021 19:50    Labs: BNP (last 3 results) Recent Labs  05/19/21 0217  BNP 35.8   Basic Metabolic Panel: Recent Labs  Lab 05/18/21 1938 05/20/21 0436  NA 139 142  K 4.4 3.5  CL 104 109  CO2 29 27  GLUCOSE 130* 108*  BUN 17 16  CREATININE 0.92 0.76  CALCIUM 9.2 9.3    CBC: Recent Labs  Lab 05/18/21 1938 05/19/21 0447 05/19/21 0940 05/20/21 0436 05/20/21 1436  WBC 11.9* 16.2*  --  12.4*  --   HGB 7.8* 7.1* 7.0* 9.6* 9.0*  HCT 29.6* 27.1* 27.3* 34.1* 31.7*  MCV 67.4* 68.1*  --  70.7*  --   PLT 445* 389  --  401*  --     CBG: Recent Labs  Lab 05/19/21 1636 05/19/21 2040 05/20/21 0906 05/20/21 1117 05/20/21 1240  GLUCAP 142* 132* 111* 139* 126*    Hgb A1c Recent Labs    05/19/21 0447  HGBA1C 5.5    Anemia work up Recent Labs    05/19/21 0217  VITAMINB12 1,132*  FOLATE 15.0  FERRITIN 35  TIBC 479*  IRON 26*  RETICCTPCT 4.7*   Urinalysis    Component Value Date/Time   COLORURINE COLORLESS (A) 09/06/2019 0917   APPEARANCEUR CLEAR (A) 09/06/2019 0917   LABSPEC 1.030 09/06/2019 0917   PHURINE 7.0 09/06/2019 0917   GLUCOSEU NEGATIVE 09/06/2019 0917   HGBUR NEGATIVE 09/06/2019 0917   BILIRUBINUR NEGATIVE 09/06/2019 0917   KETONESUR NEGATIVE 09/06/2019 0917   PROTEINUR NEGATIVE 09/06/2019 0917   NITRITE NEGATIVE 09/06/2019 0917   LEUKOCYTESUR NEGATIVE 09/06/2019 0917    Microbiology Recent Results (from the past 240 hour(s))  Resp Panel by RT-PCR (Flu A&B, Covid) Nasopharyngeal Swab     Status: None   Collection Time: 05/19/21  2:17 AM   Specimen:  Nasopharyngeal Swab; Nasopharyngeal(NP) swabs in vial transport medium  Result Value Ref Range Status   SARS Coronavirus 2 by RT PCR NEGATIVE NEGATIVE Final    Comment: (NOTE) SARS-CoV-2 target nucleic acids are NOT DETECTED.  The SARS-CoV-2 RNA is generally detectable in upper respiratory specimens during the acute phase of infection. The lowest concentration of SARS-CoV-2 viral copies this assay can detect is 138 copies/mL. A negative result does not preclude SARS-Cov-2 infection and should not be used as the sole basis for treatment or other patient management decisions. A negative result may occur with  improper specimen collection/handling, submission of specimen other than nasopharyngeal swab, presence of viral mutation(s) within the areas targeted by this assay, and inadequate number of viral copies(<138 copies/mL). A negative result must be combined with clinical observations, patient history, and epidemiological information. The expected result is Negative.  Fact Sheet for Patients:  BloggerCourse.com  Fact Sheet for Healthcare Providers:  SeriousBroker.it  This test is no t yet approved or cleared by the [place] FDA and  has been authorized for detection and/or diagnosis of SARS-CoV-2 by FDA under an Emergency Use Authorization (EUA). This EUA will remain  in effect (meaning this test can be used) for the duration of the COVID-19 declaration under Section 564(b)(1) of the Act, 21 U.S.C.section 360bbb-3(b)(1), unless the authorization is terminated  or revoked sooner.       Influenza A by PCR NEGATIVE NEGATIVE Final   Influenza B by PCR NEGATIVE NEGATIVE Final    Comment: (NOTE) The Xpert Xpress SARS-CoV-2/FLU/RSV plus assay is intended as an aid in the diagnosis of influenza from Nasopharyngeal swab specimens and should not be used as a sole basis for treatment. Nasal washings and aspirates are unacceptable for  Xpert Xpress SARS-CoV-2/FLU/RSV testing.  Fact Sheet for Patients: BloggerCourse.com  Fact Sheet for Healthcare Providers: SeriousBroker.it  This test is not yet approved or cleared by the [place] FDA and has been authorized for detection and/or diagnosis of SARS-CoV-2 by FDA under an Emergency Use Authorization (EUA). This EUA will remain in effect (meaning this test can be used) for the duration of the COVID-19 declaration under Section 564(b)(1) of the Act, 21 U.S.C. section 360bbb-3(b)(1), unless the authorization is terminated or revoked.  Performed at [facility], [address] [place], [place] [address]      Time coordinating discharge: > 30 minutes  SIGNED: Baldwin Jamaica, MD  [facility] 05/20/2021, 6:05 PM  If 7PM-7AM, please contact night-coverage [email or web address]

## 2021-05-20 NOTE — Care Management Obs Status (Signed)
MEDICARE OBSERVATION STATUS NOTIFICATION   Patient Details  Name: Karmela Bram MRN: [id number] Date of Birth: 18-Jun-1961   Medicare Observation Status Notification Given:  Yes    [place] Kitner E Tramell Piechota, LCSW 05/20/2021, 10:15 AM

## 2021-05-20 NOTE — Op Note (Signed)
[facility] Gastroenterology Patient Name: Kerri Steele Procedure Date: 05/20/2021 11:07 AM MRN: [id number] Account #: [id number] Date of Birth: Jan 24, 1961 Admit Type: Inpatient Age: 60 Room: [facility] ENDO ROOM 4 Gender: Female Note Status: Finalized Procedure:             Colonoscopy Indications:           Iron deficiency anemia Providers:             [name] MD, MD Referring MD:          Erasmo Downer (Referring MD) Medicines:             Propofol per Anesthesia Complications:         No immediate complications. Procedure:             Pre-Anesthesia Assessment:                        - Prior to the procedure, a History and Physical was                         performed, and patient medications and allergies were                         reviewed. The patient's tolerance of previous                         anesthesia was also reviewed. The risks and benefits                         of the procedure and the sedation options and risks                         were discussed with the patient. All questions were                         answered, and informed consent was obtained. Prior                         Anticoagulants: The patient has taken no previous                         anticoagulant or antiplatelet agents. ASA Grade                         Assessment: III - A patient with severe systemic                         disease. After reviewing the risks and benefits, the                         patient was deemed in satisfactory condition to                         undergo the procedure.                        After obtaining informed consent, the colonoscope was  passed under direct vision. Throughout the procedure,                         the patient's blood pressure, pulse, and oxygen                         saturations were monitored continuously. The                         Colonoscope was introduced through the anus and                          advanced to the the cecum, identified by appendiceal                         orifice and ileocecal valve. The colonoscopy was                         performed without difficulty. The patient tolerated                         the procedure well. The quality of the bowel                         preparation was excellent. Findings:      The perianal and digital rectal examinations were normal.      The colon (entire examined portion) appeared normal. Impression:            - The entire examined colon is normal.                        - No specimens collected. Recommendation:        - Return patient to hospital ward for ongoing care.                        - Resume previous diet.                        - Continue present medications.                        - To visualize the small bowel, perform video capsule                         endoscopy as an outpatient. Procedure Code(s):     --- Professional ---                        [phone], Colonoscopy, flexible; diagnostic, including                         collection of specimen(s) by brushing or washing, when                         performed (separate procedure) Diagnosis Code(s):     --- Professional ---                        D50.9, Iron deficiency anemia, unspecified CPT copyright 2019 American Medical Association. All rights reserved. The codes documented in  this report are preliminary and upon coder review may  be revised to meet current compliance requirements. [name] MD, MD 05/20/2021 11:40:54 AM This report has been signed electronically. Number of Addenda: 0 Note Initiated On: 05/20/2021 11:07 AM Scope Withdrawal Time: 0 hours 5 minutes 12 seconds  Total Procedure Duration: 0 hours 7 minutes 33 seconds  Estimated Blood Loss:  Estimated blood loss: none.      [facility]

## 2021-05-20 NOTE — Progress Notes (Signed)
The patient had an EGD and colonoscopy without any sign of any active bleeding or cause of the patient's anemia.  There is no ulcers or polyps seen.  The patient got 1 unit of blood and more than appropriately corrected her hemoglobin with her hemoglobin this morning showing:  Component     Latest Ref Rng & Units 05/19/2021 05/19/2021 05/20/2021         4:47 AM  9:40 AM   Hemoglobin     12.0 - 15.0 g/dL 7.1 (L) 7.0 (L) 9.6 (L)  HCT     36.0 - 46.0 % 27.1 (L) 27.3 (L) 34.1 (L)   The patient should have a capsule endoscopy for complete work-up of her anemia.  This can be done as an outpatient.  I will sign off.  Please call if any further GI concerns or questions.  We would like to thank you for the opportunity to participate in the care of Kerri Steele.

## 2021-05-21 ENCOUNTER — Encounter: Payer: Self-pay | Admitting: Gastroenterology

## 2021-05-21 LAB — BPAM RBC
Blood Product Expiration Date: 202208092359
Blood Product Expiration Date: 202208112359
Blood Product Expiration Date: 202208172359
ISSUE DATE / TIME: 202207091416
Unit Type and Rh: 5100
Unit Type and Rh: 5100
Unit Type and Rh: 5100

## 2021-05-21 LAB — TYPE AND SCREEN
ABO/RH(D): O POS
Antibody Screen: POSITIVE
Donor AG Type: NEGATIVE
Unit division: 0
Unit division: 0
Unit division: 0

## 2021-05-22 ENCOUNTER — Encounter: Payer: Self-pay | Admitting: Oncology

## 2021-05-30 ENCOUNTER — Inpatient Hospital Stay: Payer: Medicare Other

## 2021-05-30 ENCOUNTER — Other Ambulatory Visit: Payer: Self-pay

## 2021-05-30 ENCOUNTER — Encounter: Payer: Self-pay | Admitting: Oncology

## 2021-05-30 ENCOUNTER — Inpatient Hospital Stay: Payer: Medicare Other | Attending: Oncology | Admitting: Oncology

## 2021-05-30 VITALS — BP 120/72 | HR 73 | Temp 97.3°F | Resp 18 | Wt 158.6 lb

## 2021-05-30 DIAGNOSIS — D509 Iron deficiency anemia, unspecified: Secondary | ICD-10-CM | POA: Diagnosis present

## 2021-05-30 DIAGNOSIS — I4891 Unspecified atrial fibrillation: Secondary | ICD-10-CM | POA: Diagnosis not present

## 2021-05-30 DIAGNOSIS — Z8249 Family history of ischemic heart disease and other diseases of the circulatory system: Secondary | ICD-10-CM | POA: Diagnosis not present

## 2021-05-30 DIAGNOSIS — Z7901 Long term (current) use of anticoagulants: Secondary | ICD-10-CM | POA: Diagnosis not present

## 2021-05-30 DIAGNOSIS — R5383 Other fatigue: Secondary | ICD-10-CM | POA: Insufficient documentation

## 2021-05-30 DIAGNOSIS — Z882 Allergy status to sulfonamides status: Secondary | ICD-10-CM | POA: Insufficient documentation

## 2021-05-30 DIAGNOSIS — Z7952 Long term (current) use of systemic steroids: Secondary | ICD-10-CM | POA: Insufficient documentation

## 2021-05-30 DIAGNOSIS — R42 Dizziness and giddiness: Secondary | ICD-10-CM | POA: Diagnosis not present

## 2021-05-30 DIAGNOSIS — Z885 Allergy status to narcotic agent status: Secondary | ICD-10-CM | POA: Diagnosis not present

## 2021-05-30 DIAGNOSIS — Z833 Family history of diabetes mellitus: Secondary | ICD-10-CM | POA: Insufficient documentation

## 2021-05-30 DIAGNOSIS — I10 Essential (primary) hypertension: Secondary | ICD-10-CM | POA: Insufficient documentation

## 2021-05-30 DIAGNOSIS — Z832 Family history of diseases of the blood and blood-forming organs and certain disorders involving the immune mechanism: Secondary | ICD-10-CM | POA: Diagnosis not present

## 2021-05-30 DIAGNOSIS — J449 Chronic obstructive pulmonary disease, unspecified: Secondary | ICD-10-CM | POA: Diagnosis not present

## 2021-05-30 DIAGNOSIS — Z87891 Personal history of nicotine dependence: Secondary | ICD-10-CM | POA: Diagnosis not present

## 2021-05-30 DIAGNOSIS — Z79899 Other long term (current) drug therapy: Secondary | ICD-10-CM | POA: Diagnosis not present

## 2021-05-30 NOTE — Progress Notes (Signed)
Hematology/Oncology Consult note [facility] [phone]) [phone] [phone]   Patient Care Team: [name], Lindsey F, NP as PCP - General (Nurse Practitioner)  REFERRING PROVIDER: Baldwin JamaicaMasoud, Hannah, MD CHIEF COMPLAINTS/REASON FOR VISIT:  Evaluation of iron deficiency anemia  HISTORY OF PRESENTING ILLNESS:  Kerri Steele is a  60 y.o.  female with PMH listed below was seen in consultation at the request of Baldwin JamaicaMasoud, Hannah, MD   for evaluation of iron deficiency anemia.   05/07/2021, patient received 1 unit of PRBC transfusion due to hemoglobin less than 7. 05/19/2021 - 05/20/2021 patient presented emergency room for evaluation of dizziness and lightheadedness.  Hemoglobin was 7.1.  Patient received another unit of PRBC. Underwent EGD and colonoscopy without significant findings to explain her iron deficiency anemia.  No active bleeding spot or ulcer discovered. Small bowel capsule study was arranged with gastroenterology in September 2022.  Patient has a history of atrial fibrillation and has been on Eliquis 2.5 mg twice daily.  Patient also had acute on chronic COPD exacerbation.  Was treated with a course of prednisone. Today she reports ongoing feeling of dizziness.  Lightheaded, fatigue.  Denies any black or bloody stool.  Review of Systems  Constitutional:  Positive for fatigue. Negative for appetite change, chills and fever.  HENT:   Negative for hearing loss and voice change.   Eyes:  Negative for eye problems.  Respiratory:  Negative for chest tightness and cough.   Cardiovascular:  Negative for chest pain.  Gastrointestinal:  Negative for abdominal distention, abdominal pain and blood in stool.  Endocrine: Negative for hot flashes.  Genitourinary:  Negative for difficulty urinating and frequency.   Musculoskeletal:  Negative for arthralgias.  Skin:  Negative for itching and rash.  Neurological:  Positive for dizziness and light-headedness. Negative  for extremity weakness.  Hematological:  Negative for adenopathy.  Psychiatric/Behavioral:  Negative for confusion.   MEDICAL HISTORY:  Past Medical History:  Diagnosis Date   Anginal pain (HCC)    Anxiety    Asthma    COPD (chronic obstructive pulmonary disease) (HCC)    Depression    Diabetes mellitus without complication (HCC)    GERD (gastroesophageal reflux disease)    Has artificial bladder    Hypertension    Hypothyroidism    Pneumonia    Shortness of breath dyspnea    Sleep apnea     SURGICAL HISTORY: Past Surgical History:  Procedure Laterality Date   ANKLE SURGERY     BLADDER SURGERY     CARDIAC CATHETERIZATION Left 08/23/2015   Procedure: Left Heart Cath and Coronary Angiography;  Surgeon: Dalia HeadingKenneth A Fath, MD;  Location: [facility] INVASIVE CV LAB;  Service: Cardiovascular;  Laterality: Left;   COLONOSCOPY WITH PROPOFOL N/A 05/20/2021   Procedure: COLONOSCOPY WITH PROPOFOL;  Surgeon: Midge MiniumWohl, Darren, MD;  [facility];  Service: Endoscopy;  Laterality: N/A;   ESOPHAGEAL DILATION     ESOPHAGOGASTRODUODENOSCOPY (EGD) WITH PROPOFOL N/A 05/20/2021   Procedure: ESOPHAGOGASTRODUODENOSCOPY (EGD) WITH PROPOFOL;  Surgeon: Midge MiniumWohl, Darren, MD;  Location: [facility] ENDOSCOPY;  Service: Endoscopy;  Laterality: N/A;   LOOP RECORDER INSERTION N/A 06/01/2020   Procedure: LOOP RECORDER INSERTION;  Surgeon: Marcina MillardParaschos, Alexander, MD;  Location: [facility] INVASIVE CV LAB;  Service: Cardiovascular;  Laterality: N/A;   TEE WITHOUT CARDIOVERSION N/A 06/01/2020   Procedure: TRANSESOPHAGEAL ECHOCARDIOGRAM (TEE);  Surgeon: Lamar BlinksKowalski, Bruce J, MD;  Location: [facility] ORS;  Service: Cardiovascular;  Laterality: N/A;   TUBAL LIGATION     WRIST SURGERY  SOCIAL HISTORY: Social History   Socioeconomic History   Marital status: Married    Spouse name: Not on file   Number of children: Not on file   Years of education: Not on file   Highest education level: Not on file  Occupational History   Not on  file  Tobacco Use   Smoking status: Former    Packs/day: 2.00    Years: 35.00    Pack years: 70.00    Types: Cigarettes    Quit date: 01/25/2010    Years since quitting: 11.3   Smokeless tobacco: Never  Vaping Use   Vaping Use: Never used  Substance and Sexual Activity   Alcohol use: Not Currently    Comment: quit 6 years ago   Drug use: Yes    Comment: prescribed tramadol, oxycodone,and robaxin   Sexual activity: Not on file  Other Topics Concern   Not on file  Social History Narrative   Not on file   Social Determinants of Health   Financial Resource Strain: Not on file  Food Insecurity: Not on file  Transportation Needs: Not on file  Physical Activity: Not on file  Stress: Not on file  Social Connections: Not on file  Intimate Partner Violence: Not on file    FAMILY HISTORY: Family History  Problem Relation Age of Onset   Hypertension Mother    Diabetes Mother    Lupus Mother    Atrial fibrillation Mother    Heart disease Father    Acute myelogenous leukemia Nephew     ALLERGIES:  is allergic to metformin and related, morphine and related, other, and sulfa antibiotics.  MEDICATIONS:  Current Outpatient Medications  Medication Sig Dispense Refill   arformoterol (BROVANA) 15 MCG/2ML NEBU Take 15 mcg by nebulization 2 (two) times daily.     atorvastatin (LIPITOR) 40 MG tablet Take 1 tablet (40 mg total) by mouth daily. 90 tablet 0   DULoxetine (CYMBALTA) 60 MG capsule Take 60 mg by mouth daily.     ELIQUIS 5 MG TABS tablet Take 5 mg by mouth 2 (two) times daily.     glipiZIDE (GLUCOTROL XL) 10 MG 24 hr tablet Take 10 mg by mouth daily with breakfast.     HYDROcodone-acetaminophen (NORCO) 5-325 MG tablet Take 1 tablet by mouth every 6 (six) hours as needed for severe pain. 7 tablet 0   Ipratropium-Albuterol (COMBIVENT RESPIMAT) 20-100 MCG/ACT AERS respimat Inhale 1 puff into the lungs 4 (four) times daily.     magnesium oxide (MAG-OX) 400 MG tablet Take 400 mg  by mouth 2 (two) times daily.      meclizine (ANTIVERT) 25 MG tablet Take by mouth. Take 25 mg by mouth once daily as needed     methocarbamol (ROBAXIN) 750 MG tablet Take 750 mg by mouth 3 (three) times daily.      pantoprazole (PROTONIX) 40 MG tablet Take 1 tablet (40 mg total) by mouth 2 (two) times daily. 60 tablet 11   predniSONE (DELTASONE) 5 MG tablet Take 5 mg by mouth daily.     pregabalin (LYRICA) 150 MG capsule Take 150 mg by mouth 3 (three) times daily.     sucralfate (CARAFATE) 1 g tablet Take 1 tablet (1 g total) by mouth 4 (four) times daily. 60 tablet 1   sulfamethoxazole-trimethoprim (BACTRIM) 400-80 MG tablet Take 1 tablet by mouth 3 (three) times a week.     tiotropium (SPIRIVA) 18 MCG inhalation capsule Place 18 mcg into inhaler and inhale  daily.     traMADol (ULTRAM) 50 MG tablet Take 50 mg by mouth every 6 (six) hours as needed for moderate pain.     verapamil (CALAN-SR) 120 MG CR tablet Take 120 mg by mouth daily.     No current facility-administered medications for this visit.     PHYSICAL EXAMINATION: ECOG PERFORMANCE STATUS: 0 - Asymptomatic Vitals:   05/30/21 1459  BP: 120/72  Pulse: 73  Resp: 18  Temp: (!) 97.3 F (36.3 C)   Filed Weights   05/30/21 1459  Weight: 158 lb 9.6 oz (71.9 kg)    Physical Exam Constitutional:      General: She is not in acute distress. HENT:     Head: Normocephalic and atraumatic.  Eyes:     General: No scleral icterus. Cardiovascular:     Rate and Rhythm: Normal rate and regular rhythm.     Heart sounds: Normal heart sounds.  Pulmonary:     Effort: Pulmonary effort is normal. No respiratory distress.     Breath sounds: No wheezing.  Abdominal:     General: Bowel sounds are normal. There is no distension.     Palpations: Abdomen is soft.  Musculoskeletal:        General: No deformity. Normal range of motion.     Cervical back: Normal range of motion and neck supple.  Skin:    General: Skin is warm and dry.      Findings: No erythema or rash.  Neurological:     Mental Status: She is alert and oriented to person, place, and time. Mental status is at baseline.     Cranial Nerves: No cranial nerve deficit.     Coordination: Coordination normal.  Psychiatric:        Mood and Affect: Mood normal.      CMP Latest Ref Rng & Units 05/20/2021  Glucose 70 - 99 mg/dL 794(I)  BUN 6 - 20 mg/dL 16  Creatinine 0.16 - 5.53 mg/dL 7.48  Sodium 270 - 786 mmol/L 142  Potassium 3.5 - 5.1 mmol/L 3.5  Chloride 98 - 111 mmol/L 109  CO2 22 - 32 mmol/L 27  Calcium 8.9 - 10.3 mg/dL 9.3  Total Protein 6.5 - 8.1 g/dL -  Total Bilirubin 0.3 - 1.2 mg/dL -  Alkaline Phos 38 - 754 U/L -  AST 15 - 41 U/L -  ALT 0 - 44 U/L -   CBC Latest Ref Rng & Units 05/20/2021  WBC 4.0 - 10.5 K/uL -  Hemoglobin 12.0 - 15.0 g/dL 9.0(L)  Hematocrit 36.0 - 46.0 % 31.7(L)  Platelets 150 - 400 K/uL -     LABORATORY DATA:  I have reviewed the data as listed Lab Results  Component Value Date   WBC 12.4 (H) 05/20/2021   HGB 9.0 (L) 05/20/2021   HCT 31.7 (L) 05/20/2021   MCV 70.7 (L) 05/20/2021   PLT 401 (H) 05/20/2021   Recent Labs    05/07/21 1607 05/18/21 1938 05/20/21 0436  NA 139 139 142  K 4.1 4.4 3.5  CL 101 104 109  CO2 27 29 27   GLUCOSE 266* 130* 108*  BUN 19 17 16   CREATININE 1.38* 0.92 0.76  CALCIUM 9.9 9.2 9.3  GFRNONAA 44* >60 >60  PROT 7.1  --   --   ALBUMIN 3.8  --   --   AST 18  --   --   ALT 17  --   --   ALKPHOS 77  --   --  BILITOT 0.4  --   --    Iron/TIBC/Ferritin/ %Sat    Component Value Date/Time   IRON 26 (L) 05/19/2021 0217   IRON 63 08/26/2012 1011   TIBC 479 (H) 05/19/2021 0217   TIBC 330 08/26/2012 1011   FERRITIN 35 05/19/2021 0217   FERRITIN 3 (L) 06/16/2012 0825   IRONPCTSAT 5 (L) 05/19/2021 0217   IRONPCTSAT 19 08/26/2012 1011     RADIOGRAPHIC STUDIES: I have personally reviewed the radiological images as listed and agreed with the findings in the report. DG Chest  Portable 1 View  Result Date: 05/18/2021 CLINICAL DATA:  Chest pain and shortness of breath EXAM: PORTABLE CHEST 1 VIEW COMPARISON:  05/29/2020 FINDINGS: The heart size and mediastinal contours are within normal limits. Both lungs are clear. The visualized skeletal structures are unremarkable. IMPRESSION: No active disease. Electronically Signed   By: Alcide Clever M.D.   On: 05/18/2021 19:50       ASSESSMENT & PLAN:  1. Iron deficiency anemia, unspecified iron deficiency anemia type    Labs are reviewed and discussed with patient. Consistent with iron deficiency anemia. Plan IV iron with Venofer 200mg  weekly x 4 doses. Allergy reactions/infusion reaction including anaphylactic reaction discussed with patient. Other side effects include but not limited to high blood pressure, skin rash, weight gain, leg swelling, etc. Patient voices understanding and willing to proceed.  Orders Placed This Encounter  Procedures   CBC with Differential/Platelet    Standing Status:   Future    Standing Expiration Date:   05/30/2022   Ferritin    Standing Status:   Future    Standing Expiration Date:   05/30/2022   Iron and TIBC    Standing Status:   Future    Standing Expiration Date:   05/30/2022    All questions were answered. The patient knows to call the clinic with any problems questions or concerns.  Cc 06/01/2022, MD  Return of visit: 10 weeks lab cbc iron tibc ferritin Thank you for this kind referral and the opportunity to participate in the care of this patient. A copy of today's note is routed to referring provider   Baldwin Jamaica, MD, PhD Hematology Oncology [facility] 05/30/2021

## 2021-05-30 NOTE — Progress Notes (Signed)
Patient here to establish care  

## 2021-06-06 ENCOUNTER — Other Ambulatory Visit: Payer: Self-pay

## 2021-06-06 ENCOUNTER — Inpatient Hospital Stay: Payer: Medicare Other

## 2021-06-06 VITALS — BP 137/76 | HR 68 | Temp 97.2°F | Resp 18

## 2021-06-06 DIAGNOSIS — D500 Iron deficiency anemia secondary to blood loss (chronic): Secondary | ICD-10-CM

## 2021-06-06 DIAGNOSIS — D509 Iron deficiency anemia, unspecified: Secondary | ICD-10-CM | POA: Diagnosis not present

## 2021-06-06 MED ORDER — SODIUM CHLORIDE 0.9 % IV SOLN
Freq: Once | INTRAVENOUS | Status: DC
  Filled 2021-06-06: qty 250

## 2021-06-06 MED ORDER — SODIUM CHLORIDE 0.9 % IV SOLN: 200.0000 mg | Freq: Once | INTRAVENOUS | Status: DC

## 2021-06-06 MED ORDER — HEPARIN SOD (PORK) LOCK FLUSH 100 UNIT/ML IV SOLN
500.0000 [IU] | Freq: Once | INTRAVENOUS | Status: DC | PRN
  Filled 2021-06-06: qty 5

## 2021-06-06 MED ORDER — HEPARIN SOD (PORK) LOCK FLUSH 100 UNIT/ML IV SOLN
250.0000 [IU] | Freq: Once | INTRAVENOUS | Status: DC | PRN
  Filled 2021-06-06: qty 5

## 2021-06-06 MED ORDER — SODIUM CHLORIDE 0.9% FLUSH
3.0000 mL | Freq: Once | INTRAVENOUS | Status: DC | PRN
  Filled 2021-06-06: qty 3

## 2021-06-06 MED ORDER — SODIUM CHLORIDE 0.9% FLUSH
10.0000 mL | Freq: Once | INTRAVENOUS | Status: DC | PRN
  Filled 2021-06-06: qty 10

## 2021-06-06 MED ORDER — IRON SUCROSE 20 MG/ML IV SOLN
200.0000 mg | Freq: Once | INTRAVENOUS | Status: AC
  Administered 2021-06-06: 200 mg via INTRAVENOUS
  Filled 2021-06-06: qty 10

## 2021-06-06 NOTE — Patient Instructions (Signed)
CANCER CENTER [place] [facility]  Discharge Instructions: Thank you for choosing [facility] to provide your oncology and hematology care.  If you have a lab appointment with the Cancer Center, please go directly to the Cancer Center and check in at the registration area.  Wear comfortable clothing and clothing appropriate for easy access to any Portacath or PICC line.   We strive to give you quality time with your provider. You may need to reschedule your appointment if you arrive late (15 or more minutes).  Arriving late affects you and other patients whose appointments are after yours.  Also, if you miss three or more appointments without notifying the office, you may be dismissed from the clinic at the provider's discretion.      For prescription refill requests, have your pharmacy contact our office and allow 72 hours for refills to be completed.    Today you received the following chemotherapy and/or immunotherapy agents venofer       To help prevent nausea and vomiting after your treatment, we encourage you to take your nausea medication as directed.  BELOW ARE SYMPTOMS THAT SHOULD BE REPORTED IMMEDIATELY: *FEVER GREATER THAN 100.4 F (38 C) OR HIGHER *CHILLS OR SWEATING *NAUSEA AND VOMITING THAT IS NOT CONTROLLED WITH YOUR NAUSEA MEDICATION *UNUSUAL SHORTNESS OF BREATH *UNUSUAL BRUISING OR BLEEDING *URINARY PROBLEMS (pain or burning when urinating, or frequent urination) *BOWEL PROBLEMS (unusual diarrhea, constipation, pain near the anus) TENDERNESS IN MOUTH AND THROAT WITH OR WITHOUT PRESENCE OF ULCERS (sore throat, sores in mouth, or a toothache) UNUSUAL RASH, SWELLING OR PAIN  UNUSUAL VAGINAL DISCHARGE OR ITCHING   Items with * indicate a potential emergency and should be followed up as soon as possible or go to the Emergency Department if any problems should occur.  Please show the CHEMOTHERAPY ALERT CARD or IMMUNOTHERAPY ALERT CARD at check-in  to the Emergency Department and triage nurse.  Should you have questions after your visit or need to cancel or reschedule your appointment, please contact CANCER CENTER  [facility]  [phone] and follow the prompts.  Office hours are 8:00 a.m. to 4:30 p.m. Monday - Friday. Please note that voicemails left after 4:00 p.m. may not be returned until the following business day.  We are closed weekends and major holidays. You have access to a nurse at all times for urgent questions. Please call the main number to the clinic [phone] and follow the prompts.  For any non-urgent questions, you may also contact your provider using MyChart. We now offer e-Visits for anyone 18 and older to request care online for non-urgent symptoms. For details visit [email or web address].   Also download the MyChart app! Go to the app store, search "MyChart", open the app, select [place], and log in with your MyChart username and password.  Due to Covid, a mask is required upon entering the hospital/clinic. If you do not have a mask, one will be given to you upon arrival. For doctor visits, patients may have 1 support person aged 18 or older with them. For treatment visits, patients cannot have anyone with them due to current Covid guidelines and our immunocompromised population.   Iron Sucrose injection What is this medication? IRON SUCROSE (AHY ern SOO krohs) is an iron complex. Iron is used to make healthy red blood cells, which carry oxygen and nutrients throughout the body. This medicine is used to treat iron deficiency anemia in people with chronickidney disease. This medicine may be used for   other purposes; ask your health care provider orpharmacist if you have questions. COMMON BRAND NAME(S): Venofer What should I tell my care team before I take this medication? They need to know if you have any of these conditions: anemia not caused by low iron levels heart disease high levels of  iron in the blood kidney disease liver disease an unusual or allergic reaction to iron, other medicines, foods, dyes, or preservatives pregnant or trying to get pregnant breast-feeding How should I use this medication? This medicine is for infusion into a vein. It is given by a health careprofessional in a hospital or clinic setting. Talk to your pediatrician regarding the use of this medicine in children. While this drug may be prescribed for children as young as 2 years for selectedconditions, precautions do apply. Overdosage: If you think you have taken too much of this medicine contact apoison control center or emergency room at once. NOTE: This medicine is only for you. Do not share this medicine with others. What if I miss a dose? It is important not to miss your dose. Call your doctor or health careprofessional if you are unable to keep an appointment. What may interact with this medication? Do not take this medicine with any of the following medications: deferoxamine dimercaprol other iron products This medicine may also interact with the following medications: chloramphenicol deferasirox This list may not describe all possible interactions. Give your health care provider a list of all the medicines, herbs, non-prescription drugs, or dietary supplements you use. Also tell them if you smoke, drink alcohol, or use illegaldrugs. Some items may interact with your medicine. What should I watch for while using this medication? Visit your doctor or healthcare professional regularly. Tell your doctor or healthcare professional if your symptoms do not start to get better or if theyget worse. You may need blood work done while you are taking this medicine. You may need to follow a special diet. Talk to your doctor. Foods that contain iron include: whole grains/cereals, dried fruits, beans, or peas, leafy greenvegetables, and organ meats (liver, kidney). What side effects may I notice from  receiving this medication? Side effects that you should report to your doctor or health care professionalas soon as possible: allergic reactions like skin rash, itching or hives, swelling of the face, lips, or tongue breathing problems changes in blood pressure cough fast, irregular heartbeat feeling faint or lightheaded, falls fever or chills flushing, sweating, or hot feelings joint or muscle aches/pains seizures swelling of the ankles or feet unusually weak or tired Side effects that usually do not require medical attention (report to yourdoctor or health care professional if they continue or are bothersome): diarrhea feeling achy headache irritation at site where injected nausea, vomiting stomach upset tiredness This list may not describe all possible side effects. Call your doctor for medical advice about side effects. You may report side effects to FDA at1-800-FDA-1088. Where should I keep my medication? This drug is given in a hospital or clinic and will not be stored at home. NOTE: This sheet is a summary. It may not cover all possible information. If you have questions about this medicine, talk to your doctor, pharmacist, orhealth care provider.  2022 Elsevier/Gold Standard (2011-08-08 17:14:35)  

## 2021-06-13 ENCOUNTER — Other Ambulatory Visit: Payer: Self-pay

## 2021-06-13 ENCOUNTER — Inpatient Hospital Stay: Payer: Medicare Other | Attending: Oncology

## 2021-06-13 VITALS — BP 141/67 | Resp 18

## 2021-06-13 DIAGNOSIS — Z833 Family history of diabetes mellitus: Secondary | ICD-10-CM | POA: Diagnosis not present

## 2021-06-13 DIAGNOSIS — Z832 Family history of diseases of the blood and blood-forming organs and certain disorders involving the immune mechanism: Secondary | ICD-10-CM | POA: Insufficient documentation

## 2021-06-13 DIAGNOSIS — Z79899 Other long term (current) drug therapy: Secondary | ICD-10-CM | POA: Diagnosis not present

## 2021-06-13 DIAGNOSIS — Z87891 Personal history of nicotine dependence: Secondary | ICD-10-CM | POA: Diagnosis not present

## 2021-06-13 DIAGNOSIS — R42 Dizziness and giddiness: Secondary | ICD-10-CM | POA: Diagnosis not present

## 2021-06-13 DIAGNOSIS — R5383 Other fatigue: Secondary | ICD-10-CM | POA: Insufficient documentation

## 2021-06-13 DIAGNOSIS — D509 Iron deficiency anemia, unspecified: Secondary | ICD-10-CM | POA: Insufficient documentation

## 2021-06-13 DIAGNOSIS — Z8249 Family history of ischemic heart disease and other diseases of the circulatory system: Secondary | ICD-10-CM | POA: Diagnosis not present

## 2021-06-13 DIAGNOSIS — D500 Iron deficiency anemia secondary to blood loss (chronic): Secondary | ICD-10-CM

## 2021-06-13 MED ORDER — SODIUM CHLORIDE 0.9 % IV SOLN
Freq: Once | INTRAVENOUS | Status: AC
  Filled 2021-06-13: qty 250

## 2021-06-13 MED ORDER — IRON SUCROSE 20 MG/ML IV SOLN
200.0000 mg | Freq: Once | INTRAVENOUS | Status: AC
  Administered 2021-06-13: 200 mg via INTRAVENOUS
  Filled 2021-06-13: qty 10

## 2021-06-13 MED ORDER — SODIUM CHLORIDE 0.9 % IV SOLN: 200.0000 mg | Freq: Once | INTRAVENOUS | Status: DC

## 2021-06-13 NOTE — Patient Instructions (Signed)
C[facility]ER CENTER [place] [facility]  Discharge Instructions: Thank you for choosing [facility] to provide your oncology and hematology care.  If you have a lab appointment with the Cancer Center, please go directly to the Cancer Center and check in at the registration area.  Wear comfortable clothing and clothing appropriate for easy access to any Portacath or PICC line.   We strive to give you quality time with your provider. You may need to reschedule your appointment if you arrive late (15 or more minutes).  Arriving late affects you and other patients whose appointments are after yours.  Also, if you miss three or more appointments without notifying the office, you may be dismissed from the clinic at the provider's discretion.      For prescription refill requests, have your pharmacy contact our office and allow 72 hours for refills to be completed.    Today you received the following : Venofer   To help prevent nausea and vomiting after your treatment, we encourage you to take your nausea medication as directed.  BELOW ARE SYMPTOMS THAT SHOULD BE REPORTED IMMEDIATELY: . *FEVER GREATER THAN 100.4 F (38 C) OR HIGHER . *CHILLS OR SWEATING . *NAUSEA AND VOMITING THAT IS NOT CONTROLLED WITH YOUR NAUSEA MEDICATION . *UNUSUAL SHORTNESS OF BREATH . *UNUSUAL BRUISING OR BLEEDING . *URINARY PROBLEMS (pain or burning when urinating, or frequent urination) . *BOWEL PROBLEMS (unusual diarrhea, constipation, pain near the anus) . TENDERNESS IN MOUTH AND THROAT WITH OR WITHOUT PRESENCE OF ULCERS (sore throat, sores in mouth, or a toothache) . UNUSUAL RASH, SWELLING OR PAIN  . UNUSUAL VAGINAL DISCHARGE OR ITCHING   Items with * indicate a potential emergency and should be followed up as soon as possible or go to the Emergency Department if any problems should occur.  Please show the CHEMOTHERAPY ALERT CARD or IMMUNOTHERAPY ALERT CARD at check-in to the Emergency  Department and triage nurse.  Should you have questions after your visit or need to cancel or reschedule your appointment, please contact [facility] [place] [facility]  [phone] and follow the prompts.  Office hours are 8:00 a.m. to 4:30 p.m. Monday - Friday. Please note that voicemails left after 4:00 p.m. may not be returned until the following business day.  We are closed weekends and major holidays. You have access to a nurse at all times for urgent questions. Please call the main number to the clinic [phone] and follow the prompts.  For any non-urgent questions, you may also contact your provider using MyChart. We now offer e-Visits for anyone 18 and older to request care online for non-urgent symptoms. For details visit [email or web address].   Also download the MyChart app! Go to the app store, search "MyChart", open the app, select [place], and log in with your MyChart username and password.  Due to Covid, a mask is required upon entering the hospital/clinic. If you do not have a mask, one will be given to you upon arrival. For doctor visits, patients may have 1 support person aged 18 or older with them. For treatment visits, patients cannot have anyone with them due to current Covid guidelines and our immunocompromised population.  

## 2021-06-20 ENCOUNTER — Other Ambulatory Visit: Payer: Self-pay

## 2021-06-20 ENCOUNTER — Inpatient Hospital Stay: Payer: Medicare Other

## 2021-06-20 VITALS — BP 130/53 | HR 66 | Temp 96.3°F | Resp 18

## 2021-06-20 DIAGNOSIS — D500 Iron deficiency anemia secondary to blood loss (chronic): Secondary | ICD-10-CM

## 2021-06-20 DIAGNOSIS — D509 Iron deficiency anemia, unspecified: Secondary | ICD-10-CM | POA: Diagnosis not present

## 2021-06-20 LAB — CBC WITH DIFFERENTIAL/PLATELET
Abs Immature Granulocytes: 0.06 10*3/uL (ref 0.00–0.07)
Basophils Absolute: 0 10*3/uL (ref 0.0–0.1)
Basophils Relative: 0 %
Eosinophils Absolute: 0.2 10*3/uL (ref 0.0–0.5)
Eosinophils Relative: 1 %
HCT: 36.3 % (ref 36.0–46.0)
Hemoglobin: 10.5 g/dL — ABNORMAL LOW (ref 12.0–15.0)
Immature Granulocytes: 1 %
Lymphocytes Relative: 12 %
Lymphs Abs: 1.5 10*3/uL (ref 0.7–4.0)
MCH: 22.3 pg — ABNORMAL LOW (ref 26.0–34.0)
MCHC: 28.9 g/dL — ABNORMAL LOW (ref 30.0–36.0)
MCV: 77.2 fL — ABNORMAL LOW (ref 80.0–100.0)
Monocytes Absolute: 0.7 10*3/uL (ref 0.1–1.0)
Monocytes Relative: 5 %
Neutro Abs: 10.8 10*3/uL — ABNORMAL HIGH (ref 1.7–7.7)
Neutrophils Relative %: 81 %
Platelets: 335 10*3/uL (ref 150–400)
RBC: 4.7 MIL/uL (ref 3.87–5.11)
RDW: 32.9 % — ABNORMAL HIGH (ref 11.5–15.5)
WBC: 13.3 10*3/uL — ABNORMAL HIGH (ref 4.0–10.5)
nRBC: 0 % (ref 0.0–0.2)

## 2021-06-20 MED ORDER — SODIUM CHLORIDE 0.9 % IV SOLN
Freq: Once | INTRAVENOUS | Status: AC
  Filled 2021-06-20: qty 250

## 2021-06-20 MED ORDER — IRON SUCROSE 20 MG/ML IV SOLN
200.0000 mg | Freq: Once | INTRAVENOUS | Status: AC
  Administered 2021-06-20: 200 mg via INTRAVENOUS
  Filled 2021-06-20: qty 10

## 2021-06-20 MED ORDER — SODIUM CHLORIDE 0.9 % IV SOLN: 200.0000 mg | Freq: Once | INTRAVENOUS | Status: DC

## 2021-06-20 NOTE — Patient Instructions (Signed)
[facility] [place] [facility]  Discharge Instructions: Thank you for choosing [facility] to provide your oncology and hematology care.  If you have a lab appointment with the Cancer Center, please go directly to the Cancer Center and check in at the registration area.  Wear comfortable clothing and clothing appropriate for easy access to any Portacath or PICC line.   We strive to give you quality time with your provider. You may need to reschedule your appointment if you arrive late (15 or more minutes).  Arriving late affects you and other patients whose appointments are after yours.  Also, if you miss three or more appointments without notifying the office, you may be dismissed from the clinic at the provider's discretion.      For prescription refill requests, have your pharmacy contact our office and allow 72 hours for refills to be completed.    Today you received venofer   To help prevent nausea and vomiting after your treatment, we encourage you to take your nausea medication as directed.  BELOW ARE SYMPTOMS THAT SHOULD BE REPORTED IMMEDIATELY: *FEVER GREATER THAN 100.4 F (38 C) OR HIGHER *CHILLS OR SWEATING *NAUSEA AND VOMITING THAT IS NOT CONTROLLED WITH YOUR NAUSEA MEDICATION *UNUSUAL SHORTNESS OF BREATH *UNUSUAL BRUISING OR BLEEDING *URINARY PROBLEMS (pain or burning when urinating, or frequent urination) *BOWEL PROBLEMS (unusual diarrhea, constipation, pain near the anus) TENDERNESS IN MOUTH AND THROAT WITH OR WITHOUT PRESENCE OF ULCERS (sore throat, sores in mouth, or a toothache) UNUSUAL RASH, SWELLING OR PAIN  UNUSUAL VAGINAL DISCHARGE OR ITCHING   Items with * indicate a potential emergency and should be followed up as soon as possible or go to the Emergency Department if any problems should occur.  Please show the CHEMOTHERAPY ALERT CARD or IMMUNOTHERAPY ALERT CARD at check-in to the Emergency Department and triage nurse.  Should you  have questions after your visit or need to cancel or reschedule your appointment, please contact [facility] [place] [facility]  [phone] and follow the prompts.  Office hours are 8:00 a.m. to 4:30 p.m. Monday - Friday. Please note that voicemails left after 4:00 p.m. may not be returned until the following business day.  We are closed weekends and major holidays. You have access to a nurse at all times for urgent questions. Please call the main number to the clinic [phone] and follow the prompts.  For any non-urgent questions, you may also contact your provider using MyChart. We now offer e-Visits for anyone 18 and older to request care online for non-urgent symptoms. For details visit [email or web address].   Also download the MyChart app! Go to the app store, search "MyChart", open the app, select [facility], and log in with your MyChart username and password.  Due to Covid, a mask is required upon entering the hospital/clinic. If you do not have a mask, one will be given to you upon arrival. For doctor visits, patients may have 1 support person aged 18 or older with them. For treatment visits, patients cannot have anyone with them due to current Covid guidelines and our immunocompromised population.  

## 2021-06-20 NOTE — Progress Notes (Signed)
Lab results reviewed with MD and treatment team. No new orders given at this time. Pt updated with lab results. Pt stated that the dizziness comes and goes and is not experiencing any dizziness at the moment. Pt expressed to RN that if the "dizziness stays or worsen she will reach out to her PCP/NP for further evaluation." MD and treatment team updated. No new orders given at this time. Vitals stable. Pt stable for discharge.   Bijan Ridgley [employer]

## 2021-06-20 NOTE — Progress Notes (Signed)
Patient reports dizziness since 06/16/21. Reports that she only ever experiences this when her "blood is low". Dr. Cathie Hoops made aware. Per Dr. Cathie Hoops, recheck cbc/d prior to Venofer treatment.

## 2021-06-27 ENCOUNTER — Inpatient Hospital Stay: Payer: Medicare Other

## 2021-06-27 VITALS — BP 134/70 | HR 63 | Temp 96.0°F | Resp 18

## 2021-06-27 DIAGNOSIS — D509 Iron deficiency anemia, unspecified: Secondary | ICD-10-CM | POA: Diagnosis not present

## 2021-06-27 DIAGNOSIS — D500 Iron deficiency anemia secondary to blood loss (chronic): Secondary | ICD-10-CM

## 2021-06-27 MED ORDER — IRON SUCROSE 20 MG/ML IV SOLN
200.0000 mg | Freq: Once | INTRAVENOUS | Status: AC
  Administered 2021-06-27: 200 mg via INTRAVENOUS
  Filled 2021-06-27: qty 10

## 2021-06-27 MED ORDER — SODIUM CHLORIDE 0.9 % IV SOLN
Freq: Once | INTRAVENOUS | Status: AC
  Filled 2021-06-27: qty 250

## 2021-06-27 MED ORDER — SODIUM CHLORIDE 0.9 % IV SOLN: 200.0000 mg | Freq: Once | INTRAVENOUS | Status: DC

## 2021-06-27 NOTE — Patient Instructions (Signed)

## 2021-07-19 ENCOUNTER — Telehealth: Payer: Self-pay | Admitting: Gastroenterology

## 2021-07-19 NOTE — Telephone Encounter (Signed)
Pt. Returning call.

## 2021-07-26 ENCOUNTER — Ambulatory Visit: Payer: Medicare Other | Admitting: Gastroenterology

## 2021-08-06 ENCOUNTER — Inpatient Hospital Stay: Payer: Medicare Other | Attending: Oncology

## 2021-08-06 ENCOUNTER — Other Ambulatory Visit: Payer: Self-pay

## 2021-08-06 DIAGNOSIS — R42 Dizziness and giddiness: Secondary | ICD-10-CM | POA: Insufficient documentation

## 2021-08-06 DIAGNOSIS — D509 Iron deficiency anemia, unspecified: Secondary | ICD-10-CM | POA: Diagnosis not present

## 2021-08-06 DIAGNOSIS — Z79899 Other long term (current) drug therapy: Secondary | ICD-10-CM | POA: Insufficient documentation

## 2021-08-06 DIAGNOSIS — Z8249 Family history of ischemic heart disease and other diseases of the circulatory system: Secondary | ICD-10-CM | POA: Insufficient documentation

## 2021-08-06 DIAGNOSIS — I4891 Unspecified atrial fibrillation: Secondary | ICD-10-CM | POA: Insufficient documentation

## 2021-08-06 DIAGNOSIS — Z833 Family history of diabetes mellitus: Secondary | ICD-10-CM | POA: Diagnosis not present

## 2021-08-06 DIAGNOSIS — Z882 Allergy status to sulfonamides status: Secondary | ICD-10-CM | POA: Diagnosis not present

## 2021-08-06 DIAGNOSIS — R5383 Other fatigue: Secondary | ICD-10-CM | POA: Insufficient documentation

## 2021-08-06 DIAGNOSIS — Z885 Allergy status to narcotic agent status: Secondary | ICD-10-CM | POA: Insufficient documentation

## 2021-08-06 DIAGNOSIS — Z87891 Personal history of nicotine dependence: Secondary | ICD-10-CM | POA: Insufficient documentation

## 2021-08-06 DIAGNOSIS — Z832 Family history of diseases of the blood and blood-forming organs and certain disorders involving the immune mechanism: Secondary | ICD-10-CM | POA: Diagnosis not present

## 2021-08-06 DIAGNOSIS — J441 Chronic obstructive pulmonary disease with (acute) exacerbation: Secondary | ICD-10-CM | POA: Diagnosis not present

## 2021-08-06 DIAGNOSIS — Z7901 Long term (current) use of anticoagulants: Secondary | ICD-10-CM | POA: Insufficient documentation

## 2021-08-06 LAB — CBC WITH DIFFERENTIAL/PLATELET
Abs Immature Granulocytes: 0.04 10*3/uL (ref 0.00–0.07)
Basophils Absolute: 0.1 10*3/uL (ref 0.0–0.1)
Basophils Relative: 1 %
Eosinophils Absolute: 0.4 10*3/uL (ref 0.0–0.5)
Eosinophils Relative: 5 %
HCT: 39.4 % (ref 36.0–46.0)
Hemoglobin: 12.1 g/dL (ref 12.0–15.0)
Immature Granulocytes: 0 %
Lymphocytes Relative: 25 %
Lymphs Abs: 2.4 10*3/uL (ref 0.7–4.0)
MCH: 25.5 pg — ABNORMAL LOW (ref 26.0–34.0)
MCHC: 30.7 g/dL (ref 30.0–36.0)
MCV: 82.9 fL (ref 80.0–100.0)
Monocytes Absolute: 0.7 10*3/uL (ref 0.1–1.0)
Monocytes Relative: 7 %
Neutro Abs: 5.7 10*3/uL (ref 1.7–7.7)
Neutrophils Relative %: 62 %
Platelets: 283 10*3/uL (ref 150–400)
RBC: 4.75 MIL/uL (ref 3.87–5.11)
RDW: 22.5 % — ABNORMAL HIGH (ref 11.5–15.5)
WBC: 9.3 10*3/uL (ref 4.0–10.5)
nRBC: 0 % (ref 0.0–0.2)

## 2021-08-06 LAB — RETIC PANEL
Immature Retic Fract: 30.8 % — ABNORMAL HIGH (ref 2.3–15.9)
RBC.: 4.68 MIL/uL (ref 3.87–5.11)
Retic Count, Absolute: 66.9 10*3/uL (ref 19.0–186.0)
Retic Ct Pct: 1.4 % (ref 0.4–3.1)
Reticulocyte Hemoglobin: 30.7 pg (ref >27.9)

## 2021-08-06 LAB — IRON AND TIBC
Iron: 43 ug/dL (ref 28–170)
Saturation Ratios: 12 % (ref 10.4–31.8)
TIBC: 349 ug/dL (ref 250–450)
UIBC: 306 ug/dL

## 2021-08-06 LAB — FERRITIN: Ferritin: 47 ng/mL (ref 11–307)

## 2021-08-08 ENCOUNTER — Inpatient Hospital Stay: Payer: Medicare Other

## 2021-08-08 ENCOUNTER — Other Ambulatory Visit: Payer: Self-pay

## 2021-08-08 ENCOUNTER — Encounter: Payer: Self-pay | Admitting: Oncology

## 2021-08-08 ENCOUNTER — Inpatient Hospital Stay (HOSPITAL_BASED_OUTPATIENT_CLINIC_OR_DEPARTMENT_OTHER): Payer: Medicare Other | Admitting: Oncology

## 2021-08-08 VITALS — BP 151/73 | HR 68 | Temp 97.3°F | Resp 16 | Wt 159.0 lb

## 2021-08-08 DIAGNOSIS — D500 Iron deficiency anemia secondary to blood loss (chronic): Secondary | ICD-10-CM | POA: Diagnosis not present

## 2021-08-08 DIAGNOSIS — D509 Iron deficiency anemia, unspecified: Secondary | ICD-10-CM | POA: Diagnosis not present

## 2021-08-08 NOTE — Progress Notes (Signed)
Pt in for follow up, reports still tired all the time but dizzy spells have improved.  Pt states currently not taking Eliquis for 72 hours, is having injection tomorrow, Pt states will resume on Friday.

## 2021-08-08 NOTE — Progress Notes (Signed)
Hematology/Oncology follow up note [facility] [phone] [phone]   Patient Care Team: Erasmo Downer, NP as PCP - General (Nurse Practitioner)  REFERRING PROVIDER: Erasmo Downer, NP CHIEF COMPLAINTS/REASON FOR VISIT:  Evaluation of iron deficiency anemia  HISTORY OF PRESENTING ILLNESS:  Kerri Steele is a  60 y.o.  female with PMH listed below was seen in consultation at the request of Erasmo Downer, NP   for evaluation of iron deficiency anemia.   05/07/2021, patient received 1 unit of PRBC transfusion due to hemoglobin less than 7. 05/19/2021 - 05/20/2021 patient presented emergency room for evaluation of dizziness and lightheadedness.  Hemoglobin was 7.1.  Patient received another unit of PRBC. Underwent EGD and colonoscopy without significant findings to explain her iron deficiency anemia.  No active bleeding spot or ulcer discovered. Small bowel capsule study was arranged with gastroenterology in September 2022.  Patient has a history of atrial fibrillation and has been on Eliquis 2.5 mg twice daily.  Patient also had acute on chronic COPD exacerbation.  Was treated with a course of prednisone. Today she reports ongoing feeling of dizziness.  Lightheaded, fatigue.  Denies any black or bloody stool.  INTERVAL HISTORY Kerri Steele is a 60 y.o. female who has above history reviewed by me today presents for follow up visit for management of iron deficiency anemia S/p IV venofer weeklyx 4 Fatigue has improved, but not back to her baseline. Still has dizzy spells.  She is on eliquis for Afib currently on hold for her upcoming Cervical ESI   Review of Systems  Constitutional:  Positive for fatigue. Negative for appetite change, chills and fever.  HENT:   Negative for hearing loss and voice change.   Eyes:  Negative for eye problems.  Respiratory:  Negative for chest tightness and cough.   Cardiovascular:  Negative for chest  pain.  Gastrointestinal:  Negative for abdominal distention, abdominal pain and blood in stool.  Endocrine: Negative for hot flashes.  Genitourinary:  Negative for difficulty urinating and frequency.   Musculoskeletal:  Negative for arthralgias.  Skin:  Negative for itching and rash.  Neurological:  Positive for dizziness and light-headedness. Negative for extremity weakness.  Hematological:  Negative for adenopathy.  Psychiatric/Behavioral:  Negative for confusion.   MEDICAL HISTORY:  Past Medical History:  Diagnosis Date   Anginal pain (HCC)    Anxiety    Asthma    COPD (chronic obstructive pulmonary disease) (HCC)    Depression    Diabetes mellitus without complication (HCC)    GERD (gastroesophageal reflux disease)    Has artificial bladder    Hypertension    Hypothyroidism    Pneumonia    Shortness of breath dyspnea    Sleep apnea     SURGICAL HISTORY: Past Surgical History:  Procedure Laterality Date   ANKLE SURGERY     BLADDER SURGERY     CARDIAC CATHETERIZATION Left 08/23/2015   Procedure: Left Heart Cath and Coronary Angiography;  Surgeon: Dalia Heading, MD;  Location: [facility] INVASIVE CV LAB;  Service: Cardiovascular;  Laterality: Left;   COLONOSCOPY WITH PROPOFOL N/A 05/20/2021   Procedure: COLONOSCOPY WITH PROPOFOL;  Surgeon: [name], MD;  Location: [facility] ENDOSCOPY;  Service: Endoscopy;  Laterality: N/A;   ESOPHAGEAL DILATION     ESOPHAGOGASTRODUODENOSCOPY (EGD) WITH PROPOFOL N/A 05/20/2021   Procedure: ESOPHAGOGASTRODUODENOSCOPY (EGD) WITH PROPOFOL;  Surgeon: [name], MD;  Location: [facility] ENDOSCOPY;  Service: Endoscopy;  Laterality: N/A;   LOOP RECORDER INSERTION N/A  06/01/2020   Procedure: LOOP RECORDER INSERTION;  Surgeon: Marcina Millard, MD;  Location: [facility] INVASIVE CV LAB;  Service: Cardiovascular;  Laterality: N/A;   TEE WITHOUT CARDIOVERSION N/A 06/01/2020   Procedure: TRANSESOPHAGEAL ECHOCARDIOGRAM (TEE);  Surgeon: Lamar Blinks, MD;   Location: [facility] ORS;  Service: Cardiovascular;  Laterality: N/A;   TUBAL LIGATION     WRIST SURGERY      SOCIAL HISTORY: Social History   Socioeconomic History   Marital status: Married    Spouse name: Not on file   Number of children: Not on file   Years of education: Not on file   Highest education level: Not on file  Occupational History   Not on file  Tobacco Use   Smoking status: Former    Packs/day: 2.00    Years: 35.00    Pack years: 70.00    Types: Cigarettes    Quit date: 01/25/2010    Years since quitting: 11.5   Smokeless tobacco: Never  Vaping Use   Vaping Use: Never used  Substance and Sexual Activity   Alcohol use: Not Currently    Comment: quit 6 years ago   Drug use: Yes    Comment: prescribed tramadol, oxycodone,and robaxin   Sexual activity: Not on file  Other Topics Concern   Not on file  Social History Narrative   Not on file   Social Determinants of Health   Financial Resource Strain: Not on file  Food Insecurity: Not on file  Transportation Needs: Not on file  Physical Activity: Not on file  Stress: Not on file  Social Connections: Not on file  Intimate Partner Violence: Not on file    FAMILY HISTORY: Family History  Problem Relation Age of Onset   Hypertension Mother    Diabetes Mother    Lupus Mother    Atrial fibrillation Mother    Heart disease Father    Acute myelogenous leukemia Nephew     ALLERGIES:  is allergic to metformin and related, morphine and related, other, and sulfa antibiotics.  MEDICATIONS:  Current Outpatient Medications  Medication Sig Dispense Refill   arformoterol (BROVANA) 15 MCG/2ML NEBU Take 15 mcg by nebulization 2 (two) times daily.     atorvastatin (LIPITOR) 40 MG tablet Take 1 tablet (40 mg total) by mouth daily. 90 tablet 0   DULoxetine (CYMBALTA) 60 MG capsule Take 60 mg by mouth daily.     glipiZIDE (GLUCOTROL XL) 10 MG 24 hr tablet Take 10 mg by mouth daily with breakfast.      HYDROcodone-acetaminophen (NORCO) 5-325 MG tablet Take 1 tablet by mouth every 6 (six) hours as needed for severe pain. 7 tablet 0   Ipratropium-Albuterol (COMBIVENT RESPIMAT) 20-100 MCG/ACT AERS respimat Inhale 1 puff into the lungs 4 (four) times daily.     magnesium oxide (MAG-OX) 400 MG tablet Take 400 mg by mouth 2 (two) times daily.      methocarbamol (ROBAXIN) 750 MG tablet Take 750 mg by mouth 3 (three) times daily.      pregabalin (LYRICA) 150 MG capsule Take 150 mg by mouth 3 (three) times daily.     tiotropium (SPIRIVA) 18 MCG inhalation capsule Place 18 mcg into inhaler and inhale daily.     traMADol (ULTRAM) 50 MG tablet Take 50 mg by mouth every 6 (six) hours as needed for moderate pain.     verapamil (CALAN-SR) 120 MG CR tablet Take 120 mg by mouth 2 (two) times daily. Pt reports taking twice a day  ELIQUIS 5 MG TABS tablet Take 5 mg by mouth 2 (two) times daily. (Patient not taking: Reported on 08/08/2021)     meclizine (ANTIVERT) 25 MG tablet Take by mouth. Take 25 mg by mouth once daily as needed (Patient not taking: Reported on 08/08/2021)     No current facility-administered medications for this visit.     PHYSICAL EXAMINATION: ECOG PERFORMANCE STATUS: 0 - Asymptomatic Vitals:   08/08/21 1408  BP: (!) 151/73  Pulse: 68  Resp: 16  Temp: (!) 97.3 F (36.3 C)  SpO2: 100%   Filed Weights   08/08/21 1408  Weight: 159 lb (72.1 kg)    Physical Exam Constitutional:      General: She is not in acute distress. HENT:     Head: Normocephalic and atraumatic.  Eyes:     General: No scleral icterus. Cardiovascular:     Rate and Rhythm: Normal rate and regular rhythm.     Heart sounds: Normal heart sounds.  Pulmonary:     Effort: Pulmonary effort is normal. No respiratory distress.     Breath sounds: No wheezing.  Abdominal:     General: Bowel sounds are normal. There is no distension.     Palpations: Abdomen is soft.  Musculoskeletal:        General: No  deformity. Normal range of motion.     Cervical back: Normal range of motion and neck supple.  Skin:    General: Skin is warm and dry.     Findings: No erythema or rash.  Neurological:     Mental Status: She is alert and oriented to person, place, and time. Mental status is at baseline.     Cranial Nerves: No cranial nerve deficit.     Coordination: Coordination normal.  Psychiatric:        Mood and Affect: Mood normal.          LABORATORY DATA:  I have reviewed the data as listed Lab Results  Component Value Date   WBC 9.3 08/06/2021   HGB 12.1 08/06/2021   HCT 39.4 08/06/2021   MCV 82.9 08/06/2021   PLT 283 08/06/2021   Recent Labs    05/07/21 1607 05/18/21 1938 05/20/21 0436  NA 139 139 142  K 4.1 4.4 3.5  CL 101 104 109  CO2 27 29 27   GLUCOSE 266* 130* 108*  BUN 19 17 16   CREATININE 1.38* 0.92 0.76  CALCIUM 9.9 9.2 9.3  GFRNONAA 44* >60 >60  PROT 7.1  --   --   ALBUMIN 3.8  --   --   AST 18  --   --   ALT 17  --   --   ALKPHOS 77  --   --   BILITOT 0.4  --   --     Iron/TIBC/Ferritin/ %Sat    Component Value Date/Time   IRON 43 08/06/2021 1358   IRON 63 08/26/2012 1011   TIBC 349 08/06/2021 1358   TIBC 330 08/26/2012 1011   FERRITIN 47 08/06/2021 1358   FERRITIN 3 (L) 06/16/2012 0825   IRONPCTSAT 12 08/06/2021 1358   IRONPCTSAT 19 08/26/2012 1011      RADIOGRAPHIC STUDIES: I have personally reviewed the radiological images as listed and agreed with the findings in the report. No results found.     ASSESSMENT & PLAN:  1. Iron deficiency anemia due to chronic blood loss    Labs are reviewed and discussed with patient. Both iron store and hemoglobin have normalized.  Hold off IV venofer today.  Follow up with GI for capsule endoscopy of small bowel.  Dizzy spells, likely not due to anemia given the persistence of symptoms despite improvement of anemia.  Follow up with PCP   All questions were answered. The patient knows to call the  clinic with any problems questions or concerns.  Cc Erasmo Downer, NP  Return of visit: 6 months lab cbc iron tibc ferritin   Rickard Patience, MD, PhD Hematology Oncology [facility] 08/08/2021

## 2021-08-30 ENCOUNTER — Other Ambulatory Visit: Payer: Self-pay

## 2021-08-30 ENCOUNTER — Observation Stay (HOSPITAL_COMMUNITY)
Admission: EM | Admit: 2021-08-30 | Discharge: 2021-08-31 | Disposition: A | Discharge status: Home / Self Care | Payer: Medicare Other | Attending: Internal Medicine | Admitting: Internal Medicine

## 2021-08-30 ENCOUNTER — Emergency Department (HOSPITAL_COMMUNITY): Payer: Medicare Other

## 2021-08-30 DIAGNOSIS — E1169 Type 2 diabetes mellitus with other specified complication: Secondary | ICD-10-CM | POA: Insufficient documentation

## 2021-08-30 DIAGNOSIS — Z20822 Contact with and (suspected) exposure to covid-19: Secondary | ICD-10-CM | POA: Diagnosis not present

## 2021-08-30 DIAGNOSIS — I639 Cerebral infarction, unspecified: Secondary | ICD-10-CM | POA: Diagnosis not present

## 2021-08-30 DIAGNOSIS — Y900 Blood alcohol level of less than 20 mg/100 ml: Secondary | ICD-10-CM | POA: Diagnosis not present

## 2021-08-30 DIAGNOSIS — R29898 Other symptoms and signs involving the musculoskeletal system: Secondary | ICD-10-CM

## 2021-08-30 DIAGNOSIS — I480 Paroxysmal atrial fibrillation: Secondary | ICD-10-CM | POA: Diagnosis not present

## 2021-08-30 DIAGNOSIS — D500 Iron deficiency anemia secondary to blood loss (chronic): Secondary | ICD-10-CM | POA: Diagnosis not present

## 2021-08-30 DIAGNOSIS — Z8673 Personal history of transient ischemic attack (TIA), and cerebral infarction without residual deficits: Secondary | ICD-10-CM | POA: Insufficient documentation

## 2021-08-30 DIAGNOSIS — Z7901 Long term (current) use of anticoagulants: Secondary | ICD-10-CM

## 2021-08-30 DIAGNOSIS — G43909 Migraine, unspecified, not intractable, without status migrainosus: Principal | ICD-10-CM | POA: Insufficient documentation

## 2021-08-30 DIAGNOSIS — Z87891 Personal history of nicotine dependence: Secondary | ICD-10-CM | POA: Diagnosis not present

## 2021-08-30 DIAGNOSIS — J45909 Unspecified asthma, uncomplicated: Secondary | ICD-10-CM | POA: Insufficient documentation

## 2021-08-30 DIAGNOSIS — H538 Other visual disturbances: Secondary | ICD-10-CM | POA: Insufficient documentation

## 2021-08-30 DIAGNOSIS — R531 Weakness: Secondary | ICD-10-CM | POA: Insufficient documentation

## 2021-08-30 DIAGNOSIS — E785 Hyperlipidemia, unspecified: Secondary | ICD-10-CM | POA: Diagnosis not present

## 2021-08-30 DIAGNOSIS — E039 Hypothyroidism, unspecified: Secondary | ICD-10-CM | POA: Diagnosis not present

## 2021-08-30 DIAGNOSIS — E119 Type 2 diabetes mellitus without complications: Secondary | ICD-10-CM | POA: Diagnosis not present

## 2021-08-30 DIAGNOSIS — Z7984 Long term (current) use of oral hypoglycemic drugs: Secondary | ICD-10-CM | POA: Diagnosis not present

## 2021-08-30 DIAGNOSIS — R519 Headache, unspecified: Secondary | ICD-10-CM

## 2021-08-30 DIAGNOSIS — I10 Essential (primary) hypertension: Secondary | ICD-10-CM | POA: Diagnosis not present

## 2021-08-30 DIAGNOSIS — Z79899 Other long term (current) drug therapy: Secondary | ICD-10-CM | POA: Insufficient documentation

## 2021-08-30 DIAGNOSIS — Q2112 Patent foramen ovale: Secondary | ICD-10-CM | POA: Insufficient documentation

## 2021-08-30 DIAGNOSIS — J449 Chronic obstructive pulmonary disease, unspecified: Secondary | ICD-10-CM | POA: Insufficient documentation

## 2021-08-30 LAB — I-STAT CHEM 8, ED
BUN: 11 mg/dL (ref 6–20)
Calcium, Ion: 1.18 mmol/L (ref 1.15–1.40)
Chloride: 102 mmol/L (ref 98–111)
Creatinine, Ser: 0.9 mg/dL (ref 0.44–1.00)
Glucose, Bld: 149 mg/dL — ABNORMAL HIGH (ref 70–99)
HCT: 45 % (ref 36.0–46.0)
Hemoglobin: 15.3 g/dL — ABNORMAL HIGH (ref 12.0–15.0)
Potassium: 3.6 mmol/L (ref 3.5–5.1)
Sodium: 142 mmol/L (ref 135–145)
TCO2: 28 mmol/L (ref 22–32)

## 2021-08-30 LAB — DIFFERENTIAL
Abs Immature Granulocytes: 0.04 10*3/uL (ref 0.00–0.07)
Basophils Absolute: 0.1 10*3/uL (ref 0.0–0.1)
Basophils Relative: 1 %
Eosinophils Absolute: 0.5 10*3/uL (ref 0.0–0.5)
Eosinophils Relative: 4 %
Immature Granulocytes: 0 %
Lymphocytes Relative: 27 %
Lymphs Abs: 3.4 10*3/uL (ref 0.7–4.0)
Monocytes Absolute: 0.9 10*3/uL (ref 0.1–1.0)
Monocytes Relative: 7 %
Neutro Abs: 7.8 10*3/uL — ABNORMAL HIGH (ref 1.7–7.7)
Neutrophils Relative %: 61 %

## 2021-08-30 LAB — COMPREHENSIVE METABOLIC PANEL
ALT: 33 U/L (ref 0–44)
AST: 29 U/L (ref 15–41)
Albumin: 4 g/dL (ref 3.5–5.0)
Alkaline Phosphatase: 89 U/L (ref 38–126)
Anion gap: 9 (ref 5–15)
BUN: 11 mg/dL (ref 6–20)
CO2: 28 mmol/L (ref 22–32)
Calcium: 9.9 mg/dL (ref 8.9–10.3)
Chloride: 102 mmol/L (ref 98–111)
Creatinine, Ser: 0.92 mg/dL (ref 0.44–1.00)
GFR, Estimated: >60 mL/min (ref >60)
Glucose, Bld: 157 mg/dL — ABNORMAL HIGH (ref 70–99)
Potassium: 3.6 mmol/L (ref 3.5–5.1)
Sodium: 139 mmol/L (ref 135–145)
Total Bilirubin: 0.6 mg/dL (ref 0.3–1.2)
Total Protein: 6.9 g/dL (ref 6.5–8.1)

## 2021-08-30 LAB — I-STAT BETA HCG BLOOD, ED (MC, WL, AP ONLY): I-stat hCG, quantitative: 27.8 m[IU]/mL — ABNORMAL HIGH (ref <5)

## 2021-08-30 LAB — RESP PANEL BY RT-PCR (FLU A&B, COVID) ARPGX2
Influenza A by PCR: NEGATIVE
Influenza B by PCR: NEGATIVE
SARS Coronavirus 2 by RT PCR: NEGATIVE

## 2021-08-30 LAB — CBC
HCT: 44.5 % (ref 36.0–46.0)
Hemoglobin: 14.3 g/dL (ref 12.0–15.0)
MCH: 26.3 pg (ref 26.0–34.0)
MCHC: 32.1 g/dL (ref 30.0–36.0)
MCV: 82 fL (ref 80.0–100.0)
Platelets: 348 10*3/uL (ref 150–400)
RBC: 5.43 MIL/uL — ABNORMAL HIGH (ref 3.87–5.11)
RDW: 18.3 % — ABNORMAL HIGH (ref 11.5–15.5)
WBC: 12.7 10*3/uL — ABNORMAL HIGH (ref 4.0–10.5)
nRBC: 0 % (ref 0.0–0.2)

## 2021-08-30 LAB — APTT: aPTT: 32 seconds (ref 24–36)

## 2021-08-30 LAB — PROTIME-INR
INR: 1 (ref 0.8–1.2)
Prothrombin Time: 13.5 seconds (ref 11.4–15.2)

## 2021-08-30 LAB — ETHANOL: Alcohol, Ethyl (B): <10 mg/dL (ref <10)

## 2021-08-30 LAB — MAGNESIUM: Magnesium: 2.2 mg/dL (ref 1.7–2.4)

## 2021-08-30 MED ORDER — METOCLOPRAMIDE HCL 5 MG/ML IJ SOLN
5.0000 mg | Freq: Once | INTRAMUSCULAR | Status: AC
  Administered 2021-08-30: 5 mg via INTRAVENOUS
  Filled 2021-08-30: qty 2

## 2021-08-30 MED ORDER — DIPHENHYDRAMINE HCL 50 MG/ML IJ SOLN
12.5000 mg | Freq: Once | INTRAMUSCULAR | Status: AC
  Administered 2021-08-30: 12.5 mg via INTRAVENOUS
  Filled 2021-08-30: qty 1

## 2021-08-30 MED ORDER — SODIUM CHLORIDE 0.9 % IV BOLUS
1000.0000 mL | Freq: Once | INTRAVENOUS | Status: AC
  Administered 2021-08-30: 1000 mL via INTRAVENOUS

## 2021-08-30 MED ORDER — MAGNESIUM SULFATE IN D5W 1-5 GM/100ML-% IV SOLN
1.0000 g | Freq: Once | INTRAVENOUS | Status: AC
  Administered 2021-08-30: 1 g via INTRAVENOUS
  Filled 2021-08-30: qty 100

## 2021-08-30 MED ORDER — DILTIAZEM HCL 25 MG/5ML IV SOLN
20.0000 mg | Freq: Once | INTRAVENOUS | Status: DC
  Filled 2021-08-30: qty 5

## 2021-08-30 NOTE — Consult Note (Signed)
Neurology Consult H&P  Kerri Steele MR# [id number] 08/30/2021   CC: Headache and blurred vision  History is obtained from: Patient and chart.  HPI: Kerri Steele is a 60 y.o. female PMHx as reviewed below A. fib on apixaban developed headache and right-sided blurry vision with associated numbness and weakness in right arm.  Symptoms developed 2 hours PTA.  States that she had a stroke 15 months ago with similar presentation No missed doses apixaban  There is no nausea vomiting.  LKW: 1730 tNK given: No apixaban IR Thrombectomy No LVO Modified Rankin Scale: 0-Completely asymptomatic and back to baseline post- stroke NIHSS: 5-exam fluctuated  ROS: A complete ROS was performed and is negative except as noted in the HPI.   Past Medical History:  Diagnosis Date   Anginal pain (HCC)    Anxiety    Asthma    COPD (chronic obstructive pulmonary disease) (HCC)    Depression    Diabetes mellitus without complication (HCC)    GERD (gastroesophageal reflux disease)    Has artificial bladder    Hypertension    Hypothyroidism    Pneumonia    Shortness of breath dyspnea    Sleep apnea      Family History  Problem Relation Age of Onset   Hypertension Mother    Diabetes Mother    Lupus Mother    Atrial fibrillation Mother    Heart disease Father    Acute myelogenous leukemia Nephew     Social History:  reports that she quit smoking about 11 years ago. Her smoking use included cigarettes. She has a 70.00 pack-year smoking history. She has never used smokeless tobacco. She reports that she does not currently use alcohol. She reports current drug use.   Prior to Admission medications   Medication Sig Start Date End Date Taking? Authorizing Provider  arformoterol (BROVANA) 15 MCG/2ML NEBU Take 15 mcg by nebulization 2 (two) times daily.    [provider]  atorvastatin (LIPITOR) 40 MG tablet Take 1 tablet (40 mg total) by mouth daily. 06/01/20 08/08/21  [place], Alvira Philips, DO   DULoxetine (CYMBALTA) 60 MG capsule Take 60 mg by mouth daily.    [provider]  ELIQUIS 5 MG TABS tablet Take 5 mg by mouth 2 (two) times daily. Patient not taking: Reported on 08/08/2021 05/15/21   [provider]  glipiZIDE (GLUCOTROL XL) 10 MG 24 hr tablet Take 10 mg by mouth daily with breakfast.    [provider]  HYDROcodone-acetaminophen (NORCO) 5-325 MG tablet Take 1 tablet by mouth every 6 (six) hours as needed for severe pain. 08/01/17   Merrily Brittle, MD  Ipratropium-Albuterol (COMBIVENT RESPIMAT) 20-100 MCG/ACT AERS respimat Inhale 1 puff into the lungs 4 (four) times daily.    [provider]  magnesium oxide (MAG-OX) 400 MG tablet Take 400 mg by mouth 2 (two) times daily.     [provider]  meclizine (ANTIVERT) 25 MG tablet Take by mouth. Take 25 mg by mouth once daily as needed Patient not taking: Reported on 08/08/2021 05/04/21   [provider]  methocarbamol (ROBAXIN) 750 MG tablet Take 750 mg by mouth 3 (three) times daily.     [provider]  pregabalin (LYRICA) 150 MG capsule Take 150 mg by mouth 3 (three) times daily. 05/22/20   [provider]  tiotropium (SPIRIVA) 18 MCG inhalation capsule Place 18 mcg into inhaler and inhale daily.    [provider]  traMADol (ULTRAM) 50 MG tablet  Take 50 mg by mouth every 6 (six) hours as needed for moderate pain.    [provider]  verapamil (CALAN-SR) 120 MG CR tablet Take 120 mg by mouth 2 (two) times daily. Pt reports taking twice a day 02/06/17   [provider]    Exam: Current vital signs: BP (!) 145/96 (BP Location: Left Arm)   Pulse (!) 117   Temp 98.7 F (37.1 C) (Oral)   Resp (!) 24   Ht 5' (1.524 m)   Wt 72.6 kg   LMP 07/30/2015 Comment: tubal ligation  SpO2 92%   BMI 31.25 kg/m   Physical Exam  Constitutional: Appears well-developed and well-nourished.  Psych: Affect appropriate to situation Eyes: No scleral  injection HENT: No OP obstruction. Head: Normocephalic.  Cardiovascular: Normal rate and regular rhythm.  Respiratory: Effort normal, symmetric excursions bilaterally, no audible wheezing. GI: Soft.  No distension. There is no tenderness.  Skin: WDI  Neuro: Mental Status: Patient is awake, alert, oriented to person, place, month, year, and situation. Patient is able to give a clear and coherent history. Speech fluent, intact comprehension and repetition. No signs of aphasia or neglect. Visual Fields are full. Pupils are equal, round, and reactive to light. EOMI without ptosis or diploplia.  Facial sensation is symmetric to temperature Facial movement is symmetric.  Hearing is intact to voice. Uvula midline and palate elevates symmetrically. Shoulder shrug is symmetric. Tongue is midline without atrophy or fasciculations.  Tone is increased in lower extremities. Bulk is normal.  Right lower extremity 4/5 strength. Sensation is symmetric to light touch and temperature in the arms and legs. Deep Tendon Reflexes: 2+ and symmetric in the biceps.  Lower extremities spastic reflexes 3+ with cross adduction bilaterally. Toes are downgoing bilaterally. FNF and HKS are intact bilaterally. Gait - Deferred  I have reviewed labs in epic and the pertinent results are: No labs were available at the time of evaluation.  I have reviewed the images obtained: NCT head showed no acute ischemic changes hemorrhage or mass lesion.  Aspects 10.  Assessment: Kerri Steele is a 60 y.o. female PMHx as noted above, atrial fibrillation (apixaban), previous stroke with sudden onset headache.  She is very anxious because the headache was her prior presentation of stroke.  She has exam suggesting spinal cord pathology however she does have very mild right lower extremity weakness which she says is new and she will need admission and further evaluation for stroke.  Plan: - MRI brain without contrast -  Ordered. - Recommend vascular imaging with MRA head and neck - Ordered. - Recommend TTE. - Recommend labs: HbA1c, lipid panel, TSH. - Recommend Statin if LDL > 70 -Continue apixaban. - SBP goal <160. - Telemetry monitoring for arrhythmia. - Recommend bedside Swallow screen. - Recommend Stroke education. - Recommend PT/OT/SLP consult.  This patient is critically ill and at significant risk of neurological worsening, death and care requires constant monitoring of vital signs, hemodynamics,respiratory and cardiac monitoring, neurological assessment, discussion with family, other specialists and medical decision making of high complexity. I spent 73 minutes of neurocritical care time  in the care of  this patient. This was time spent independent of any time provided by nurse practitioner or PA.  Electronically signed by:  Marisue Humble, MD Page: [phone] 08/30/2021, 7:39 PM

## 2021-08-30 NOTE — Code Documentation (Signed)
Responded to Code Stroke called on pt in triage. Pt arrived to ED at 1902 for headache. Code stroke initiated at 1936 for headache and visual changes. CBG-149, NIH-5, CTH negative for acute changes. TNK not given-pt on eliquis. Plan to tx for complicated migraine and workup for stroke.

## 2021-08-30 NOTE — ED Provider Notes (Signed)
[facility] EMERGENCY DEPARTMENT Provider Note   CSN: [id number] Arrival date & time: 08/30/21  1902     History Chief Complaint  Patient presents with   Headache    Lacresha Fusilier is a 60 y.o. female.  HPI  60 year old female past medical history of A. fib, HTN, DM, COPD presents the emergency department as a code stroke.  Patient initially presented with sudden onset of worst headache of her life associated with right arm weakness and numbness.  Onset approximately 2 hours prior to arrival.  Patient is anticoagulated on Eliquis for history of atrial fibrillation.  No admitted to facial droop, speech changes, vision changes.  Patient evaluated as a code stroke with neurology team at bedside, history limited secondary to acuity.  Patient has been compliant with her Eliquis.  Past Medical History:  Diagnosis Date   Anginal pain (HCC)    Anxiety    Asthma    COPD (chronic obstructive pulmonary disease) (HCC)    Depression    Diabetes mellitus without complication (HCC)    GERD (gastroesophageal reflux disease)    Has artificial bladder    Hypertension    Hypothyroidism    Pneumonia    Shortness of breath dyspnea    Sleep apnea     Patient Active Problem List   Diagnosis Date Noted   Symptomatic anemia 05/19/2021   COPD exacerbation (HCC) 05/19/2021   Iron deficiency anemia due to chronic blood loss    PFO (patent foramen ovale) 06/01/2020   Benign essential HTN 06/01/2020   Type 2 diabetes mellitus with hyperlipidemia (HCC) 06/01/2020   COPD without exacerbation (HCC) 06/01/2020   GERD (gastroesophageal reflux disease) 06/01/2020   HLD (hyperlipidemia) 06/01/2020   Chronic pain disorder 06/01/2020   Stroke (HCC) 05/28/2020   Acute colitis 07/30/2016   Breath shortness 10/18/2012    Past Surgical History:  Procedure Laterality Date   ANKLE SURGERY     BLADDER SURGERY     CARDIAC CATHETERIZATION Left 08/23/2015   Procedure: Left Heart Cath and  Coronary Angiography;  Surgeon: Dalia Heading, MD;  Location: [facility] INVASIVE CV LAB;  Service: Cardiovascular;  Laterality: Left;   COLONOSCOPY WITH PROPOFOL N/A 05/20/2021   Procedure: COLONOSCOPY WITH PROPOFOL;  Surgeon: [name], MD;  Location[facility] ENDOSCOPY;  Service: Endoscopy;  Laterality: N/A;   ESOPHAGEAL DILATION     ESOPHAGOGASTRODUODENOSCOPY (EGD) WITH PROPOFOL N/A 05/20/2021   Procedure: ESOPHAGOGASTRODUODENOSCOPY (EGD) WITH PROPOFOL;  Surgeon: [name], MD;  Location: [facility] ENDOSCOPY;  Service: Endoscopy;  Laterality: N/A;   LOOP RECORDER INSERTION N/A 06/01/2020   Procedure: LOOP RECORDER INSERTION;  Surgeon: Marcina Millard, MD;  Location: [facility] INVASIVE CV LAB;  Service: Cardiovascular;  Laterality: N/A;   TEE WITHOUT CARDIOVERSION N/A 06/01/2020   Procedure: TRANSESOPHAGEAL ECHOCARDIOGRAM (TEE);  Surgeon: Lamar Blinks, MD;  Location: [facility] ORS;  Service: Cardiovascular;  Laterality: N/A;   TUBAL LIGATION     WRIST SURGERY       OB History   No obstetric history on file.     Family History  Problem Relation Age of Onset   Hypertension Mother    Diabetes Mother    Lupus Mother    Atrial fibrillation Mother    Heart disease Father    Acute myelogenous leukemia Nephew     Social History   Tobacco Use   Smoking status: Former    Packs/day: 2.00    Years: 35.00    Pack years: 70.00    Types: Cigarettes  Quit date: 01/25/2010    Years since quitting: 11.6   Smokeless tobacco: Never  Vaping Use   Vaping Use: Never used  Substance Use Topics   Alcohol use: Not Currently    Comment: quit 6 years ago   Drug use: Yes    Comment: prescribed tramadol, oxycodone,and robaxin    Home Medications Prior to Admission medications   Medication Sig Start Date End Date Taking? Authorizing Provider  arformoterol (BROVANA) 15 MCG/2ML NEBU Take 15 mcg by nebulization 2 (two) times daily.    [provider]  atorvastatin (LIPITOR) 40 MG tablet Take 1  tablet (40 mg total) by mouth daily. 06/01/20 08/08/21  [place], Alvira Philips, DO  DULoxetine (CYMBALTA) 60 MG capsule Take 60 mg by mouth daily.    [provider]  ELIQUIS 5 MG TABS tablet Take 5 mg by mouth 2 (two) times daily. Patient not taking: Reported on 08/08/2021 05/15/21   [provider]  glipiZIDE (GLUCOTROL XL) 10 MG 24 hr tablet Take 10 mg by mouth daily with breakfast.    [provider]  HYDROcodone-acetaminophen (NORCO) 5-325 MG tablet Take 1 tablet by mouth every 6 (six) hours as needed for severe pain. 08/01/17   Merrily Brittle, MD  Ipratropium-Albuterol (COMBIVENT RESPIMAT) 20-100 MCG/ACT AERS respimat Inhale 1 puff into the lungs 4 (four) times daily.    [provider]  magnesium oxide (MAG-OX) 400 MG tablet Take 400 mg by mouth 2 (two) times daily.     [provider]  meclizine (ANTIVERT) 25 MG tablet Take by mouth. Take 25 mg by mouth once daily as needed Patient not taking: Reported on 08/08/2021 05/04/21   [provider]  methocarbamol (ROBAXIN) 750 MG tablet Take 750 mg by mouth 3 (three) times daily.     [provider]  pregabalin (LYRICA) 150 MG capsule Take 150 mg by mouth 3 (three) times daily. 05/22/20   [provider]  tiotropium (SPIRIVA) 18 MCG inhalation capsule Place 18 mcg into inhaler and inhale daily.    [provider]  traMADol (ULTRAM) 50 MG tablet Take 50 mg by mouth every 6 (six) hours as needed for moderate pain.    [provider]  verapamil (CALAN-SR) 120 MG CR tablet Take 120 mg by mouth 2 (two) times daily. Pt reports taking twice a day 02/06/17   [provider]    Allergies    Metformin and related, Morphine and related, Other, and Sulfa antibiotics  Review of Systems   Review of Systems  Constitutional:  Negative for chills and fever.  HENT:  Negative for congestion.   Eyes:  Negative for visual disturbance.  Respiratory:  Negative for shortness of  breath.   Cardiovascular:  Negative for chest pain.  Gastrointestinal:  Negative for abdominal pain and nausea.  Genitourinary:  Negative for dysuria.  Skin:  Negative for rash.  Neurological:  Positive for weakness, numbness and headaches. Negative for seizures, syncope, facial asymmetry and speech difficulty.   Physical Exam Updated Vital Signs BP 113/67   Pulse 79   Temp 98.7 F (37.1 C) (Oral)   Resp 19   Ht 5' (1.524 m)   Wt 72.6 kg   LMP 07/30/2015 Comment: tubal ligation  SpO2 99%   BMI 31.25 kg/m   Physical Exam Vitals and nursing note reviewed.  Constitutional:      Appearance: Normal appearance.  HENT:     Head: Normocephalic.     Mouth/Throat:     Mouth:  Mucous membranes are moist.  Eyes:     General: No visual field deficit.    Extraocular Movements: Extraocular movements intact.     Pupils: Pupils are equal, round, and reactive to light.  Cardiovascular:     Rate and Rhythm: Tachycardia present.  Pulmonary:     Effort: Pulmonary effort is normal. No respiratory distress.  Abdominal:     Palpations: Abdomen is soft.     Tenderness: There is no abdominal tenderness.  Skin:    General: Skin is warm.  Neurological:     Mental Status: She is alert and oriented to person, place, and time. Mental status is at baseline.     Cranial Nerves: No cranial nerve deficit, dysarthria or facial asymmetry.  Psychiatric:        Mood and Affect: Mood is anxious.    ED Results / Procedures / Treatments   Labs (all labs ordered are listed, but only abnormal results are displayed) Labs Reviewed  CBC - Abnormal; Notable for the following components:      Result Value   WBC 12.7 (*)    RBC 5.43 (*)    RDW 18.3 (*)    All other components within normal limits  DIFFERENTIAL - Abnormal; Notable for the following components:   Neutro Abs 7.8 (*)    All other components within normal limits  COMPREHENSIVE METABOLIC PANEL - Abnormal; Notable for the following components:    Glucose, Bld 157 (*)    All other components within normal limits  I-STAT CHEM 8, ED - Abnormal; Notable for the following components:   Glucose, Bld 149 (*)    Hemoglobin 15.3 (*)    All other components within normal limits  I-STAT BETA HCG BLOOD, ED ([facility], [facility], [facility] ONLY) - Abnormal; Notable for the following components:   I-stat hCG, quantitative 27.8 (*)    All other components within normal limits  RESP PANEL BY RT-PCR (FLU A&B, COVID) ARPGX2  ETHANOL  PROTIME-INR  APTT  MAGNESIUM  RAPID URINE DRUG SCREEN, HOSP PERFORMED  URINALYSIS, ROUTINE W REFLEX MICROSCOPIC    EKG EKG Interpretation  Date/Time:  Thursday August 30 2021 21:44:43 EDT Ventricular Rate:  90 PR Interval:  125 QRS Duration: 94 QT Interval:  385 QTC Calculation: 472 R Axis:   74 Text Interpretation: Sinus rhythm Minimal ST depression, inferior leads NSR Confirmed by Coralee Pesa [phone]) on 08/30/2021 10:15:17 PM  Radiology CT HEAD CODE STROKE WO CONTRAST  Result Date: 08/30/2021 CLINICAL DATA:  Code stroke.  Headache, visual changes EXAM: CT HEAD WITHOUT CONTRAST TECHNIQUE: Contiguous axial images were obtained from the base of the skull through the vertex without intravenous contrast. COMPARISON:  CT head 05/28/2020 FINDINGS: Brain: No evidence of acute infarction, hemorrhage, cerebral edema, mass, mass effect, or midline shift. Ventricles and sulci are normal for age. No extra-axial fluid collection. Vascular: No hyperdense vessel or unexpected calcification. Skull: Normal. Negative for fracture or focal lesion. Sinuses/Orbits: No acute finding. Other: Trace fluid in left mastoid air cells. ASPECTS [facility] Stroke Program Early CT Score) - Ganglionic level infarction (caudate, lentiform nuclei, internal capsule, insula, M1-M3 cortex): 7 - Supraganglionic infarction (M4-M6 cortex): 3 Total score (0-10 with 10 being normal): 10 IMPRESSION: 1. No acute intracranial process. 2. ASPECTS is 10 Code stroke imaging  results were communicated on 08/30/2021 at 7:49 pm to provider [facility] via secure text paging. Electronically Signed   By: Wiliam Ke M.D.   On: 08/30/2021 19:50    Procedures .Critical Care Performed  by: Rozelle Logan, DO Authorized by: Rozelle Logan, DO   Critical care provider statement:    Critical care time (minutes):  40   Critical care time was exclusive of:  Separately billable procedures and treating other patients   Critical care was necessary to treat or prevent imminent or life-threatening deterioration of the following conditions:  CNS failure or compromise   Critical care was time spent personally by me on the following activities:  Development of treatment plan with patient or surrogate, discussions with consultants, evaluation of patient's response to treatment, re-evaluation of patient's condition, ordering and review of laboratory studies and ordering and review of radiographic studies   Care discussed with: admitting provider     Medications Ordered in ED Medications  diltiazem (CARDIZEM) injection 20 mg (0 mg Intravenous Hold 08/30/21 2152)  magnesium sulfate IVPB 1 g 100 mL (1 g Intravenous New Bag/Given 08/30/21 2309)  sodium chloride 0.9 % bolus 1,000 mL (0 mLs Intravenous Stopped 08/30/21 2101)  metoCLOPramide (REGLAN) injection 5 mg (5 mg Intravenous Given 08/30/21 2144)  diphenhydrAMINE (BENADRYL) injection 12.5 mg (12.5 mg Intravenous Given 08/30/21 2149)    ED Course  I have reviewed the triage vital signs and the nursing notes.  Pertinent labs & imaging results that were available during my care of the patient were reviewed by me and considered in my medical decision making (see chart for details).    MDM Rules/Calculators/A&P                           60 year old female presents emergency department as a code stroke complaining of sudden onset worse headache of her life and right-sided weakness.  Neurology team at bedside, patient evaluated as  a code stroke.  CT of the head shows no acute bleed or abnormal finding.  Of note patient is in atrial fibrillation with RVR.  Metabolic work-up has been reassuring.  Patient was given a migraine-like cocktail for possible complicated migraine.  Patient spontaneously converted back to normal sinus rhythm.  Neurology recommends admission for stroke work-up including MRI/MRA, TTE and further evaluation.  Patient's symptoms have improved, headache is slightly improved with migraine like cocktail but not resolved.  Patients evaluation and results requires admission for further treatment and care. Patient agrees with admission plan, offers no new complaints and is stable/unchanged at time of admit.  Final Clinical Impression(s) / ED Diagnoses Final diagnoses:  None    Rx / DC Orders ED Discharge Orders     None        Rozelle Logan, DO 08/30/21 2322

## 2021-08-30 NOTE — ED Triage Notes (Signed)
Pt c/o headache for the past 2 hours. Pt denies history of migraines.

## 2021-08-30 NOTE — ED Provider Notes (Addendum)
Emergency Medicine Provider Triage Evaluation Note  Kerri Steele , a 60 y.o. female  was evaluated in triage.  Pt complains of acute sudden onset of the worst headache of her life.  Started 2 hours ago, also having right-sided blurry vision which is constant.  She feels numb and weakness in her right arm which started at the same time..  Patient reports history of a stroke 15 months ago, states this feels similar.  Patient reports she is on Eliquis, denies any missed doses recently.  Review of Systems  Positive: Headache, right arm numbness, blurry vision in the right eye Negative: Chest pain  Physical Exam  BP (!) 145/96 (BP Location: Left Arm)   Pulse (!) 117   Temp 98.7 F (37.1 C) (Oral)   Resp (!) 24   LMP 07/30/2015 Comment: tubal ligation  SpO2 92%  Gen:   Awake, no distress   Resp:  Normal effort  MSK:   Moves extremities without difficulty  Other:  Right arm drop, decree sensation to the right arm as well as decreased grip strength to the right arm.  Cranial nerves III through XII are grossly intact.  Medical Decision Making  Medically screening exam initiated at 7:23 PM.  Appropriate orders placed.  Kerri Steele was informed that the remainder of the evaluation will be completed by another provider, this initial triage assessment does not replace that evaluation, and the importance of remaining in the ED until their evaluation is complete.  Code stroke   Theron Arista, [place] 08/30/21 1924    Theron Arista, PA-C 08/30/21 1925    Rozelle Logan, DO 08/30/21 2358

## 2021-08-31 ENCOUNTER — Observation Stay (HOSPITAL_BASED_OUTPATIENT_CLINIC_OR_DEPARTMENT_OTHER): Payer: Medicare Other

## 2021-08-31 ENCOUNTER — Observation Stay (HOSPITAL_COMMUNITY): Payer: Medicare Other

## 2021-08-31 ENCOUNTER — Encounter (HOSPITAL_COMMUNITY): Payer: Self-pay | Admitting: Family Medicine

## 2021-08-31 DIAGNOSIS — I10 Essential (primary) hypertension: Secondary | ICD-10-CM | POA: Diagnosis not present

## 2021-08-31 DIAGNOSIS — I4821 Permanent atrial fibrillation: Secondary | ICD-10-CM

## 2021-08-31 DIAGNOSIS — I4891 Unspecified atrial fibrillation: Secondary | ICD-10-CM

## 2021-08-31 DIAGNOSIS — G43109 Migraine with aura, not intractable, without status migrainosus: Secondary | ICD-10-CM

## 2021-08-31 DIAGNOSIS — Z8673 Personal history of transient ischemic attack (TIA), and cerebral infarction without residual deficits: Secondary | ICD-10-CM | POA: Diagnosis not present

## 2021-08-31 DIAGNOSIS — R519 Headache, unspecified: Secondary | ICD-10-CM | POA: Diagnosis not present

## 2021-08-31 DIAGNOSIS — E1169 Type 2 diabetes mellitus with other specified complication: Secondary | ICD-10-CM | POA: Diagnosis not present

## 2021-08-31 LAB — URINALYSIS, ROUTINE W REFLEX MICROSCOPIC
Bilirubin Urine: NEGATIVE
Glucose, UA: NEGATIVE mg/dL
Hgb urine dipstick: NEGATIVE
Ketones, ur: 5 mg/dL — AB
Leukocytes,Ua: NEGATIVE
Nitrite: NEGATIVE
Protein, ur: NEGATIVE mg/dL
Specific Gravity, Urine: 1.009 (ref 1.005–1.030)
pH: 9 — ABNORMAL HIGH (ref 5.0–8.0)

## 2021-08-31 LAB — RAPID URINE DRUG SCREEN, HOSP PERFORMED
Amphetamines: NOT DETECTED
Barbiturates: NOT DETECTED
Benzodiazepines: NOT DETECTED
Cocaine: NOT DETECTED
Opiates: NOT DETECTED
Tetrahydrocannabinol: NOT DETECTED

## 2021-08-31 LAB — ECHOCARDIOGRAM COMPLETE
Area-P 1/2: 5.23 cm2
Height: 60 in
P 1/2 time: 480 msec
S' Lateral: 2.4 cm
Weight: 2560 oz

## 2021-08-31 LAB — GLUCOSE, CAPILLARY: Glucose-Capillary: 122 mg/dL — ABNORMAL HIGH (ref 70–99)

## 2021-08-31 LAB — LIPID PANEL
Cholesterol: 124 mg/dL (ref 0–200)
HDL: 36 mg/dL — ABNORMAL LOW (ref >40)
LDL Cholesterol: 66 mg/dL (ref 0–99)
Total CHOL/HDL Ratio: 3.4 RATIO
Triglycerides: 112 mg/dL (ref <150)
VLDL: 22 mg/dL (ref 0–40)

## 2021-08-31 LAB — C-REACTIVE PROTEIN: CRP: 1 mg/dL — ABNORMAL HIGH (ref <1.0)

## 2021-08-31 LAB — CBG MONITORING, ED
Glucose-Capillary: 102 mg/dL — ABNORMAL HIGH (ref 70–99)
Glucose-Capillary: 100 mg/dL — ABNORMAL HIGH (ref 70–99)

## 2021-08-31 LAB — HEMOGLOBIN A1C
Hgb A1c MFr Bld: 6.6 % — ABNORMAL HIGH (ref 4.8–5.6)
Mean Plasma Glucose: 142.72 mg/dL

## 2021-08-31 LAB — SEDIMENTATION RATE: Sed Rate: 22 mm/hr (ref 0–22)

## 2021-08-31 MED ORDER — ATORVASTATIN CALCIUM 40 MG PO TABS
40.0000 mg | ORAL_TABLET | Freq: Every day | ORAL | Status: DC
  Administered 2021-08-31: 40 mg via ORAL
  Filled 2021-08-31: qty 1

## 2021-08-31 MED ORDER — STROKE: EARLY STAGES OF RECOVERY BOOK: Freq: Once | Status: AC

## 2021-08-31 MED ORDER — INSULIN ASPART 100 UNIT/ML IJ SOLN: 0.0000 [IU] | Freq: Three times a day (TID) | INTRAMUSCULAR | Status: DC

## 2021-08-31 MED ORDER — TRAMADOL HCL 50 MG PO TABS
50.0000 mg | ORAL_TABLET | Freq: Four times a day (QID) | ORAL | Status: DC | PRN
Start: 2021-08-31 — End: 2021-08-31
  Administered 2021-08-31: 50 mg via ORAL
  Filled 2021-08-31: qty 1

## 2021-08-31 MED ORDER — APIXABAN 5 MG PO TABS
5.0000 mg | ORAL_TABLET | Freq: Two times a day (BID) | ORAL | Status: DC
  Administered 2021-08-31 (×2): 5 mg via ORAL
  Filled 2021-08-31 (×2): qty 1

## 2021-08-31 MED ORDER — ACETAMINOPHEN 325 MG PO TABS
650.0000 mg | ORAL_TABLET | ORAL | Status: DC | PRN
  Administered 2021-08-31: 650 mg via ORAL
  Filled 2021-08-31: qty 2

## 2021-08-31 NOTE — H&P (Addendum)
History and Physical    Kerri Steele XHB:[id number] DOB: [birth year] DOA: 08/30/2021  PCP: Erasmo Downer, NP  Patient coming from: Home  I have personally briefly reviewed patient's old medical records in [facility]  Chief Complaint: Headache, blurry vision and right-sided weakness  HPI: Kerri Steele is a 60 y.o. female with medical history significant for CVA with PFO pending repair, paroxysmal atrial fibrillation on Eliquis, COPD with nocturnal hypoxia requiring 2 L, type 2 diabetes, iron deficiency anemia with history requiring IV iron and transfusion, hyperlipidemia and GERD who presents with concerns of severe headache, blurry vision and right-sided weakness.  Patient reports that around 1650 she noticed sudden acute onset of right-sided headache with right blurry vision.  Also notes right upper and lower extremity weakness.  Patient states symptoms feel similar to her previous stroke where she had a generalized headache with right-sided weakness and blurry vision as well.  Husband at bedside reports that she had unsteady gait and had some intermittent confusion about where she was throughout today due to the headache.  Patient is on Eliquis for atrial fibrillation and reports compliance.  Her last stroke was in 05/2020 where she was found to have acute left occipital CVA and received tPA.  No large vessel occlusion was seen on CTA head.  She had echocardiogram with small patent foramina ovale and had placement of loop recorder. Her PFO repair is postponed due to iron deficiency anemia back in July requiring multiple blood transfusions and repeated IV iron infusions.  ED Course: She presented as a code stroke evaluated by neurology and had negative CT head.  She was noted to be afebrile, normotensive and initially in atrial fibrillation with RVR with rates of 120 but spontaneously converted back to sinus without any intervention.  CBC shows leukocytosis of 12.7, hemoglobin of  14.3.  Sodium 139, potassium 3.6, creatinine of 0.92, BG of 157.  She was given IV Reglan, magnesium and Benadryl with some improvement to her headache but she continues to have persistent blurry vision and right upper extremity weakness.  Hospitalist on-call for admission of further stroke work-up.  Review of Systems: Constitutional: No Weight Change, No Fever ENT/Mouth: No sore throat, No Rhinorrhea Eyes: No Eye Pain, +Vision Changes Cardiovascular: No Chest Pain, no SOB, No Edema, No Palpitations Respiratory: No Cough Gastrointestinal: + Nausea, No Vomiting, No Diarrhea, No Constipation, No Pain Genitourinary: no Urinary Incontinence Musculoskeletal: No Arthralgias, No Myalgias Skin: No Skin Lesions, No Pruritus, Neuro:+ Weakness, + Numbness,  No Loss of Consciousness, No Syncope Psych: No Anxiety/Panic, No Depression, no decrease appetite Heme/Lymph: No Bruising, No Bleeding  Past Medical History:  Diagnosis Date   Anginal pain (HCC)    Anxiety    Asthma    COPD (chronic obstructive pulmonary disease) (HCC)    Depression    Diabetes mellitus without complication (HCC)    GERD (gastroesophageal reflux disease)    Has artificial bladder    Hypertension    Hypothyroidism    Pneumonia    Shortness of breath dyspnea    Sleep apnea     Past Surgical History:  Procedure Laterality Date   ANKLE SURGERY     BLADDER SURGERY     CARDIAC CATHETERIZATION Left 08/23/2015   Procedure: Left Heart Cath and Coronary Angiography;  Surgeon: Dalia Heading, MD;  Location: [facility] INVASIVE CV LAB;  Service: Cardiovascular;  Laterality: Left;   COLONOSCOPY WITH PROPOFOL N/A 05/20/2021   Procedure: COLONOSCOPY WITH PROPOFOL;  Surgeon: [name], MD;  Location: [facility] ENDOSCOPY;  Service: Endoscopy;  Laterality: N/A;   ESOPHAGEAL DILATION     ESOPHAGOGASTRODUODENOSCOPY (EGD) WITH PROPOFOL N/A 05/20/2021   Procedure: ESOPHAGOGASTRODUODENOSCOPY (EGD) WITH PROPOFOL;  Surgeon: [name], MD;   Location: [facility] ENDOSCOPY;  Service: Endoscopy;  Laterality: N/A;   LOOP RECORDER INSERTION N/A 06/01/2020   Procedure: LOOP RECORDER INSERTION;  Surgeon: Marcina Millard, MD;  Location: [facility] INVASIVE CV LAB;  Service: Cardiovascular;  Laterality: N/A;   TEE WITHOUT CARDIOVERSION N/A 06/01/2020   Procedure: TRANSESOPHAGEAL ECHOCARDIOGRAM (TEE);  Surgeon: Lamar Blinks, MD;  Location: [facility] ORS;  Service: Cardiovascular;  Laterality: N/A;   TUBAL LIGATION     WRIST SURGERY       reports that she quit smoking about 11 years ago. Her smoking use included cigarettes. She has a 70.00 pack-year smoking history. She has never used smokeless tobacco. She reports that she does not currently use alcohol. She reports current drug use. Social History  Allergies  Allergen Reactions   Metformin And Related Other (See Comments)    migraine   Morphine And Related Hives   Other Other (See Comments)    Latex allergy   Sulfa Antibiotics Other (See Comments)    Family History  Problem Relation Age of Onset   Hypertension Mother    Diabetes Mother    Lupus Mother    Atrial fibrillation Mother    Heart disease Father    Acute myelogenous leukemia Nephew      Prior to Admission medications   Medication Sig Start Date End Date Taking? Authorizing Provider  arformoterol (BROVANA) 15 MCG/2ML NEBU Take 15 mcg by nebulization 2 (two) times daily.    [provider]  atorvastatin (LIPITOR) 40 MG tablet Take 1 tablet (40 mg total) by mouth daily. 06/01/20 08/08/21  [place], Alvira Philips, DO  DULoxetine (CYMBALTA) 60 MG capsule Take 60 mg by mouth daily.    [provider]  ELIQUIS 5 MG TABS tablet Take 5 mg by mouth 2 (two) times daily. Patient not taking: Reported on 08/08/2021 05/15/21   [provider]  glipiZIDE (GLUCOTROL XL) 10 MG 24 hr tablet Take 10 mg by mouth daily with breakfast.    [provider]  HYDROcodone-acetaminophen (NORCO) 5-325 MG tablet Take 1 tablet by  mouth every 6 (six) hours as needed for severe pain. 08/01/17   Merrily Brittle, MD  Ipratropium-Albuterol (COMBIVENT RESPIMAT) 20-100 MCG/ACT AERS respimat Inhale 1 puff into the lungs 4 (four) times daily.    [provider]  magnesium oxide (MAG-OX) 400 MG tablet Take 400 mg by mouth 2 (two) times daily.     [provider]  meclizine (ANTIVERT) 25 MG tablet Take by mouth. Take 25 mg by mouth once daily as needed Patient not taking: Reported on 08/08/2021 05/04/21   [provider]  methocarbamol (ROBAXIN) 750 MG tablet Take 750 mg by mouth 3 (three) times daily.     [provider]  pregabalin (LYRICA) 150 MG capsule Take 150 mg by mouth 3 (three) times daily. 05/22/20   [provider]  tiotropium (SPIRIVA) 18 MCG inhalation capsule Place 18 mcg into inhaler and inhale daily.    [provider]  traMADol (ULTRAM) 50 MG tablet Take 50 mg by mouth every 6 (six) hours as needed for moderate pain.    [provider]  verapamil (CALAN-SR) 120 MG CR tablet Take 120 mg by mouth 2 (two) times daily. Pt reports taking twice a day 02/06/17   [provider]    Physical Exam: Vitals:   08/30/21 2245 08/30/21 2300 08/30/21 2330 08/31/21 0000  BP:  113/67    Pulse: 84 79 73 69  Resp:  19 20 20   Temp:      TempSrc:      SpO2: 98% 99% 98% 97%  Weight:      Height:        Constitutional: NAD, calm, comfortable, middle-aged female sitting upright in bed Vitals:   08/30/21 2245 08/30/21 2300 08/30/21 2330 08/31/21 0000  BP:  113/67    Pulse: 84 79 73 69  Resp:  19 20 20   Temp:      TempSrc:      SpO2: 98% 99% 98% 97%  Weight:      Height:       Eyes: PERRL, lids and conjunctivae normal ENMT: Mucous membranes are moist.  Neck: normal, supple Respiratory: clear to auscultation bilaterally, no wheezing, no crackles. Normal respiratory effort. No accessory muscle use.  Cardiovascular: Regular rate and rhythm, no murmurs / rubs  / gallops. No extremity edema. 2+ pedal pulses.   Abdomen: no tenderness, no masses palpated. Bowel sounds positive.  Musculoskeletal: no clubbing / cyanosis. No joint deformity upper and lower extremities. Normal muscle tone.  Skin: no rashes, lesions, ulcers. No induration Neurologic: CN 2-12 grossly intact. Sensation intact, Strength 4/5 the right upper extremity with weak handgrip.  Intact finger-nose although endorsed blurry vision of the right eye.   Psychiatric: Normal judgment and insight. Alert and oriented x 3. Normal mood.     Labs on Admission: I have personally reviewed following labs and imaging studies  CBC: Recent Labs  Lab 08/30/21 1930 08/30/21 1938  WBC 12.7*  --   NEUTROABS 7.8*  --   HGB 14.3 15.3*  HCT 44.5 45.0  MCV 82.0  --   PLT 348  --    Basic Metabolic Panel: Recent Labs  Lab 08/30/21 1930 08/30/21 1938 08/30/21 2018  NA 139 142  --   K 3.6 3.6  --   CL 102 102  --   CO2 28  --   --   GLUCOSE 157* 149*  --   BUN 11 11  --   CREATININE 0.92 0.90  --   CALCIUM 9.9  --   --   MG  --   --  2.2   GFR: Estimated Creatinine Clearance: 60.6 mL/min (by C-G formula based on SCr of 0.9 mg/dL). Liver Function Tests: Recent Labs  Lab 08/30/21 1930  AST 29  ALT 33  ALKPHOS 89  BILITOT 0.6  PROT 6.9  ALBUMIN 4.0   No results for input(s): LIPASE, AMYLASE in the last 168 hours. No results for input(s): AMMONIA in the last 168 hours. Coagulation Profile: Recent Labs  Lab 08/30/21 1930  INR 1.0   Cardiac Enzymes: No results for input(s): CKTOTAL, CKMB, CKMBINDEX, TROPONINI in the last 168 hours. BNP (last 3 results) No results for input(s): PROBNP in the last 8760 hours. HbA1C: No results for input(s): HGBA1C in the last 72 hours. CBG: No results for input(s): GLUCAP in the last 168 hours. Lipid Profile: No results for input(s): CHOL, HDL, LDLCALC, TRIG, CHOLHDL, LDLDIRECT in the last 72 hours. Thyroid Function Tests: No results for  input(s): TSH, T4TOTAL, FREET4, T3FREE, THYROIDAB in the last 72 hours. Anemia Panel: No results for input(s): VITAMINB12, FOLATE, FERRITIN, TIBC, IRON, RETICCTPCT in the last 72 hours. Urine analysis:    Component Value Date/Time  COLORURINE COLORLESS (A) 09/06/2019 0917   APPEARANCEUR CLEAR (A) 09/06/2019 0917   LABSPEC 1.030 09/06/2019 0917   PHURINE 7.0 09/06/2019 0917   GLUCOSEU NEGATIVE 09/06/2019 0917   HGBUR NEGATIVE 09/06/2019 0917   BILIRUBINUR NEGATIVE 09/06/2019 0917   KETONESUR NEGATIVE 09/06/2019 0917   PROTEINUR NEGATIVE 09/06/2019 0917   NITRITE NEGATIVE 09/06/2019 0917   LEUKOCYTESUR NEGATIVE 09/06/2019 0917    Radiological Exams on Admission: CT HEAD CODE STROKE WO CONTRAST  Result Date: 08/30/2021 CLINICAL DATA:  Code stroke.  Headache, visual changes EXAM: CT HEAD WITHOUT CONTRAST TECHNIQUE: Contiguous axial images were obtained from the base of the skull through the vertex without intravenous contrast. COMPARISON:  CT head 05/28/2020 FINDINGS: Brain: No evidence of acute infarction, hemorrhage, cerebral edema, mass, mass effect, or midline shift. Ventricles and sulci are normal for age. No extra-axial fluid collection. Vascular: No hyperdense vessel or unexpected calcification. Skull: Normal. Negative for fracture or focal lesion. Sinuses/Orbits: No acute finding. Other: Trace fluid in left mastoid air cells. ASPECTS [facility] Stroke Program Early CT Score) - Ganglionic level infarction (caudate, lentiform nuclei, internal capsule, insula, M1-M3 cortex): 7 - Supraganglionic infarction (M4-M6 cortex): 3 Total score (0-10 with 10 being normal): 10 IMPRESSION: 1. No acute intracranial process. 2. ASPECTS is 10 Code stroke imaging results were communicated on 08/30/2021 at 7:49 pm to provider [facility] via secure text paging. Electronically Signed   By: Wiliam Ke M.D.   On: 08/30/2021 19:50      Assessment/Plan  Right sided headache/blurred vision/weakness -concerning  for recurrent CVA vs complex migraine -pt reports some improvement in symptoms following migraine cocktail but has persistent right blurred vision and right sided weakness -CT head negative -continue stroke up per neurology with MRI brain and MRA head and neck  -repeat Echo- has hx of small PFO with atrial fibrillation -Obtain hemoglobin A1c, lipid panel, TSH -Continue high intensity atorvastatin - Continue Eliquis - SBP goal less than 160 per neurology -Placed on continuous telemetry - Bedside swallow study - PT/OT/SLP consult  Paroxysmal atrial fibrillation - Patient initially presented with atrial fibrillation with RVR with rates up to 120 but converted spontaneously back to normal sinus rhythm - Continue Eliquis  History of PFO - Patient noted to have small patent foraminal ovale with her last stroke in 2021.  Has planned surgical repair but has been postponed due to recent iron deficiency anemia requiring transfusion  COPD -Not in acute exacerbation.  Requires 2 L of O2 at night at baseline  Iron deficiency anemia - Has history of needing multiple blood transfusions and IV iron.  Hemoglobin normal today at 14.3  Type 2 diabetes -Placed on low-dose sensitive sliding scale  HLD -continue hight intensity statin  DVT prophylaxis:Eliquis Code Status: Full Family Communication: Plan discussed with patient and husband at bedside  disposition Plan: Home with observation Consults called: Neurology Admission status: Observation  Level of care: Telemetry Cardiac  Status is: Observation  The patient remains OBS appropriate and will d/c before 2 midnights.        Anselm Jungling DO [facility]   If 7PM-7AM, please contact night-coverage [email or web address]   08/31/2021, 12:41 AM

## 2021-08-31 NOTE — ED Notes (Signed)
Per [employer] loop recorder interrogation, Pt has had 6 pt activated episodes with the most recent on 07/24/2021. Pt has had 562 tachy episodes with the most recent on 08/30/21. Pt has had 380 atrial arrhythmias with the most recent on 08/30/2021. Report is begin faxed shortly.

## 2021-08-31 NOTE — Discharge Summary (Signed)
Physician Discharge Summary  Kerri Steele YDX:[id number] DOB: Oct 29, 1961 DOA: 08/30/2021  PCP: Renee Rival, NP  Admit date: 08/30/2021 Discharge date: 08/31/2021  Admitted From: Home. Disposition:  Home  Recommendations for Outpatient Follow-up:  Follow up with PCP in 1-2 weeks Please obtain BMP/CBC in one week Please follow up with Neurology in one week and follow up   Discharge Condition:stable.  CODE STATUS:full code.  Diet recommendation: Heart Healthy    Brief/Interim Summary: Kerri Steele is a 60 y.o. female with medical history significant for CVA with PFO pending repair, paroxysmal atrial fibrillation on Eliquis, COPD with nocturnal hypoxia requiring 2 L, type 2 diabetes, iron deficiency anemia with history requiring IV iron and transfusion, hyperlipidemia and GERD who presents with concerns of severe headache, blurry vision and right-sided weakness.  Discharge Diagnoses:  Principal Problem:   Headache Active Problems:   PFO (patent foramen ovale)   Benign essential HTN   Type 2 diabetes mellitus with hyperlipidemia (HCC)   COPD without exacerbation (HCC)   Iron deficiency anemia due to chronic blood loss    Right sided headache/blurred vision/weakness -concerning for recurrent CVA vs complex migraine -pt reports some improvement in symptoms following migraine cocktail but has persistent right blurred vision and right sided weakness -CT head negative -continue stroke up per neurology with MRI brain and MRA head and neck which have been negative. . Echo reviewed with the patient.  -repeat Echo- has hx of small PFO with atrial fibrillation - hemoglobin A!C is 6.6 - LDL is 66 - ESR wnl and CRP is around 1.  -Continue high intensity atorvastatin - Continue Eliquis - neurology consulted and cleared her for discharge.    Paroxysmal atrial fibrillation - Patient initially presented with atrial fibrillation with RVR with rates up to 120 but converted  spontaneously back to normal sinus rhythm - Continue Eliquis   History of PFO - Patient noted to have small patent foraminal ovale with her last stroke in 2021.  Has planned surgical repair but has been postponed due to recent iron deficiency anemia requiring transfusion   COPD -Not in acute exacerbation.  Requires 2 L of O2 at night at baseline   Iron deficiency anemia - Has history of needing multiple blood transfusions and IV iron.  Hemoglobin normal today at 14.3   Type 2 diabetes Resume home meds.    HLD -continue hight intensity statin           Discharge Instructions  Discharge Instructions     Diet - low sodium heart healthy   Complete by: As directed    Discharge instructions   Complete by: As directed    Please follow up with Neurology and PCP and follow  up the CRP and ESR results.      Allergies as of 08/31/2021       Reactions   Metformin And Related Other (See Comments)   migraine   Morphine And Related Hives   Other Other (See Comments)   Latex allergy   Sulfa Antibiotics Itching        Medication List     TAKE these medications    atorvastatin 40 MG tablet Commonly known as: LIPITOR Take 1 tablet (40 mg total) by mouth daily.   Combivent Respimat 20-100 MCG/ACT Aers respimat Generic drug: Ipratropium-Albuterol Inhale 1 puff into the lungs 4 (four) times daily.   DULoxetine 60 MG capsule Commonly known as: CYMBALTA Take 60 mg by mouth daily.   DUPIXENT  Inject 1 Dose into  the skin every 14 (fourteen) days.   Eliquis 5 MG Tabs tablet Generic drug: apixaban Take 5 mg by mouth 2 (two) times daily.   glipiZIDE 10 MG 24 hr tablet Commonly known as: GLUCOTROL XL Take 10 mg by mouth daily with breakfast.   HYDROcodone-acetaminophen 5-325 MG tablet Commonly known as: Norco Take 1 tablet by mouth every 6 (six) hours as needed for severe pain.   magnesium oxide 400 MG tablet Commonly known as: MAG-OX Take 400 mg by mouth 2 (two)  times daily.   methocarbamol 750 MG tablet Commonly known as: ROBAXIN Take 750 mg by mouth 3 (three) times daily.   omeprazole 40 MG capsule Commonly known as: PRILOSEC Take 40 mg by mouth daily.   pregabalin 150 MG capsule Commonly known as: LYRICA Take 150 mg by mouth 3 (three) times daily.   traMADol 50 MG tablet Commonly known as: ULTRAM Take 50 mg by mouth 2 (two) times daily.   verapamil 120 MG CR tablet Commonly known as: CALAN-SR Take 120 mg by mouth 2 (two) times daily. Pt reports taking twice a day        Follow-up Information     Renee Rival, NP. Schedule an appointment as soon as possible for a visit in 1 week(s).   Specialty: Nurse Practitioner Contact information: [address] [place] [address] [phone]                Allergies  Allergen Reactions   Metformin And Related Other (See Comments)    migraine   Morphine And Related Hives   Other Other (See Comments)    Latex allergy   Sulfa Antibiotics Itching    Consultations:  Neurology.   Procedures/Studies: MR ANGIO HEAD WO CONTRAST  Result Date: 08/31/2021 CLINICAL DATA:  Stroke follow-up EXAM: MRI HEAD WITHOUT CONTRAST MRA HEAD WITHOUT CONTRAST MRA NECK WITHOUT CONTRAST TECHNIQUE: Multiplanar, multi-echo pulse sequences of the brain and surrounding structures were acquired without intravenous contrast. Angiographic images of the Circle of Willis were acquired using MRA technique without intravenous contrast. Angiographic images of the neck were acquired using MRA technique without intravenous contrast. Carotid stenosis measurements (when applicable) are obtained utilizing NASCET criteria, using the distal internal carotid diameter as the denominator. COMPARISON:  MRI head 10/31/2020 FINDINGS: MRI HEAD FINDINGS Brain: No acute infarction, hemorrhage, hydrocephalus, extra-axial collection or mass lesion. Vascular: Normal flow voids. Skull and upper cervical spine: Normal  marrow signal. Sinuses/Orbits: No acute or significant finding. Other: Fluid in the left-greater-than-right mastoid air cells. MRA HEAD FINDINGS Anterior circulation: Both internal carotid arteries are patent to the termini, with likely mild stenosis in the left distal cavernous carotid, although this may be artifactual. A1 segments patent. Normal anterior communicating artery. Anterior cerebral arteries are patent to their distal aspects. No M1 stenosis or occlusion. Normal MCA bifurcations. Distal MCA branches perfused and symmetric. Posterior circulation: Vertebral arteries widely patent to the vertebrobasilar junction without stenosis. Basilar patent to its distal aspect. Superior cerebral arteries patent bilaterally. PCAs well perfused to their distal aspects without stenosis. The left posterior communicating artery is not visualized. The right posterior communicating artery is patent. Anatomic variants: None significant. MRA NECK FINDINGS Standard aortic branching. The bilateral carotids and vertebral arteries are without stenosis (greater than 50%), dissection, or occlusion. IMPRESSION: 1. No acute intracranial process. 2. No hemodynamically significant stenosis in the neck. 3. No intracranial large vessel occlusion or significant stenosis. Electronically Signed   By: Merilyn Baba M.D.   On: 08/31/2021  03:02   MR ANGIO NECK WO CONTRAST  Result Date: 08/31/2021 CLINICAL DATA:  Stroke follow-up EXAM: MRI HEAD WITHOUT CONTRAST MRA HEAD WITHOUT CONTRAST MRA NECK WITHOUT CONTRAST TECHNIQUE: Multiplanar, multi-echo pulse sequences of the brain and surrounding structures were acquired without intravenous contrast. Angiographic images of the Circle of Willis were acquired using MRA technique without intravenous contrast. Angiographic images of the neck were acquired using MRA technique without intravenous contrast. Carotid stenosis measurements (when applicable) are obtained utilizing NASCET criteria, using the  distal internal carotid diameter as the denominator. COMPARISON:  MRI head 10/31/2020 FINDINGS: MRI HEAD FINDINGS Brain: No acute infarction, hemorrhage, hydrocephalus, extra-axial collection or mass lesion. Vascular: Normal flow voids. Skull and upper cervical spine: Normal marrow signal. Sinuses/Orbits: No acute or significant finding. Other: Fluid in the left-greater-than-right mastoid air cells. MRA HEAD FINDINGS Anterior circulation: Both internal carotid arteries are patent to the termini, with likely mild stenosis in the left distal cavernous carotid, although this may be artifactual. A1 segments patent. Normal anterior communicating artery. Anterior cerebral arteries are patent to their distal aspects. No M1 stenosis or occlusion. Normal MCA bifurcations. Distal MCA branches perfused and symmetric. Posterior circulation: Vertebral arteries widely patent to the vertebrobasilar junction without stenosis. Basilar patent to its distal aspect. Superior cerebral arteries patent bilaterally. PCAs well perfused to their distal aspects without stenosis. The left posterior communicating artery is not visualized. The right posterior communicating artery is patent. Anatomic variants: None significant. MRA NECK FINDINGS Standard aortic branching. The bilateral carotids and vertebral arteries are without stenosis (greater than 50%), dissection, or occlusion. IMPRESSION: 1. No acute intracranial process. 2. No hemodynamically significant stenosis in the neck. 3. No intracranial large vessel occlusion or significant stenosis. Electronically Signed   By: Merilyn Baba M.D.   On: 08/31/2021 03:02   MR BRAIN WO CONTRAST  Result Date: 08/31/2021 CLINICAL DATA:  Stroke follow-up EXAM: MRI HEAD WITHOUT CONTRAST MRA HEAD WITHOUT CONTRAST MRA NECK WITHOUT CONTRAST TECHNIQUE: Multiplanar, multi-echo pulse sequences of the brain and surrounding structures were acquired without intravenous contrast. Angiographic images of the  Circle of Willis were acquired using MRA technique without intravenous contrast. Angiographic images of the neck were acquired using MRA technique without intravenous contrast. Carotid stenosis measurements (when applicable) are obtained utilizing NASCET criteria, using the distal internal carotid diameter as the denominator. COMPARISON:  MRI head 10/31/2020 FINDINGS: MRI HEAD FINDINGS Brain: No acute infarction, hemorrhage, hydrocephalus, extra-axial collection or mass lesion. Vascular: Normal flow voids. Skull and upper cervical spine: Normal marrow signal. Sinuses/Orbits: No acute or significant finding. Other: Fluid in the left-greater-than-right mastoid air cells. MRA HEAD FINDINGS Anterior circulation: Both internal carotid arteries are patent to the termini, with likely mild stenosis in the left distal cavernous carotid, although this may be artifactual. A1 segments patent. Normal anterior communicating artery. Anterior cerebral arteries are patent to their distal aspects. No M1 stenosis or occlusion. Normal MCA bifurcations. Distal MCA branches perfused and symmetric. Posterior circulation: Vertebral arteries widely patent to the vertebrobasilar junction without stenosis. Basilar patent to its distal aspect. Superior cerebral arteries patent bilaterally. PCAs well perfused to their distal aspects without stenosis. The left posterior communicating artery is not visualized. The right posterior communicating artery is patent. Anatomic variants: None significant. MRA NECK FINDINGS Standard aortic branching. The bilateral carotids and vertebral arteries are without stenosis (greater than 50%), dissection, or occlusion. IMPRESSION: 1. No acute intracranial process. 2. No hemodynamically significant stenosis in the neck. 3. No intracranial large vessel occlusion or significant stenosis.  Electronically Signed   By: Merilyn Baba M.D.   On: 08/31/2021 03:02   ECHOCARDIOGRAM COMPLETE  Result Date: 08/31/2021     ECHOCARDIOGRAM REPORT   Patient Name:   AYLSSA HERRIG Date of Exam: 08/31/2021 Medical Rec #:  [id number]   Height:       60.0 in Accession #:    [id number]  Weight:       160.0 lb Date of Birth:  Apr 09, 1961  BSA:          1.698 m Patient Age:    5 years    BP:           115/67 mmHg Patient Gender: F           HR:           89 bpm. Exam Location:  Inpatient Procedure: 2D Echo, Cardiac Doppler and Color Doppler Indications:    Atrial fibrillation  History:        Patient has prior history of Echocardiogram examinations, most                 recent 06/01/2020. Stroke and COPD, Arrythmias:Atrial                 Fibrillation; Risk Factors:Diabetes and Dyslipidemia. PFO,                 anemia.  Sonographer:    Dustin Flock RDCS Referring Phys: [id number] [facility]  Sonographer Comments: Image acquisition challenging due to COPD. IMPRESSIONS  1. Left ventricular ejection fraction, by estimation, is 60 to 65%. The left ventricle has normal function. The left ventricle has no regional wall motion abnormalities. Left ventricular diastolic parameters are consistent with Grade I diastolic dysfunction (impaired relaxation). Elevated left atrial pressure.  2. Right ventricular systolic function is normal. The right ventricular size is normal.  3. The mitral valve is normal in structure. No evidence of mitral valve regurgitation. No evidence of mitral stenosis.  4. The aortic valve is tricuspid. Aortic valve regurgitation is trivial. No aortic stenosis is present.  5. The inferior vena cava is normal in size with greater than 50% respiratory variability, suggesting right atrial pressure of 3 mmHg. FINDINGS  Left Ventricle: Left ventricular ejection fraction, by estimation, is 60 to 65%. The left ventricle has normal function. The left ventricle has no regional wall motion abnormalities. The left ventricular internal cavity size was normal in size. There is  no left ventricular hypertrophy. Left ventricular diastolic parameters  are consistent with Grade I diastolic dysfunction (impaired relaxation). Elevated left atrial pressure. Right Ventricle: The right ventricular size is normal. Right ventricular systolic function is normal. Left Atrium: Left atrial size was normal in size. Right Atrium: Right atrial size was normal in size. Pericardium: There is no evidence of pericardial effusion. Mitral Valve: The mitral valve is normal in structure. No evidence of mitral valve regurgitation. No evidence of mitral valve stenosis. Tricuspid Valve: The tricuspid valve is normal in structure. Tricuspid valve regurgitation is trivial. No evidence of tricuspid stenosis. Aortic Valve: The aortic valve is tricuspid. Aortic valve regurgitation is trivial. Aortic regurgitation PHT measures 480 msec. No aortic stenosis is present. Pulmonic Valve: The pulmonic valve was not well visualized. Pulmonic valve regurgitation is not visualized. No evidence of pulmonic stenosis. Aorta: The aortic root is normal in size and structure. Venous: The inferior vena cava is normal in size with greater than 50% respiratory variability, suggesting right atrial pressure of 3 mmHg. IAS/Shunts: No atrial level  shunt detected by color flow Doppler.  LEFT VENTRICLE PLAX 2D LVIDd:         3.40 cm   Diastology LVIDs:         2.40 cm   LV e' medial:    5.00 cm/s LV PW:         1.10 cm   LV E/e' medial:  14.8 LV IVS:        1.00 cm   LV e' lateral:   7.18 cm/s LVOT diam:     2.20 cm   LV E/e' lateral: 10.3 LV SV:         73 LV SV Index:   43 LVOT Area:     3.80 cm  RIGHT VENTRICLE RV Basal diam:  2.10 cm RV S prime:     12.00 cm/s LEFT ATRIUM             Index        RIGHT ATRIUM          Index LA diam:        3.50 cm 2.06 cm/m   RA Area:     7.94 cm LA Vol (A2C):   27.4 ml 16.14 ml/m  RA Volume:   13.50 ml 7.95 ml/m LA Vol (A4C):   30.9 ml 18.20 ml/m LA Biplane Vol: 30.9 ml 18.20 ml/m  AORTIC VALVE LVOT Vmax:   86.80 cm/s LVOT Vmean:  55.100 cm/s LVOT VTI:    0.191 m AI PHT:       480 msec  AORTA Ao Root diam: 2.80 cm MITRAL VALVE MV Area (PHT): 5.23 cm    SHUNTS MV Decel Time: 145 msec    Systemic VTI:  0.19 m MV E velocity: 74.10 cm/s  Systemic Diam: 2.20 cm MV A velocity: 82.70 cm/s MV E/A ratio:  0.90 Kirk Ruths MD Electronically signed by Kirk Ruths MD Signature Date/Time: 08/31/2021/2:22:58 PM    Final    CT HEAD CODE STROKE WO CONTRAST  Result Date: 08/30/2021 CLINICAL DATA:  Code stroke.  Headache, visual changes EXAM: CT HEAD WITHOUT CONTRAST TECHNIQUE: Contiguous axial images were obtained from the base of the skull through the vertex without intravenous contrast. COMPARISON:  CT head 05/28/2020 FINDINGS: Brain: No evidence of acute infarction, hemorrhage, cerebral edema, mass, mass effect, or midline shift. Ventricles and sulci are normal for age. No extra-axial fluid collection. Vascular: No hyperdense vessel or unexpected calcification. Skull: Normal. Negative for fracture or focal lesion. Sinuses/Orbits: No acute finding. Other: Trace fluid in left mastoid air cells. ASPECTS [facility] Stroke Program Early CT Score) - Ganglionic level infarction (caudate, lentiform nuclei, internal capsule, insula, M1-M3 cortex): 7 - Supraganglionic infarction (M4-M6 cortex): 3 Total score (0-10 with 10 being normal): 10 IMPRESSION: 1. No acute intracranial process. 2. ASPECTS is 10 Code stroke imaging results were communicated on 08/30/2021 at 7:49 pm to provider [facility] via secure text paging. Electronically Signed   By: Merilyn Baba M.D.   On: 08/30/2021 19:50      Subjective: No new complaints.   Discharge Exam: Vitals:   08/31/21 1430 08/31/21 1515  BP: 130/64 (!) 122/59  Pulse: 85 89  Resp: 20 13  Temp:    SpO2: 99% 100%   Vitals:   08/31/21 1130 08/31/21 1430 08/31/21 1515 08/31/21 1600  BP: 115/67 130/64 (!) 122/59   Pulse: 99 85 89   Resp: (!) '28 20 13   ' Temp:      TempSrc:      SpO2:  100% 99% 100%   Weight:    72.6 kg  Height:    5' (1.524 m)     General: Pt is alert, awake, not in acute distress Cardiovascular: RRR, S1/S2 +, no rubs, no gallops Respiratory: CTA bilaterally, no wheezing, no rhonchi Abdominal: Soft, NT, ND, bowel sounds + Extremities: no edema, no cyanosis    The results of significant diagnostics from this hospitalization (including imaging, microbiology, ancillary and laboratory) are listed below for reference.     Microbiology: Recent Results (from the past 240 hour(s))  Resp Panel by RT-PCR (Flu A&B, Covid) Urine, Clean Catch     Status: None   Collection Time: 08/30/21  7:23 PM   Specimen: Urine, Clean Catch; Nasopharyngeal(NP) swabs in vial transport medium  Result Value Ref Range Status   SARS Coronavirus 2 by RT PCR NEGATIVE NEGATIVE Final    Comment: (NOTE) SARS-CoV-2 target nucleic acids are NOT DETECTED.  The SARS-CoV-2 RNA is generally detectable in upper respiratory specimens during the acute phase of infection. The lowest concentration of SARS-CoV-2 viral copies this assay can detect is 138 copies/mL. A negative result does not preclude SARS-Cov-2 infection and should not be used as the sole basis for treatment or other patient management decisions. A negative result may occur with  improper specimen collection/handling, submission of specimen other than nasopharyngeal swab, presence of viral mutation(s) within the areas targeted by this assay, and inadequate number of viral copies(<138 copies/mL). A negative result must be combined with clinical observations, patient history, and epidemiological information. The expected result is Negative.  Fact Sheet for Patients:  EntrepreneurPulse.com.au  Fact Sheet for Healthcare Providers:  IncredibleEmployment.be  This test is no t yet approved or cleared by the [place] FDA and  has been authorized for detection and/or diagnosis of SARS-CoV-2 by FDA under an Emergency Use Authorization (EUA). This  EUA will remain  in effect (meaning this test can be used) for the duration of the COVID-19 declaration under Section 564(b)(1) of the Act, 21 U.S.C.section 360bbb-3(b)(1), unless the authorization is terminated  or revoked sooner.       Influenza A by PCR NEGATIVE NEGATIVE Final   Influenza B by PCR NEGATIVE NEGATIVE Final    Comment: (NOTE) The Xpert Xpress SARS-CoV-2/FLU/RSV plus assay is intended as an aid in the diagnosis of influenza from Nasopharyngeal swab specimens and should not be used as a sole basis for treatment. Nasal washings and aspirates are unacceptable for Xpert Xpress SARS-CoV-2/FLU/RSV testing.  Fact Sheet for Patients: EntrepreneurPulse.com.au  Fact Sheet for Healthcare Providers: IncredibleEmployment.be  This test is not yet approved or cleared by the [place] FDA and has been authorized for detection and/or diagnosis of SARS-CoV-2 by FDA under an Emergency Use Authorization (EUA). This EUA will remain in effect (meaning this test can be used) for the duration of the COVID-19 declaration under Section 564(b)(1) of the Act, 21 U.S.C. section 360bbb-3(b)(1), unless the authorization is terminated or revoked.  Performed at [facility], [place] [address], [place], [place] [address]      Labs: BNP (last 3 results) Recent Labs    05/19/21 0217  BNP 10.0   Basic Metabolic Panel: Recent Labs  Lab 08/30/21 1930 08/30/21 1938 08/30/21 2018  NA 139 142  --   K 3.6 3.6  --   CL 102 102  --   CO2 28  --   --   GLUCOSE 157* 149*  --   BUN 11 11  --  CREATININE 0.92 0.90  --   CALCIUM 9.9  --   --   MG  --   --  2.2   Liver Function Tests: Recent Labs  Lab 08/30/21 1930  AST 29  ALT 33  ALKPHOS 89  BILITOT 0.6  PROT 6.9  ALBUMIN 4.0   No results for input(s): LIPASE, AMYLASE in the last 168 hours. No results for input(s): AMMONIA in the last 168 hours. CBC: Recent Labs  Lab 08/30/21 1930  08/30/21 1938  WBC 12.7*  --   NEUTROABS 7.8*  --   HGB 14.3 15.3*  HCT 44.5 45.0  MCV 82.0  --   PLT 348  --    Cardiac Enzymes: No results for input(s): CKTOTAL, CKMB, CKMBINDEX, TROPONINI in the last 168 hours. BNP: Invalid input(s): POCBNP CBG: Recent Labs  Lab 08/31/21 0814 08/31/21 1250 08/31/21 1725  GLUCAP 102* 100* 122*   D-Dimer No results for input(s): DDIMER in the last 72 hours. Hgb A1c Recent Labs    08/31/21 0305  HGBA1C 6.6*   Lipid Profile Recent Labs    08/31/21 0306  CHOL 124  HDL 36*  LDLCALC 66  TRIG 112  CHOLHDL 3.4   Thyroid function studies No results for input(s): TSH, T4TOTAL, T3FREE, THYROIDAB in the last 72 hours.  Invalid input(s): FREET3 Anemia work up No results for input(s): VITAMINB12, FOLATE, FERRITIN, TIBC, IRON, RETICCTPCT in the last 72 hours. Urinalysis    Component Value Date/Time   COLORURINE YELLOW 08/31/2021 1400   APPEARANCEUR CLOUDY (A) 08/31/2021 1400   LABSPEC 1.009 08/31/2021 1400   PHURINE 9.0 (H) 08/31/2021 1400   GLUCOSEU NEGATIVE 08/31/2021 1400   HGBUR NEGATIVE 08/31/2021 1400   BILIRUBINUR NEGATIVE 08/31/2021 1400   KETONESUR 5 (A) 08/31/2021 1400   PROTEINUR NEGATIVE 08/31/2021 1400   NITRITE NEGATIVE 08/31/2021 1400   LEUKOCYTESUR NEGATIVE 08/31/2021 1400   Sepsis Labs Invalid input(s): PROCALCITONIN,  WBC,  LACTICIDVEN Microbiology Recent Results (from the past 240 hour(s))  Resp Panel by RT-PCR (Flu A&B, Covid) Urine, Clean Catch     Status: None   Collection Time: 08/30/21  7:23 PM   Specimen: Urine, Clean Catch; Nasopharyngeal(NP) swabs in vial transport medium  Result Value Ref Range Status   SARS Coronavirus 2 by RT PCR NEGATIVE NEGATIVE Final    Comment: (NOTE) SARS-CoV-2 target nucleic acids are NOT DETECTED.  The SARS-CoV-2 RNA is generally detectable in upper respiratory specimens during the acute phase of infection. The lowest concentration of SARS-CoV-2 viral copies this assay  can detect is 138 copies/mL. A negative result does not preclude SARS-Cov-2 infection and should not be used as the sole basis for treatment or other patient management decisions. A negative result may occur with  improper specimen collection/handling, submission of specimen other than nasopharyngeal swab, presence of viral mutation(s) within the areas targeted by this assay, and inadequate number of viral copies(<138 copies/mL). A negative result must be combined with clinical observations, patient history, and epidemiological information. The expected result is Negative.  Fact Sheet for Patients:  EntrepreneurPulse.com.au  Fact Sheet for Healthcare Providers:  IncredibleEmployment.be  This test is no t yet approved or cleared by the [place] FDA and  has been authorized for detection and/or diagnosis of SARS-CoV-2 by FDA under an Emergency Use Authorization (EUA). This EUA will remain  in effect (meaning this test can be used) for the duration of the COVID-19 declaration under Section 564(b)(1) of the Act, 21 U.S.C.section 360bbb-3(b)(1), unless the authorization is terminated  or revoked sooner.       Influenza A by PCR NEGATIVE NEGATIVE Final   Influenza B by PCR NEGATIVE NEGATIVE Final    Comment: (NOTE) The Xpert Xpress SARS-CoV-2/FLU/RSV plus assay is intended as an aid in the diagnosis of influenza from Nasopharyngeal swab specimens and should not be used as a sole basis for treatment. Nasal washings and aspirates are unacceptable for Xpert Xpress SARS-CoV-2/FLU/RSV testing.  Fact Sheet for Patients: EntrepreneurPulse.com.au  Fact Sheet for Healthcare Providers: IncredibleEmployment.be  This test is not yet approved or cleared by the [place] FDA and has been authorized for detection and/or diagnosis of SARS-CoV-2 by FDA under an Emergency Use Authorization (EUA). This EUA will  remain in effect (meaning this test can be used) for the duration of the COVID-19 declaration under Section 564(b)(1) of the Act, 21 U.S.C. section 360bbb-3(b)(1), unless the authorization is terminated or revoked.  Performed at [facility], [place] [address] [place], [place] [address]      Time coordinating discharge: 33 minutes.   SIGNED:   Hosie Poisson, MD  [facility]

## 2021-08-31 NOTE — ED Notes (Signed)
Urine sample sent to lab. Pt given ice water.

## 2021-08-31 NOTE — ED Notes (Signed)
Pt was aware we needed a urine sample but went to the bathroom without giving one anyway's. This RN reminded pt that we needed a sample pt stated "what do you even need that for." This RN told the pt they wanted to do a drug screen and check a urinalysis to make sure there was not infection. Pt stated "yeah well I'd have kidney problems if I had a bladder infection so I know I don't." This RN will continue to monitor.

## 2021-08-31 NOTE — ED Notes (Signed)
Explained to pt the need of a urine sample, pt stated she just went to the bathroom and was shaking her head no. Told pt to let us know when she could provide a sample.

## 2021-08-31 NOTE — Progress Notes (Signed)
  Echocardiogram 2D Echocardiogram has been performed.  Kerri Steele 08/31/2021, 12:46 PM

## 2021-08-31 NOTE — Progress Notes (Signed)
Patient ready for discharge to home; discharge instructions given and reviewed; no new Rx.; patient discharged out via wheelchair accompanied home by her spouse.

## 2021-08-31 NOTE — Progress Notes (Addendum)
STROKE TEAM PROGRESS NOTE   ATTENDING NOTE: I reviewed above note and agree with the assessment and plan. Pt was seen and examined.   60 year old female with history of COPD, diabetes, hypertension, OSA, A. fib on Eliquis admitted for right temporal headache, right eye blurry vision, right arm numbness and weakness.  Patient stated that yesterday she had a bad headache at right temporal area with blurry vision on the right eye and she was scared and also failed right arm numbness and weakness.  CT no acute abnormality.  MRI, MRA head and neck unremarkable.  LDL 66, A1c 6.6.  EF 60 to 65%.  Creatinine 0.9, WBC 12.7, UDS negative.  Patient has a history of stroke in 05/2020 with difficulty walking and blurry vision and right-sided weakness.  Status post tPA.  CT unremarkable.  CT head and neck left P2 stenosis.  MRI showed left MCA/PCA infarct.  EF 50 to 55%, positive PFO.  A1c 5.9, LDL 86.  No DVT.  Loop recorder placed.  Found to have A. fib, currently on Eliquis.  On presentation, EKG continues show A. fib with RVR.  On my examination, patient heart rate normal range.  Patient stated compliant with Eliquis.  Given her complaining of right temporal headache and blurry vision on the right eye, had ESR and CRP tested, not consistent with temporal arteritis.  On exam, patient awake alert, lying in bed, husband at bedside.  Orientated x3, no aphasia, no dysarthria, follows simple commands.  No gaze palsy, visual field fall, no facial droop.  Right upper extremity mild giveaway weakness, bilateral lower extremity symmetrical.  Light touch on right upper extremity mildly decreased subjectively.  Finger-to-nose intact bilaterally.  Patient symptoms concerning for complicated migraine, exam not consistent with TIA and lab not consistent with temporal arteritis.  Recommend continue Eliquis and Lipitor for stroke prevention.  PT/OT no recommendation.  For detailed assessment and plan, please refer to above as I  have made changes wherever appropriate.   Neurology will sign off. Please call with questions. Pt will follow up with stroke clinic NP at [facility] in about 4 weeks. Thanks for the consult.   Rosalin Hawking, MD PhD Stroke Neurology 08/31/2021 8:00 PM    INTERVAL HISTORY Her husband is at the bedside at time of this exam. Neuroimaging is unremarkable for acute intracranial abnormalities. C/o bitempioral headaches. Concerning for temporal arteritis. Will do esr/crp.  Has giveaway weakness right arm.   Vitals:   08/31/21 0856 08/31/21 1100 08/31/21 1115 08/31/21 1130  BP: (!) 141/71 (!) 124/92 119/63 115/67  Pulse: 90 95 81 99  Resp: 18 (!) 25 19 (!) 28  Temp: 97.8 F (36.6 C)     TempSrc: Oral     SpO2: 98% 100% 98% 100%  Weight:      Height:       CBC:  Recent Labs  Lab 08/30/21 1930 08/30/21 1938  WBC 12.7*  --   NEUTROABS 7.8*  --   HGB 14.3 15.3*  HCT 44.5 45.0  MCV 82.0  --   PLT 348  --    Basic Metabolic Panel:  Recent Labs  Lab 08/30/21 1930 08/30/21 1938 08/30/21 2018  NA 139 142  --   K 3.6 3.6  --   CL 102 102  --   CO2 28  --   --   GLUCOSE 157* 149*  --   BUN 11 11  --   CREATININE 0.92 0.90  --   CALCIUM 9.9  --   --  MG  --   --  2.2    Lipid Panel:  Recent Labs  Lab 08/31/21 0306  CHOL 124  TRIG 112  HDL 36*  CHOLHDL 3.4  VLDL 22  LDLCALC 66    HgbA1c:  Recent Labs  Lab 08/31/21 0305  HGBA1C 6.6*   Urine Drug Screen: No results for input(s): LABOPIA, COCAINSCRNUR, LABBENZ, AMPHETMU, THCU, LABBARB in the last 168 hours.  Alcohol Level  Recent Labs  Lab 08/30/21 1930  ETH <10    IMAGING past 24 hours MR ANGIO HEAD WO CONTRAST  Result Date: 08/31/2021 CLINICAL DATA:  Stroke follow-up EXAM: MRI HEAD WITHOUT CONTRAST MRA HEAD WITHOUT CONTRAST MRA NECK WITHOUT CONTRAST TECHNIQUE: Multiplanar, multi-echo pulse sequences of the brain and surrounding structures were acquired without intravenous contrast. Angiographic images of the  Circle of Willis were acquired using MRA technique without intravenous contrast. Angiographic images of the neck were acquired using MRA technique without intravenous contrast. Carotid stenosis measurements (when applicable) are obtained utilizing NASCET criteria, using the distal internal carotid diameter as the denominator. COMPARISON:  MRI head 10/31/2020 FINDINGS: MRI HEAD FINDINGS Brain: No acute infarction, hemorrhage, hydrocephalus, extra-axial collection or mass lesion. Vascular: Normal flow voids. Skull and upper cervical spine: Normal marrow signal. Sinuses/Orbits: No acute or significant finding. Other: Fluid in the left-greater-than-right mastoid air cells. MRA HEAD FINDINGS Anterior circulation: Both internal carotid arteries are patent to the termini, with likely mild stenosis in the left distal cavernous carotid, although this may be artifactual. A1 segments patent. Normal anterior communicating artery. Anterior cerebral arteries are patent to their distal aspects. No M1 stenosis or occlusion. Normal MCA bifurcations. Distal MCA branches perfused and symmetric. Posterior circulation: Vertebral arteries widely patent to the vertebrobasilar junction without stenosis. Basilar patent to its distal aspect. Superior cerebral arteries patent bilaterally. PCAs well perfused to their distal aspects without stenosis. The left posterior communicating artery is not visualized. The right posterior communicating artery is patent. Anatomic variants: None significant. MRA NECK FINDINGS Standard aortic branching. The bilateral carotids and vertebral arteries are without stenosis (greater than 50%), dissection, or occlusion. IMPRESSION: 1. No acute intracranial process. 2. No hemodynamically significant stenosis in the neck. 3. No intracranial large vessel occlusion or significant stenosis. Electronically Signed   By: Merilyn Baba M.D.   On: 08/31/2021 03:02   MR ANGIO NECK WO CONTRAST  Result Date:  08/31/2021 CLINICAL DATA:  Stroke follow-up EXAM: MRI HEAD WITHOUT CONTRAST MRA HEAD WITHOUT CONTRAST MRA NECK WITHOUT CONTRAST TECHNIQUE: Multiplanar, multi-echo pulse sequences of the brain and surrounding structures were acquired without intravenous contrast. Angiographic images of the Circle of Willis were acquired using MRA technique without intravenous contrast. Angiographic images of the neck were acquired using MRA technique without intravenous contrast. Carotid stenosis measurements (when applicable) are obtained utilizing NASCET criteria, using the distal internal carotid diameter as the denominator. COMPARISON:  MRI head 10/31/2020 FINDINGS: MRI HEAD FINDINGS Brain: No acute infarction, hemorrhage, hydrocephalus, extra-axial collection or mass lesion. Vascular: Normal flow voids. Skull and upper cervical spine: Normal marrow signal. Sinuses/Orbits: No acute or significant finding. Other: Fluid in the left-greater-than-right mastoid air cells. MRA HEAD FINDINGS Anterior circulation: Both internal carotid arteries are patent to the termini, with likely mild stenosis in the left distal cavernous carotid, although this may be artifactual. A1 segments patent. Normal anterior communicating artery. Anterior cerebral arteries are patent to their distal aspects. No M1 stenosis or occlusion. Normal MCA bifurcations. Distal MCA branches perfused and symmetric. Posterior circulation: Vertebral arteries widely  patent to the vertebrobasilar junction without stenosis. Basilar patent to its distal aspect. Superior cerebral arteries patent bilaterally. PCAs well perfused to their distal aspects without stenosis. The left posterior communicating artery is not visualized. The right posterior communicating artery is patent. Anatomic variants: None significant. MRA NECK FINDINGS Standard aortic branching. The bilateral carotids and vertebral arteries are without stenosis (greater than 50%), dissection, or occlusion.  IMPRESSION: 1. No acute intracranial process. 2. No hemodynamically significant stenosis in the neck. 3. No intracranial large vessel occlusion or significant stenosis. Electronically Signed   By: Merilyn Baba M.D.   On: 08/31/2021 03:02   MR BRAIN WO CONTRAST  Result Date: 08/31/2021 CLINICAL DATA:  Stroke follow-up EXAM: MRI HEAD WITHOUT CONTRAST MRA HEAD WITHOUT CONTRAST MRA NECK WITHOUT CONTRAST TECHNIQUE: Multiplanar, multi-echo pulse sequences of the brain and surrounding structures were acquired without intravenous contrast. Angiographic images of the Circle of Willis were acquired using MRA technique without intravenous contrast. Angiographic images of the neck were acquired using MRA technique without intravenous contrast. Carotid stenosis measurements (when applicable) are obtained utilizing NASCET criteria, using the distal internal carotid diameter as the denominator. COMPARISON:  MRI head 10/31/2020 FINDINGS: MRI HEAD FINDINGS Brain: No acute infarction, hemorrhage, hydrocephalus, extra-axial collection or mass lesion. Vascular: Normal flow voids. Skull and upper cervical spine: Normal marrow signal. Sinuses/Orbits: No acute or significant finding. Other: Fluid in the left-greater-than-right mastoid air cells. MRA HEAD FINDINGS Anterior circulation: Both internal carotid arteries are patent to the termini, with likely mild stenosis in the left distal cavernous carotid, although this may be artifactual. A1 segments patent. Normal anterior communicating artery. Anterior cerebral arteries are patent to their distal aspects. No M1 stenosis or occlusion. Normal MCA bifurcations. Distal MCA branches perfused and symmetric. Posterior circulation: Vertebral arteries widely patent to the vertebrobasilar junction without stenosis. Basilar patent to its distal aspect. Superior cerebral arteries patent bilaterally. PCAs well perfused to their distal aspects without stenosis. The left posterior communicating  artery is not visualized. The right posterior communicating artery is patent. Anatomic variants: None significant. MRA NECK FINDINGS Standard aortic branching. The bilateral carotids and vertebral arteries are without stenosis (greater than 50%), dissection, or occlusion. IMPRESSION: 1. No acute intracranial process. 2. No hemodynamically significant stenosis in the neck. 3. No intracranial large vessel occlusion or significant stenosis. Electronically Signed   By: Merilyn Baba M.D.   On: 08/31/2021 03:02   CT HEAD CODE STROKE WO CONTRAST  Result Date: 08/30/2021 CLINICAL DATA:  Code stroke.  Headache, visual changes EXAM: CT HEAD WITHOUT CONTRAST TECHNIQUE: Contiguous axial images were obtained from the base of the skull through the vertex without intravenous contrast. COMPARISON:  CT head 05/28/2020 FINDINGS: Brain: No evidence of acute infarction, hemorrhage, cerebral edema, mass, mass effect, or midline shift. Ventricles and sulci are normal for age. No extra-axial fluid collection. Vascular: No hyperdense vessel or unexpected calcification. Skull: Normal. Negative for fracture or focal lesion. Sinuses/Orbits: No acute finding. Other: Trace fluid in left mastoid air cells. ASPECTS [facility] Stroke Program Early CT Score) - Ganglionic level infarction (caudate, lentiform nuclei, internal capsule, insula, M1-M3 cortex): 7 - Supraganglionic infarction (M4-M6 cortex): 3 Total score (0-10 with 10 being normal): 10 IMPRESSION: 1. No acute intracranial process. 2. ASPECTS is 10 Code stroke imaging results were communicated on 08/30/2021 at 7:49 pm to provider [facility] via secure text paging. Electronically Signed   By: Merilyn Baba M.D.   On: 08/30/2021 19:50    PHYSICAL EXAM   Physical Exam  Constitutional: Appears well-developed and well-nourished. Husband at bedside providing support.  Psych: Affect appropriate to situation Eyes: No scleral injection HENT: No OP obstruction. Head: Normocephalic.   Cardiovascular: Normal rate and regular rhythm.  Respiratory: Effort normal, symmetric excursions bilaterally, no audible wheezing. GI: Soft.  No distension. There is no tenderness.  Skin: WDI   Neuro: Mental Status: Patient is awake, alert, oriented to person, place, month, year, and situation. Patient is able to give a clear and coherent history. Speech fluent, intact comprehension and repetition. No signs of aphasia or neglect. Visual Fields are full. Pupils are equal, round, and reactive to light. EOMI without ptosis or diploplia.  Facial sensation is symmetric to temperature Facial movement is symmetric.  Hearing is intact to voice. Uvula midline and palate elevates symmetrically. Shoulder shrug is symmetric. Tongue is midline without atrophy or fasciculations.  Tone is increased in lower extremities. Bulk is normal.  Right upper extremity is 3/5, question giveaway weakness.  Sensation is symmetric to light touch and temperature in the arms and legs. Deep Tendon Reflexes: 2+ and symmetric in the biceps.  Lower extremities spastic reflexes 3+ with cross adduction bilaterally. Toes are downgoing bilaterally. FNF and HKS are intact bilaterally. Gait is slow but no antalgic nor does she favor one side.   ASSESSMENT/PLAN Ms. Deleah Tison is a 60 y.o. female with history of  Baljit Liebert is a 60 y.o. female with medical history significant for CVA with PFO pending repair, paroxysmal atrial fibrillation on Eliquis, COPD with nocturnal hypoxia requiring 2 L, type 2 diabetes, iron deficiency anemia with history requiring IV iron and transfusion, hyperlipidemia and GERD who presents with concerns of severe headache, blurry vision and right-sided weakness.   Patient reports that around 1650 she noticed sudden acute onset of right-sided headache with right blurry vision.  Also notes right upper and lower extremity weakness.  Patient states symptoms feel similar to her previous stroke where she  had a generalized headache with right-sided weakness and blurry vision as well.  Husband at bedside reports that she had unsteady gait and had some intermittent confusion about where she was throughout today due to the headache.  Patient is on Eliquis for atrial fibrillation and reports compliance.   Her last stroke was in 05/2020 where she was found to have acute left occipital CVA and received tPA.  No large vessel occlusion was seen on CTA head.  She had echocardiogram with small patent foramina ovale and had placement of loop recorder. Her PFO repair is postponed due to iron deficiency anemia back in July requiring multiple blood transfusions and repeated IV iron infusions.    TIA:     MRI BRAIN/ MRA HEAD AND NECK:     1. No acute intracranial process. 2. No hemodynamically significant stenosis in the neck. 3. No intracranial large vessel occlusion or significant stenosis.   2D Echo done and results are pending    LDL 66 HgbA1c 6.6 VTE prophylaxis - scd     Diet   Diet Heart Room service appropriate? Yes; Fluid consistency: Thin   Eliquis (apixaban) daily and No antithrombotic prior to admission, now on Eliquis (apixaban) daily.   Therapy recommendations:  pending Disposition:  home  Hypertension Home meds:  verapimil Stable Permissive hypertension (OK if < 220/120) but gradually normalize in 5-7 days Long-term BP goal normotensive  Hyperlipidemia LDL 66, goal < 70  Diabetes type II Controlled Home meds:  glipizide  HgbA1c 6.6, goal < 7.0 CBGs Recent Labs  08/31/21 0814 08/31/21 1250  GLUCAP 102* 100*    SSI  Other Stroke Risk Factors    Obesity, Body mass index is 31.25 kg/m., BMI >/= 30 associated with increased stroke risk, recommend weight loss, diet and exercise as appropriate   Hx stroke/TIA   Other Active Problems  Paroxysmal atrial fibrillation - Patient initially presented with atrial fibrillation with RVR with rates up to 120 but converted  spontaneously back to normal sinus rhythm - Continue Eliquis   History of PFO - Patient noted to have small patent foraminal ovale with her last stroke in 2021.  Has planned surgical repair but has been postponed due to recent iron deficiency anemia requiring transfusion   COPD -Not in acute exacerbation.  Requires 2 L of O2 at night at baseline   Iron deficiency anemia - Has history of needing multiple blood transfusions and IV iron.  Hemoglobin normal today at 14.3   Type 2 diabetes -Placed on low-dose sensitive sliding scale   HLD -continue hight intensity statin    Hospital day # 0     To contact Stroke Continuity provider, please refer to [email or web address] After hours, contact General Neurology

## 2021-08-31 NOTE — Progress Notes (Signed)
PT Evaluation   08/31/21 1337  PT Visit Information  Last PT Received On 08/31/21  Assistance Needed +1  History of Present Illness 60 yo female presenting to ED with severe headache, blurry vision, and R-sided weakness. MRI negative for acute change. PMH including CVA (L occipital lobe) with PFO pending repair, paroxysmal atrial fibrillation on Eliquis, COPD with nocturnal hypoxia requiring 2 L, type 2 diabetes, iron deficiency anemia with history requiring IV iron and transfusion, hyperlipidemia and GERD.  Precautions  Precautions Fall  Restrictions  Weight Bearing Restrictions No  Home Living  Family/patient expects to be discharged to: Private residence  Living Arrangements Spouse/significant other  Available Help at Discharge Family  Type of Home House  Home Access Stairs to enter  Entrance Stairs-Number of Steps 2  Entrance Stairs-Rails None  Home Layout One level  Bathroom Shower/Tub Tub/shower unit  Tour manager None  Prior Function  Level of Independence Independent  Comments Normally does not drive very much. Husband assists with cleaning.  Communication  Communication No difficulties  Pain Assessment  Pain Assessment Faces  Faces Pain Scale 6  Pain Location headache  Pain Descriptors / Indicators Headache  Pain Intervention(s) Limited activity within patient's tolerance;Monitored during session;Repositioned  Cognition  Arousal/Alertness Awake/alert  Behavior During Therapy WFL for tasks assessed/performed  Overall Cognitive Status Within Functional Limits for tasks assessed  General Comments Pt requiring increased time for processing. Feel she is close to baseline for cognition. Able to follow commands and discuss dc recommendations  Upper Extremity Assessment  Upper Extremity Assessment Defer to OT evaluation  Lower Extremity Assessment  Lower Extremity Assessment Generalized weakness  Cervical / Trunk Assessment  Cervical / Trunk  Assessment Normal  Bed Mobility  Overal bed mobility Needs Assistance  Bed Mobility Supine to Sit;Sit to Supine  Supine to sit Min assist  Sit to supine Min guard  General bed mobility comments Min A for trunk elevation to come to sitting. Increased time and effort required.  Transfers  Overall transfer level Needs assistance  Equipment used 1 person hand held assist;None  Transfers Sit to/from Stand  Sit to Stand Min guard;Min assist  General transfer comment Initial Min A for gaining balance. Min guard for safety for transfer from other surfaces  Ambulation/Gait  Ambulation/Gait assistance Min guard;Min assist  Gait Distance (Feet) 100 Feet  Assistive device 1 person hand held assist;None  Gait Pattern/deviations Step-through pattern;Decreased stride length;Shuffle  General Gait Details Very slow, very guarded gait. Min A initially, however, able to progress to min guard. Pt holding to PT hand for support. When asked about using RW, pt refusing to use.  Gait velocity Decreased  Balance  Overall balance assessment Needs assistance  Sitting-balance support No upper extremity supported;Feet supported  Sitting balance-Leahy Scale Fair  Standing balance support No upper extremity supported;Single extremity supported  Standing balance-Leahy Scale Fair  General Comments  General comments (skin integrity, edema, etc.) Husband present throughout (deaf). Reports R eye blurriness  PT - End of Session  Equipment Utilized During Treatment Gait belt  Activity Tolerance Patient tolerated treatment well  Patient left in bed;with call bell/phone within reach (on stretcher in ED)  Nurse Communication Mobility status  PT Assessment  PT Recommendation/Assessment Patient needs continued PT services  PT Visit Diagnosis Unsteadiness on feet (R26.81);Muscle weakness (generalized) (M62.81)  PT Problem List Decreased strength;Decreased balance;Decreased mobility;Decreased activity tolerance;Decreased  safety awareness;Decreased knowledge of precautions;Cardiopulmonary status limiting activity  PT Plan  PT Frequency (ACUTE ONLY)  Min 3X/week  PT Treatment/Interventions (ACUTE ONLY) DME instruction;Gait training;Stair training;Functional mobility training;Therapeutic activities;Therapeutic exercise;Balance training;Patient/family education  AM-PAC PT "6 Clicks" Mobility Outcome Measure (Version 2)  Help needed turning from your back to your side while in a flat bed without using bedrails? 3  Help needed moving from lying on your back to sitting on the side of a flat bed without using bedrails? 3  Help needed moving to and from a bed to a chair (including a wheelchair)? 3  Help needed standing up from a chair using your arms (e.g., wheelchair or bedside chair)? 3  Help needed to walk in hospital room? 3  Help needed climbing 3-5 steps with a railing?  2  6 Click Score 17  Consider Recommendation of Discharge To: Home with [facility]  Progressive Mobility  What is the highest level of mobility based on the progressive mobility assessment? Level 5 (Walks with assist in room/hall) - Balance while stepping forward/back and can walk in room with assist - Complete  Mobility Ambulated with assistance in hallway  PT Recommendation  Follow Up Recommendations No PT follow up;Supervision for mobility/OOB (would benefit from follow up with eye doc)  PT equipment None recommended by PT (Pt refusing RW, although she would benefit)  Individuals Consulted  Consulted and Agree with Results and Recommendations Patient  Acute Rehab PT Goals  Patient Stated Goal Go home  PT Goal Formulation With patient  Time For Goal Achievement 09/14/21  Potential to Achieve Goals Fair  PT Time Calculation  PT Start Time (ACUTE ONLY) 0909  PT Stop Time (ACUTE ONLY) 0933  PT Time Calculation (min) (ACUTE ONLY) 24 min  PT General Charges  $$ ACUTE PT VISIT 1 Visit  PT Evaluation  $PT Eval Low Complexity 1 Low  Written  Expression  Dominant Hand Right   Pt admitted secondary to problem above with deficits above. Pt very guarded during gait and holding to PT hand for support. Required min A initially, but then progressed to min guard A. Educated about use of RW, but pt refusing use at home reporting she could use her husband. Anticipate pt will progress well with mobility tasks and will not require follow up PT. Would benefit from follow up with eye doctor. Will continue to follow acutely.   Farley Ly, PT, DPT  Acute Rehabilitation Services  Pager: [phone] Office: [phone]

## 2021-08-31 NOTE — TOC Initial Note (Signed)
Transition of Care [facility]) - Initial/Assessment Note    Patient Details  Name: Kerri Steele MRN: [id number] Date of Birth: 1961/04/26  [facility]) CM/SW Contact:    Kermit Balo, RN Phone Number: 08/31/2021, 4:32 PM  Clinical Narrative:                 Patient is from home with spouse. She denies any home DME or issues with home medications. Spouse or pt are able to provide needed transportation.  No f/u per PT and pt refusing rolling walker.  Pt has transport home when medically ready.   Expected Discharge Plan: Home/Self Care Barriers to Discharge: Continued Medical Work up   Patient Goals and CMS Choice        Expected Discharge Plan and Services Expected Discharge Plan: Home/Self Care   Discharge Planning Services: CM Consult   Living arrangements for the past 2 months: Single Family Home                                      Prior Living Arrangements/Services Living arrangements for the past 2 months: Single Family Home Lives with:: Spouse Patient language and need for interpreter reviewed:: Yes Do you feel safe going back to the place where you live?: Yes        Care giver support system in place?: Yes (comment)   Criminal Activity/Legal Involvement Pertinent to Current Situation/Hospitalization: No - Comment as needed  Activities of Daily Living Home Assistive Devices/Equipment: None ADL Screening (condition at time of admission) Patient's cognitive ability adequate to safely complete daily activities?: Yes Is the patient deaf or have difficulty hearing?: No Does the patient have difficulty seeing, even when wearing glasses/contacts?: No Does the patient have difficulty concentrating, remembering, or making decisions?: No Patient able to express need for assistance with ADLs?: Yes Does the patient have difficulty dressing or bathing?: No Independently performs ADLs?: Yes (appropriate for developmental age) Does the patient have  difficulty walking or climbing stairs?: No Weakness of Legs: None Weakness of Arms/Hands: None  Permission Sought/Granted                  Emotional Assessment Appearance:: Appears stated age Attitude/Demeanor/Rapport: Engaged Affect (typically observed): Accepting Orientation: : Oriented to Self, Oriented to Place, Oriented to  Time, Oriented to Situation   Psych Involvement: No (comment)  Admission diagnosis:  Headache [R51.9] Acute nonintractable headache, unspecified headache type [R51.9] Patient Active Problem List   Diagnosis Date Noted   Headache 08/30/2021   Symptomatic anemia 05/19/2021   COPD exacerbation (HCC) 05/19/2021   Iron deficiency anemia due to chronic blood loss    PFO (patent foramen ovale) 06/01/2020   Benign essential HTN 06/01/2020   Type 2 diabetes mellitus with hyperlipidemia (HCC) 06/01/2020   COPD without exacerbation (HCC) 06/01/2020   GERD (gastroesophageal reflux disease) 06/01/2020   HLD (hyperlipidemia) 06/01/2020   Chronic pain disorder 06/01/2020   Stroke (HCC) 05/28/2020   Acute colitis 07/30/2016   Breath shortness 10/18/2012   PCP:  Erasmo Downer, NP Pharmacy:   [facility] - [place], [place] - [address] [place] [address] Phone: [phone] Fax: [phone]     Social Determinants of Health (SDOH) Interventions    Readmission Risk Interventions No flowsheet data found.

## 2021-08-31 NOTE — Care Management Obs Status (Signed)
MEDICARE OBSERVATION STATUS NOTIFICATION   Patient Details  Name: Kerri Steele MRN: [id number] Date of Birth: 1961-08-24   Medicare Observation Status Notification Given:  Yes    Kermit Balo, RN 08/31/2021, 4:17 PM

## 2021-08-31 NOTE — ED Notes (Signed)
Admitting paged to RN per her request 

## 2021-08-31 NOTE — Evaluation (Signed)
Occupational Therapy Evaluation Patient Details Name: Kerri Steele MRN: [id number] DOB: 12/19/1960 Today's Date: 08/31/2021   History of Present Illness 60 yo female presenting to ED with severe headache, blurry vision, and R-sided weakness. MRI negative for acute change. PMH including CVA (L occipital lobe) with PFO pending repair, paroxysmal atrial fibrillation on Eliquis, COPD with nocturnal hypoxia requiring 2 L, type 2 diabetes, iron deficiency anemia with history requiring IV iron and transfusion, hyperlipidemia and GERD.   Clinical Impression   PTA, pt was living with her husband and was performing ADLs and light IADLs; does not drive. Pt currently requiring Min Guard A for ADLs and functional mobility. Presenting with decreased balance, strength at RUE, and vision. Reporting blurry vision at R eye. Recommending pt follow up with eye doctor for vision assessment; pt verbalized understanding. Recommend dc to home once medically stable per physician. Will continue to follow acutely as admitted.      Recommendations for follow up therapy are one component of a multi-disciplinary discharge planning process, led by the attending physician.  Recommendations may be updated based on patient status, additional functional criteria and insurance authorization.   Follow Up Recommendations  Other (comment) (Follow up with eye doctor)    Equipment Recommendations  None recommended by OT    Recommendations for Other Services PT consult     Precautions / Restrictions Precautions Precautions: Fall Restrictions Weight Bearing Restrictions: No      Mobility Bed Mobility Overal bed mobility: Needs Assistance Bed Mobility: Supine to Sit;Sit to Supine     Supine to sit: Min guard Sit to supine: Min guard   General bed mobility comments: safety. requiring increased time and effort    Transfers Overall transfer level: Needs assistance Equipment used: 1 person hand held  assist;None Transfers: Sit to/from Stand Sit to Stand: Min guard;Min assist         General transfer comment: Initial Min A for gaining balance. Min guard A for toilet    Balance                                           ADL either performed or assessed with clinical judgement   ADL Overall ADL's : Needs assistance/impaired Eating/Feeding: Set up;Sitting   Grooming: Wash/dry hands;Supervision/safety;Standing   Upper Body Bathing: Set up;Supervision/ safety;Sitting   Lower Body Bathing: Min guard;Sit to/from stand   Upper Body Dressing : Set up;Supervision/safety;Sitting   Lower Body Dressing: Min guard;Sit to/from stand   Toilet Transfer: Min guard;Ambulation;Regular Toilet   Toileting- Architect and Hygiene: Min guard;Sitting/lateral lean       Functional mobility during ADLs: Min guard General ADL Comments: Pt demonstrating decreased strength at RUE, balance, and vision     Vision Patient Visual Report: Other (comment) (R sided blurry vision) Vision Assessment?: Yes Eye Alignment: Within Functional Limits Ocular Range of Motion: Within Functional Limits Alignment/Gaze Preference: Within Defined Limits Tracking/Visual Pursuits: Decreased smoothness of eye movement to RIGHT inferior field;Decreased smoothness of eye movement to LEFT inferior field Convergence: Within functional limits Visual Fields: Right visual field deficit (blurry vision on R) Additional Comments: Denies diplopia. Noting decreased smooth tracking. also noting pt decreased peripheral vision to R; reporting blurry vision at R eye. When covering L eye, r blurry. when covering R, no blurry at L     Perception     Praxis      Pertinent  Vitals/Pain Pain Assessment: Faces Faces Pain Scale: Hurts even more Pain Location: headache Pain Descriptors / Indicators: Headache Pain Intervention(s): Monitored during session;Limited activity within patient's  tolerance;Repositioned     Hand Dominance Right   Extremity/Trunk Assessment Upper Extremity Assessment Upper Extremity Assessment: RUE deficits/detail RUE Deficits / Details: Weakness gross and with grasp. Slow with opposition RUE Coordination: decreased fine motor;decreased gross motor   Lower Extremity Assessment Lower Extremity Assessment: Defer to PT evaluation   Cervical / Trunk Assessment Cervical / Trunk Assessment: Normal   Communication Communication Communication: No difficulties   Cognition Arousal/Alertness: Awake/alert Behavior During Therapy: WFL for tasks assessed/performed Overall Cognitive Status: Within Functional Limits for tasks assessed                                 General Comments: Pt requiring increased time for processing. Feel she is close to baseline for cognition. Able to follow commands and discuss dc recommendations   General Comments  Husband present throughout (deaf)    Exercises     Shoulder Instructions      Home Living Family/patient expects to be discharged to:: Private residence Living Arrangements: Spouse/significant other Available Help at Discharge: Family Type of Home: House Home Access: Stairs to enter Secretary/administrator of Steps: 2 Entrance Stairs-Rails: None Home Layout: One level     Bathroom Shower/Tub: Chief Strategy Officer: Standard     Home Equipment: None          Prior Functioning/Environment Level of Independence: Independent        Comments: Normally does not drive very much. Husband assists with cleaning.        OT Problem List: Decreased strength;Decreased range of motion;Decreased activity tolerance;Decreased cognition;Decreased safety awareness;Decreased knowledge of use of DME or AE      OT Treatment/Interventions: Self-care/ADL training;Therapeutic exercise;Energy conservation;DME and/or AE instruction;Therapeutic activities;Patient/family education    OT  Goals(Current goals can be found in the care plan section) Acute Rehab OT Goals Patient Stated Goal: Go home OT Goal Formulation: With patient/family Time For Goal Achievement: 09/14/21 Potential to Achieve Goals: Good  OT Frequency: Min 2X/week   Barriers to D/C:            Co-evaluation              AM-PAC OT "6 Clicks" Daily Activity     Outcome Measure Help from another person eating meals?: None Help from another person taking care of personal grooming?: A Little Help from another person toileting, which includes using toliet, bedpan, or urinal?: A Little Help from another person bathing (including washing, rinsing, drying)?: A Little Help from another person to put on and taking off regular upper body clothing?: A Little Help from another person to put on and taking off regular lower body clothing?: A Little 6 Click Score: 19   End of Session Equipment Utilized During Treatment: Gait belt Nurse Communication: Mobility status  Activity Tolerance: Patient tolerated treatment well Patient left: in bed;with call bell/phone within reach;with family/visitor present  OT Visit Diagnosis: Unsteadiness on feet (R26.81);Other abnormalities of gait and mobility (R26.89);Muscle weakness (generalized) (M62.81);Hemiplegia and hemiparesis Hemiplegia - Right/Left: Right Hemiplegia - dominant/non-dominant: Dominant                Time: 4696-2952 OT Time Calculation (min): 23 min Charges:  OT General Charges $OT Visit: 1 Visit OT Evaluation $OT Eval Low Complexity: 1 Low  Kyah Buesing  MSOT, OTR/L Acute Rehab Pager: [phone] Office: [phone]  Theodoro Grist Cathryn Gallery 08/31/2021, 11:06 AM

## 2021-10-02 ENCOUNTER — Ambulatory Visit (INDEPENDENT_AMBULATORY_CARE_PROVIDER_SITE_OTHER): Payer: Medicare Other | Admitting: Gastroenterology

## 2021-10-02 ENCOUNTER — Other Ambulatory Visit: Payer: Self-pay

## 2021-10-02 ENCOUNTER — Encounter: Payer: Self-pay | Admitting: Gastroenterology

## 2021-10-02 VITALS — BP 134/74 | HR 78 | Ht 60.0 in | Wt 159.8 lb

## 2021-10-02 DIAGNOSIS — K219 Gastro-esophageal reflux disease without esophagitis: Secondary | ICD-10-CM | POA: Diagnosis not present

## 2021-10-02 DIAGNOSIS — D500 Iron deficiency anemia secondary to blood loss (chronic): Secondary | ICD-10-CM

## 2021-10-02 DIAGNOSIS — R9389 Abnormal findings on diagnostic imaging of other specified body structures: Secondary | ICD-10-CM | POA: Insufficient documentation

## 2021-10-02 DIAGNOSIS — Z862 Personal history of diseases of the blood and blood-forming organs and certain disorders involving the immune mechanism: Secondary | ICD-10-CM | POA: Insufficient documentation

## 2021-10-02 NOTE — Progress Notes (Signed)
Primary Care Physician: Erasmo Downer, NP  Primary Gastroenterologist:  Dr. [name]  Chief Complaint  Patient presents with   Hospitalization Follow-up     HPI: Kerri Steele is a 60 y.o. female here for follow-up after being in the hospital recently with mental status changes and prior to that was seen by hematology that requested the patient follow-up with me for capsule endoscopy.  The patient had an EGD and colonoscopy without any source of the patient's iron deficiency anemia being seen.  The patient's hemoglobin had hematocrit have been normal recently.  The patient did not undergo a capsule endoscopy after the last procedures looking for the source of the anemia. The patient reports that her heartburn is under control at the present time.  She has no complaints and reports that her hemoglobin has been stable.  She has been told that for completeness and due to her on Levsin as she should undergo the capsule endoscopy to rule out any small bowel pathology.  Past Medical History:  Diagnosis Date   Anginal pain (HCC)    Anxiety    Asthma    COPD (chronic obstructive pulmonary disease) (HCC)    Depression    Diabetes mellitus without complication (HCC)    GERD (gastroesophageal reflux disease)    Has artificial bladder    Hypertension    Hypothyroidism    Pneumonia    Shortness of breath dyspnea    Sleep apnea     Current Outpatient Medications  Medication Sig Dispense Refill   DULoxetine (CYMBALTA) 60 MG capsule Take 60 mg by mouth daily.     Dupilumab (DUPIXENT [place]) Inject 1 Dose into the skin every 14 (fourteen) days.     ELIQUIS 5 MG TABS tablet Take 5 mg by mouth 2 (two) times daily.     glipiZIDE (GLUCOTROL XL) 10 MG 24 hr tablet Take 10 mg by mouth daily with breakfast.     HYDROcodone-acetaminophen (NORCO) 5-325 MG tablet Take 1 tablet by mouth every 6 (six) hours as needed for severe pain. 7 tablet 0   Ipratropium-Albuterol (COMBIVENT RESPIMAT) 20-100  MCG/ACT AERS respimat Inhale 1 puff into the lungs 4 (four) times daily.     magnesium oxide (MAG-OX) 400 MG tablet Take 400 mg by mouth 2 (two) times daily.      methocarbamol (ROBAXIN) 750 MG tablet Take 750 mg by mouth 3 (three) times daily.      pantoprazole (PROTONIX) 40 MG tablet Take 40 mg by mouth 2 (two) times daily.     pregabalin (LYRICA) 150 MG capsule Take 150 mg by mouth 3 (three) times daily.     traMADol (ULTRAM) 50 MG tablet Take 50 mg by mouth 2 (two) times daily.     verapamil (CALAN-SR) 120 MG CR tablet Take 120 mg by mouth 2 (two) times daily. Pt reports taking twice a day     atorvastatin (LIPITOR) 40 MG tablet Take 1 tablet (40 mg total) by mouth daily. 90 tablet 0   No current facility-administered medications for this visit.    Allergies as of 10/02/2021 - Review Complete 10/02/2021  Allergen Reaction Noted   Metformin and related Other (See Comments) 07/30/2016   Morphine and related Hives 08/23/2015   Other Other (See Comments) 10/09/2016   Sulfa antibiotics Itching 08/23/2015    ROS:  General: Negative for anorexia, weight loss, fever, chills, fatigue, weakness. ENT: Negative for hoarseness, difficulty swallowing , nasal congestion. CV: Negative for chest pain, angina, palpitations,  dyspnea on exertion, peripheral edema.  Respiratory: Negative for dyspnea at rest, dyspnea on exertion, cough, sputum, wheezing.  GI: See history of present illness. GU:  Negative for dysuria, hematuria, urinary incontinence, urinary frequency, nocturnal urination.  Endo: Negative for unusual weight change.    Physical Examination:   BP 134/74 (BP Location: Left Arm, Patient Position: Sitting, Cuff Size: Normal)   Pulse 78   Ht 5' (1.524 m)   Wt 159 lb 12.8 oz (72.5 kg)   LMP 07/30/2015 Comment: tubal ligation  BMI 31.21 kg/m   General: Well-nourished, well-developed in no acute distress.  Eyes: No icterus. Conjunctivae pink. Neuro: Alert and oriented x 3.  Grossly  intact. Skin: Warm and dry, no jaundice.   Psych: Alert and cooperative, normal mood and affect.  Labs:    Imaging Studies: No results found.  Assessment and Plan:   Kerri Steele is a 60 y.o. y/o female who comes in today with a history of iron deficiency anemia on blood thinners whose hemoglobin has been stable.  The patient had a EGD and colonoscopy with a repeat colonoscopy needed in 3 years.  The patient will have a capsule endoscopy to rule out any pathology since she is on blood thinners.  The patient has been explained the plan and agrees with it     Lucilla Lame, MD. [name]    Note: This dictation was prepared with Dragon dictation along with smaller phrase technology. Any transcriptional errors that result from this process are unintentional.

## 2021-10-15 ENCOUNTER — Encounter: Admission: RE | Payer: Self-pay | Source: Ambulatory Visit

## 2021-10-15 ENCOUNTER — Ambulatory Visit: Admission: RE | Admit: 2021-10-15 | Payer: Medicare Other | Source: Ambulatory Visit | Admitting: Gastroenterology

## 2021-10-15 SURGERY — IMAGING PROCEDURE, GI TRACT, INTRALUMINAL, VIA CAPSULE

## 2022-01-28 ENCOUNTER — Other Ambulatory Visit: Payer: Self-pay | Admitting: *Deleted

## 2022-01-28 DIAGNOSIS — D500 Iron deficiency anemia secondary to blood loss (chronic): Secondary | ICD-10-CM

## 2022-02-04 ENCOUNTER — Other Ambulatory Visit: Payer: Self-pay

## 2022-02-04 ENCOUNTER — Inpatient Hospital Stay: Payer: Medicare Other | Attending: Oncology

## 2022-02-04 DIAGNOSIS — Z7901 Long term (current) use of anticoagulants: Secondary | ICD-10-CM | POA: Insufficient documentation

## 2022-02-04 DIAGNOSIS — Z885 Allergy status to narcotic agent status: Secondary | ICD-10-CM | POA: Diagnosis not present

## 2022-02-04 DIAGNOSIS — Z833 Family history of diabetes mellitus: Secondary | ICD-10-CM | POA: Diagnosis not present

## 2022-02-04 DIAGNOSIS — Z882 Allergy status to sulfonamides status: Secondary | ICD-10-CM | POA: Insufficient documentation

## 2022-02-04 DIAGNOSIS — R0602 Shortness of breath: Secondary | ICD-10-CM | POA: Insufficient documentation

## 2022-02-04 DIAGNOSIS — I4891 Unspecified atrial fibrillation: Secondary | ICD-10-CM | POA: Insufficient documentation

## 2022-02-04 DIAGNOSIS — Z79899 Other long term (current) drug therapy: Secondary | ICD-10-CM | POA: Diagnosis not present

## 2022-02-04 DIAGNOSIS — R5383 Other fatigue: Secondary | ICD-10-CM | POA: Insufficient documentation

## 2022-02-04 DIAGNOSIS — Z7902 Long term (current) use of antithrombotics/antiplatelets: Secondary | ICD-10-CM | POA: Insufficient documentation

## 2022-02-04 DIAGNOSIS — Z806 Family history of leukemia: Secondary | ICD-10-CM | POA: Insufficient documentation

## 2022-02-04 DIAGNOSIS — Z87891 Personal history of nicotine dependence: Secondary | ICD-10-CM | POA: Insufficient documentation

## 2022-02-04 DIAGNOSIS — R42 Dizziness and giddiness: Secondary | ICD-10-CM | POA: Insufficient documentation

## 2022-02-04 DIAGNOSIS — J441 Chronic obstructive pulmonary disease with (acute) exacerbation: Secondary | ICD-10-CM | POA: Insufficient documentation

## 2022-02-04 DIAGNOSIS — D509 Iron deficiency anemia, unspecified: Secondary | ICD-10-CM | POA: Diagnosis present

## 2022-02-04 DIAGNOSIS — Z8249 Family history of ischemic heart disease and other diseases of the circulatory system: Secondary | ICD-10-CM | POA: Insufficient documentation

## 2022-02-04 DIAGNOSIS — D500 Iron deficiency anemia secondary to blood loss (chronic): Secondary | ICD-10-CM

## 2022-02-04 LAB — CBC WITH DIFFERENTIAL/PLATELET
Abs Immature Granulocytes: 0.07 10*3/uL (ref 0.00–0.07)
Basophils Absolute: 0.1 10*3/uL (ref 0.0–0.1)
Basophils Relative: 1 %
Eosinophils Absolute: 0.1 10*3/uL (ref 0.0–0.5)
Eosinophils Relative: 1 %
HCT: 20.2 % — ABNORMAL LOW (ref 36.0–46.0)
Hemoglobin: 5.4 g/dL — ABNORMAL LOW (ref 12.0–15.0)
Immature Granulocytes: 1 %
Lymphocytes Relative: 17 %
Lymphs Abs: 2 10*3/uL (ref 0.7–4.0)
MCH: 18.5 pg — ABNORMAL LOW (ref 26.0–34.0)
MCHC: 26.7 g/dL — ABNORMAL LOW (ref 30.0–36.0)
MCV: 69.2 fL — ABNORMAL LOW (ref 80.0–100.0)
Monocytes Absolute: 1 10*3/uL (ref 0.1–1.0)
Monocytes Relative: 8 %
Neutro Abs: 8.7 10*3/uL — ABNORMAL HIGH (ref 1.7–7.7)
Neutrophils Relative %: 72 %
Platelets: 507 10*3/uL — ABNORMAL HIGH (ref 150–400)
RBC: 2.92 MIL/uL — ABNORMAL LOW (ref 3.87–5.11)
RDW: 23.4 % — ABNORMAL HIGH (ref 11.5–15.5)
WBC: 12 10*3/uL — ABNORMAL HIGH (ref 4.0–10.5)
nRBC: 0.6 % — ABNORMAL HIGH (ref 0.0–0.2)

## 2022-02-04 LAB — IRON AND TIBC
Iron: 16 ug/dL — ABNORMAL LOW (ref 28–170)
Saturation Ratios: 3 % — ABNORMAL LOW (ref 10.4–31.8)
TIBC: 504 ug/dL — ABNORMAL HIGH (ref 250–450)
UIBC: 488 ug/dL

## 2022-02-04 LAB — FERRITIN: Ferritin: 2 ng/mL — ABNORMAL LOW (ref 11–307)

## 2022-02-06 ENCOUNTER — Inpatient Hospital Stay: Payer: Medicare Other | Admitting: Oncology

## 2022-02-06 ENCOUNTER — Encounter: Payer: Self-pay | Admitting: Oncology

## 2022-02-06 ENCOUNTER — Inpatient Hospital Stay: Payer: Medicare Other

## 2022-02-06 ENCOUNTER — Telehealth: Payer: Self-pay

## 2022-02-06 ENCOUNTER — Observation Stay
Admission: EM | Admit: 2022-02-06 | Discharge: 2022-02-07 | Disposition: A | Discharge status: Home / Self Care | Payer: Medicare Other | Attending: Internal Medicine | Admitting: Internal Medicine

## 2022-02-06 ENCOUNTER — Other Ambulatory Visit: Payer: Self-pay

## 2022-02-06 ENCOUNTER — Encounter: Payer: Self-pay | Admitting: Intensive Care

## 2022-02-06 VITALS — BP 128/62 | HR 77 | Temp 96.0°F | Wt 152.0 lb

## 2022-02-06 DIAGNOSIS — I10 Essential (primary) hypertension: Secondary | ICD-10-CM | POA: Diagnosis not present

## 2022-02-06 DIAGNOSIS — Z7984 Long term (current) use of oral hypoglycemic drugs: Secondary | ICD-10-CM | POA: Diagnosis not present

## 2022-02-06 DIAGNOSIS — D649 Anemia, unspecified: Secondary | ICD-10-CM

## 2022-02-06 DIAGNOSIS — I480 Paroxysmal atrial fibrillation: Secondary | ICD-10-CM | POA: Diagnosis not present

## 2022-02-06 DIAGNOSIS — Z7982 Long term (current) use of aspirin: Secondary | ICD-10-CM | POA: Diagnosis not present

## 2022-02-06 DIAGNOSIS — E1169 Type 2 diabetes mellitus with other specified complication: Secondary | ICD-10-CM | POA: Insufficient documentation

## 2022-02-06 DIAGNOSIS — Z7901 Long term (current) use of anticoagulants: Secondary | ICD-10-CM

## 2022-02-06 DIAGNOSIS — Z91040 Latex allergy status: Secondary | ICD-10-CM | POA: Insufficient documentation

## 2022-02-06 DIAGNOSIS — E039 Hypothyroidism, unspecified: Secondary | ICD-10-CM | POA: Diagnosis not present

## 2022-02-06 DIAGNOSIS — Q2112 Patent foramen ovale: Secondary | ICD-10-CM | POA: Diagnosis not present

## 2022-02-06 DIAGNOSIS — J45909 Unspecified asthma, uncomplicated: Secondary | ICD-10-CM | POA: Diagnosis not present

## 2022-02-06 DIAGNOSIS — I471 Supraventricular tachycardia: Secondary | ICD-10-CM | POA: Diagnosis not present

## 2022-02-06 DIAGNOSIS — J449 Chronic obstructive pulmonary disease, unspecified: Secondary | ICD-10-CM | POA: Diagnosis not present

## 2022-02-06 DIAGNOSIS — D500 Iron deficiency anemia secondary to blood loss (chronic): Secondary | ICD-10-CM

## 2022-02-06 DIAGNOSIS — D509 Iron deficiency anemia, unspecified: Secondary | ICD-10-CM | POA: Diagnosis not present

## 2022-02-06 DIAGNOSIS — R0602 Shortness of breath: Secondary | ICD-10-CM | POA: Diagnosis present

## 2022-02-06 DIAGNOSIS — Z87891 Personal history of nicotine dependence: Secondary | ICD-10-CM | POA: Insufficient documentation

## 2022-02-06 DIAGNOSIS — I639 Cerebral infarction, unspecified: Secondary | ICD-10-CM | POA: Diagnosis present

## 2022-02-06 LAB — CBC WITH DIFFERENTIAL/PLATELET
Abs Immature Granulocytes: 0.06 10*3/uL (ref 0.00–0.07)
Basophils Absolute: 0.1 10*3/uL (ref 0.0–0.1)
Basophils Relative: 1 %
Eosinophils Absolute: 0.2 10*3/uL (ref 0.0–0.5)
Eosinophils Relative: 2 %
HCT: 20.5 % — ABNORMAL LOW (ref 36.0–46.0)
Hemoglobin: 5.4 g/dL — ABNORMAL LOW (ref 12.0–15.0)
Immature Granulocytes: 1 %
Lymphocytes Relative: 20 %
Lymphs Abs: 2 10*3/uL (ref 0.7–4.0)
MCH: 17.9 pg — ABNORMAL LOW (ref 26.0–34.0)
MCHC: 26.3 g/dL — ABNORMAL LOW (ref 30.0–36.0)
MCV: 68.1 fL — ABNORMAL LOW (ref 80.0–100.0)
Monocytes Absolute: 0.8 10*3/uL (ref 0.1–1.0)
Monocytes Relative: 8 %
Neutro Abs: 6.9 10*3/uL (ref 1.7–7.7)
Neutrophils Relative %: 68 %
Platelets: 525 10*3/uL — ABNORMAL HIGH (ref 150–400)
RBC: 3.01 MIL/uL — ABNORMAL LOW (ref 3.87–5.11)
RDW: 24.1 % — ABNORMAL HIGH (ref 11.5–15.5)
Smear Review: INCREASED
WBC: 10 10*3/uL (ref 4.0–10.5)
nRBC: 0.3 % — ABNORMAL HIGH (ref 0.0–0.2)

## 2022-02-06 LAB — URINALYSIS, ROUTINE W REFLEX MICROSCOPIC
Bacteria, UA: NONE SEEN
Bilirubin Urine: NEGATIVE
Glucose, UA: NEGATIVE mg/dL
Ketones, ur: NEGATIVE mg/dL
Leukocytes,Ua: NEGATIVE
Nitrite: NEGATIVE
Protein, ur: NEGATIVE mg/dL
Specific Gravity, Urine: 1.008 (ref 1.005–1.030)
pH: 7 (ref 5.0–8.0)

## 2022-02-06 LAB — COMPREHENSIVE METABOLIC PANEL
ALT: 16 U/L (ref 0–44)
AST: 19 U/L (ref 15–41)
Albumin: 4 g/dL (ref 3.5–5.0)
Alkaline Phosphatase: 92 U/L (ref 38–126)
Anion gap: 9 (ref 5–15)
BUN: 15 mg/dL (ref 6–20)
CO2: 29 mmol/L (ref 22–32)
Calcium: 9.2 mg/dL (ref 8.9–10.3)
Chloride: 100 mmol/L (ref 98–111)
Creatinine, Ser: 0.93 mg/dL (ref 0.44–1.00)
GFR, Estimated: >60 mL/min (ref >60)
Glucose, Bld: 240 mg/dL — ABNORMAL HIGH (ref 70–99)
Potassium: 3.5 mmol/L (ref 3.5–5.1)
Sodium: 138 mmol/L (ref 135–145)
Total Bilirubin: 0.6 mg/dL (ref 0.3–1.2)
Total Protein: 7 g/dL (ref 6.5–8.1)

## 2022-02-06 LAB — IRON AND TIBC
Iron: 11 ug/dL — ABNORMAL LOW (ref 28–170)
Saturation Ratios: 2 % — ABNORMAL LOW (ref 10.4–31.8)
TIBC: 535 ug/dL — ABNORMAL HIGH (ref 250–450)
UIBC: 524 ug/dL

## 2022-02-06 LAB — GLUCOSE, CAPILLARY: Glucose-Capillary: 82 mg/dL (ref 70–99)

## 2022-02-06 LAB — TROPONIN I (HIGH SENSITIVITY)
Troponin I (High Sensitivity): 5 ng/L (ref <18)
Troponin I (High Sensitivity): 6 ng/L (ref <18)

## 2022-02-06 LAB — FOLATE: Folate: 17.3 ng/mL (ref >5.9)

## 2022-02-06 LAB — TSH: TSH: 3.905 u[IU]/mL (ref 0.350–4.500)

## 2022-02-06 MED ORDER — PANTOPRAZOLE SODIUM 40 MG PO TBEC
40.0000 mg | DELAYED_RELEASE_TABLET | Freq: Two times a day (BID) | ORAL | Status: DC
  Administered 2022-02-06 – 2022-02-07 (×2): 40 mg via ORAL
  Filled 2022-02-06 (×2): qty 1

## 2022-02-06 MED ORDER — ACETAMINOPHEN 325 MG PO TABS: 650.0000 mg | ORAL_TABLET | Freq: Four times a day (QID) | ORAL | Status: DC | PRN

## 2022-02-06 MED ORDER — ONDANSETRON HCL 4 MG PO TABS: 4.0000 mg | ORAL_TABLET | Freq: Four times a day (QID) | ORAL | Status: DC | PRN

## 2022-02-06 MED ORDER — LEVALBUTEROL HCL 0.63 MG/3ML IN NEBU: 0.6300 mg | INHALATION_SOLUTION | Freq: Four times a day (QID) | RESPIRATORY_TRACT | Status: DC | PRN

## 2022-02-06 MED ORDER — DULOXETINE HCL 30 MG PO CPEP
60.0000 mg | ORAL_CAPSULE | Freq: Every day | ORAL | Status: DC
  Administered 2022-02-07: 60 mg via ORAL
  Filled 2022-02-06: qty 2

## 2022-02-06 MED ORDER — VERAPAMIL HCL ER 120 MG PO TBCR
120.0000 mg | EXTENDED_RELEASE_TABLET | Freq: Two times a day (BID) | ORAL | Status: DC
Start: 2022-02-07 — End: 2022-02-07

## 2022-02-06 MED ORDER — ONDANSETRON HCL 4 MG/2ML IJ SOLN: 4.0000 mg | Freq: Four times a day (QID) | INTRAMUSCULAR | Status: DC | PRN

## 2022-02-06 MED ORDER — ACETAMINOPHEN 650 MG RE SUPP: 650.0000 mg | Freq: Four times a day (QID) | RECTAL | Status: DC | PRN

## 2022-02-06 MED ORDER — SODIUM CHLORIDE 0.9 % IV SOLN
10.0000 mL/h | Freq: Once | INTRAVENOUS | Status: AC
  Administered 2022-02-06: 10 mL/h via INTRAVENOUS

## 2022-02-06 NOTE — ED Provider Notes (Signed)
? ?[facility] ?Provider Note ? ? ? Event Date/Time  ? First MD Initiated Contact with Patient 02/06/22 1616   ?  (approximate) ? ? ?History  ? ?Abnormal Lab ? ? ?HPI ? ?Kerri Steele is a 61 y.o. female who was sent from the cancer center for low blood count.  She reports she has been very tired and weak in the last several weeks.  She had an upper and lower endoscopy in August of last year which was negative.  She denies having menstrual periods or losing any blood anywhere that she knows about.  She has not had any black tarry stools or blood in the stools.  She did have a recent cardiac defect closed and is now on aspirin Plavix and Eliquis. ? ?  ? ? ?Physical Exam  ? ?Triage Vital Signs: ?ED Triage Vitals  ?Enc Vitals Group  ?   BP 02/06/22 1415 (!) 136/58  ?   Pulse Rate 02/06/22 1415 78  ?   Resp 02/06/22 1415 (!) 22  ?   Temp 02/06/22 1415 98 ?F (36.7 ?C)  ?   Temp Source 02/06/22 1415 Oral  ?   SpO2 02/06/22 1415 100 %  ?   Weight 02/06/22 1417 159 lb (72.1 kg)  ?   Height 02/06/22 1417 5' (1.524 m)  ?   Head Circumference --   ?   Peak Flow --   ?   Pain Score 02/06/22 1417 5  ?   Pain Loc --   ?   Pain Edu? --   ?   Excl. in [place]? --   ? ? ?Most recent vital signs: ?Vitals:  ? 02/06/22 1415 02/06/22 1635  ?BP: (!) 136/58 (!) 146/61  ?Pulse: 78 71  ?Resp: (!) 22 18  ?Temp: 98 ?F (36.7 ?C)   ?SpO2: 100% 100%  ? ? ? ?General: Awake, no distress.  ?Mouth: No erythema or exudate ?CV:  Good peripheral perfusion.  Heart regular rate and rhythm no audible murmurs ?Resp:  Normal effort.  Lungs are clear ?Abd:  No distention.  Soft nontender ?Rectal: No hemorrhoids seen no masses palpated stool is Hemoccult negative ?Extremities: No edema ? ? ?ED Results / Procedures / Treatments  ? ?Labs ?(all labs ordered are listed, but only abnormal results are displayed) ?Labs Reviewed  ?CBC WITH DIFFERENTIAL/PLATELET - Abnormal; Notable for the following components:  ?    Result Value  ? RBC 3.01 (*)   ?  Hemoglobin 5.4 (*)   ? HCT 20.5 (*)   ? MCV 68.1 (*)   ? MCH 17.9 (*)   ? MCHC 26.3 (*)   ? RDW 24.1 (*)   ? Platelets 525 (*)   ? nRBC 0.3 (*)   ? All other components within normal limits  ?COMPREHENSIVE METABOLIC PANEL - Abnormal; Notable for the following components:  ? Glucose, Bld 240 (*)   ? All other components within normal limits  ?URINALYSIS, ROUTINE W REFLEX MICROSCOPIC  ?TYPE AND SCREEN  ?PREPARE RBC (CROSSMATCH)  ?TROPONIN I (HIGH SENSITIVITY)  ? ? ? ?EKG ? ? ? ?RADIOLOGY ? ? ? ?PROCEDURES: ? ?Critical Care performed:  ? ?Procedures ? ? ?MEDICATIONS ORDERED IN ED: ?Medications  ?0.9 %  sodium chloride infusion (has no administration in time range)  ? ? ? ?IMPRESSION / MDM / ASSESSMENT AND PLAN / ED COURSE  ?I reviewed the triage vital signs and the nursing notes. ?Patient reports she had severe achiness in her chest  radiating through to the back 2 to 3 days ago.  She had not had that before and does not have it now. ? ?Patient does have anemia which is symptomatic.  We will transfuse her.  She possibly could just not be producing enough red cells.  She did have a previous endoscopy and has no history of losing blood in her stool that we know about.  Stool was Hemoccult negative.  She is not having menses for 17 years.  I have paged GI who will consult. ? ?The patient is on the cardiac monitor to evaluate for evidence of arrhythmia and/or significant heart rate changes ?----------------------------------------- ?6:08 PM on 02/06/2022 ?----------------------------------------- ? ? PA-C Gwynneth Munson just sent this: ?I saw and evaluated patient just now. I suspect her anemia is non-GI related since she is not having any overt GIB or concerning GI symptoms and she was hemoccult negative here. She had negative EGD/CSY 05/2021 with Dr. Allen Norris and was advised to have capsule endoscopy to rule out small bowel bleeding but cancelled this due to cost. VCE is indicated but can probably be further assessed in outpatient  setting with primary GI. I offered inpatient VCE but she declined. Agree with transfusion and IV iron infusions and monitoring clinically. GI will sign off at this time.  Thanks all ? ?FINAL CLINICAL IMPRESSION(S) / ED DIAGNOSES  ? ?Final diagnoses:  ?Symptomatic anemia  ? ? ? ?Rx / DC Orders  ? ?ED Discharge Orders   ? ? None  ? ?  ? ? ? ?Note:  This document was prepared using Dragon voice recognition software and may include unintentional dictation errors. ?  ?Nena Polio, MD ?02/06/22 1808 ? ?

## 2022-02-06 NOTE — Plan of Care (Signed)

## 2022-02-06 NOTE — Progress Notes (Signed)
?Hematology/Oncology follow up note ?[place] ?[phone] [phone] ? ? ?Patient Care Team: ?Renee Rival, NP as PCP - General (Nurse Practitioner) ? ?REFERRING PROVIDER: ?Renee Rival, NP ?CHIEF COMPLAINTS/REASON FOR VISIT:  ?Evaluation of iron deficiency anemia ? ?HISTORY OF PRESENTING ILLNESS:  ?Kerri Steele is a  61 y.o.  female with PMH listed below was seen in consultation at the request of Renee Rival, NP   for evaluation of iron deficiency anemia.  ? ?05/07/2021, patient received 1 unit of PRBC transfusion due to hemoglobin less than 7. ?05/19/2021 - 05/20/2021 patient presented emergency room for evaluation of dizziness and lightheadedness.  Hemoglobin was 7.1.  Patient received another unit of PRBC. ?Underwent EGD and colonoscopy without significant findings to explain her iron deficiency anemia.  No active bleeding spot or ulcer discovered. ?Small bowel capsule study was arranged with gastroenterology in September 2022. ? ?Patient has a history of atrial fibrillation and has been on Eliquis 2.5 mg twice daily.  ?Patient also had acute on chronic COPD exacerbation.  Was treated with a course of prednisone. ?Today she reports ongoing feeling of dizziness.  Lightheaded, fatigue.  Denies any black or bloody stool. ? ?INTERVAL HISTORY ?Kerri Steele is a 61 y.o. female who has above history reviewed by me today presents for follow up visit for management of iron deficiency anemia ? ?+ Fatigue, shortness of breath with exertion.  Accompanied by husband. ?Patient denies any bloody stool or melena. ?Patient is on aspirin, Plavix, Eliquis 2.5 mg twice daily for cardiovascular disease. ? ?Review of Systems  ?Constitutional:  Positive for fatigue. Negative for appetite change, chills and fever.  ?HENT:   Negative for hearing loss and voice change.   ?Eyes:  Negative for eye problems.  ?Respiratory:  Negative for chest tightness and cough.   ?Cardiovascular:   Negative for chest pain.  ?Gastrointestinal:  Negative for abdominal distention, abdominal pain and blood in stool.  ?Endocrine: Negative for hot flashes.  ?Genitourinary:  Negative for difficulty urinating and frequency.   ?Musculoskeletal:  Negative for arthralgias.  ?Skin:  Negative for itching and rash.  ?Neurological:  Negative for dizziness, extremity weakness and light-headedness.  ?Hematological:  Negative for adenopathy.  ?Psychiatric/Behavioral:  Negative for confusion.   ?MEDICAL HISTORY:  ?Past Medical History:  ?Diagnosis Date  ? Anginal pain ([place])   ? Anxiety   ? Asthma   ? COPD (chronic obstructive pulmonary disease) ([place])   ? Depression   ? Diabetes mellitus without complication ([place])   ? GERD (gastroesophageal reflux disease)   ? Has artificial bladder   ? Hypertension   ? Hypothyroidism   ? Pneumonia   ? Shortness of breath dyspnea   ? Sleep apnea   ? ? ?SURGICAL HISTORY: ?Past Surgical History:  ?Procedure Laterality Date  ? ANKLE SURGERY    ? BLADDER SURGERY    ? CARDIAC CATHETERIZATION Left 08/23/2015  ? Procedure: Left Heart Cath and Coronary Angiography;  Surgeon: Teodoro Spray, MD;  Location: [place] [facility];  Service: Cardiovascular;  Laterality: Left;  ? COLONOSCOPY WITH PROPOFOL N/A 05/20/2021  ? Procedure: COLONOSCOPY WITH PROPOFOL;  Surgeon: Lucilla Lame, MD;  Location: [facility] ENDOSCOPY;  Service: Endoscopy;  Laterality: N/A;  ? ESOPHAGEAL DILATION    ? ESOPHAGOGASTRODUODENOSCOPY (EGD) WITH PROPOFOL N/A 05/20/2021  ? Procedure: ESOPHAGOGASTRODUODENOSCOPY (EGD) WITH PROPOFOL;  Surgeon: Lucilla Lame, MD;  Location: [facility] ENDOSCOPY;  Service: Endoscopy;  Laterality: N/A;  ? LOOP RECORDER INSERTION N/A 06/01/2020  ? Procedure:  LOOP RECORDER INSERTION;  Surgeon: Isaias Cowman, MD;  Location: [place] CV LAB;  Service: Cardiovascular;  Laterality: N/A;  ? TEE WITHOUT CARDIOVERSION N/A 06/01/2020  ? Procedure: TRANSESOPHAGEAL ECHOCARDIOGRAM (TEE);  Surgeon: Corey Skains, MD;   Location: [facility] ORS;  Service: Cardiovascular;  Laterality: N/A;  ? TUBAL LIGATION    ? WRIST SURGERY    ? ? ?SOCIAL HISTORY: ?Social History  ? ?Socioeconomic History  ? Marital status: Married  ?  Spouse name: Not on file  ? Number of children: Not on file  ? Years of education: Not on file  ? Highest education level: Not on file  ?Occupational History  ? Not on file  ?Tobacco Use  ? Smoking status: Former  ?  Packs/day: 2.00  ?  Years: 35.00  ?  Pack years: 70.00  ?  Types: Cigarettes  ?  Quit date: 01/25/2010  ?  Years since quitting: 12.0  ? Smokeless tobacco: Never  ?Vaping Use  ? Vaping Use: Never used  ?Substance and Sexual Activity  ? Alcohol use: Not Currently  ?  Comment: quit 6 years ago  ? Drug use: Yes  ?  Comment: prescribed tramadol, oxycodone,and robaxin  ? Sexual activity: Not on file  ?Other Topics Concern  ? Not on file  ?Social History Narrative  ? Not on file  ? ?Social Determinants of Health  ? ?Financial Resource Strain: Not on file  ?Food Insecurity: Not on file  ?Transportation Needs: Not on file  ?Physical Activity: Not on file  ?Stress: Not on file  ?Social Connections: Not on file  ?Intimate Partner Violence: Not on file  ? ? ?FAMILY HISTORY: ?Family History  ?Problem Relation Age of Onset  ? Hypertension Mother   ? Diabetes Mother   ? Lupus Mother   ? Atrial fibrillation Mother   ? Heart disease Father   ? Acute myelogenous leukemia Nephew   ? ? ?ALLERGIES:  is allergic to metformin and related, morphine and related, other, and sulfa antibiotics. ? ?MEDICATIONS:  ?No current facility-administered medications for this visit.  ? ?Current Outpatient Medications  ?Medication Sig Dispense Refill  ? aspirin EC 81 MG tablet Take 81 mg by mouth daily. Swallow whole.    ? clopidogrel (PLAVIX) 75 MG tablet Take 75 mg by mouth daily.    ? DULoxetine (CYMBALTA) 60 MG capsule Take 60 mg by mouth daily.    ? Dupilumab (DUPIXENT [place]) Inject 1 Dose into the skin every 14 (fourteen) days.    ? ELIQUIS 5 MG  TABS tablet Take 2.5 mg by mouth 2 (two) times daily.    ? glipiZIDE (GLUCOTROL XL) 10 MG 24 hr tablet Take 10 mg by mouth daily with breakfast.    ? HYDROcodone-acetaminophen (NORCO) 5-325 MG tablet Take 1 tablet by mouth every 6 (six) hours as needed for severe pain. (Patient not taking: Reported on 02/06/2022) 7 tablet 0  ? Ipratropium-Albuterol (COMBIVENT RESPIMAT) 20-100 MCG/ACT AERS respimat Inhale 1 puff into the lungs 4 (four) times daily.    ? magnesium oxide (MAG-OX) 400 MG tablet Take 400 mg by mouth 2 (two) times daily.     ? methocarbamol (ROBAXIN) 750 MG tablet Take 750 mg by mouth 3 (three) times daily.     ? pantoprazole (PROTONIX) 40 MG tablet Take 40 mg by mouth 2 (two) times daily.    ? pregabalin (LYRICA) 150 MG capsule Take 150 mg by mouth 3 (three) times daily.    ? traMADol (ULTRAM) 50  MG tablet Take 100 mg by mouth 2 (two) times daily.    ? verapamil (CALAN-SR) 120 MG CR tablet Take 120 mg by mouth 2 (two) times daily. Pt reports taking twice a day    ? atorvastatin (LIPITOR) 40 MG tablet Take 1 tablet (40 mg total) by mouth daily. 90 tablet 0  ? furosemide (LASIX) 20 MG tablet Take 20 mg by mouth daily.    ? ?Facility-Administered Medications Ordered in Other Visits  ?Medication Dose Route Frequency Provider Last Rate Last Admin  ? 0.9 %  sodium chloride infusion  10 mL/hr Intravenous Once Nena Polio, MD      ? acetaminophen (TYLENOL) tablet 650 mg  650 mg Oral Q6H PRN Clance Boll, MD      ? Or  ? acetaminophen (TYLENOL) suppository 650 mg  650 mg Rectal Q6H PRN Clance Boll, MD      ? Derrill Memo ON 02/07/2022] DULoxetine (CYMBALTA) DR capsule 60 mg  60 mg Oral Daily Clance Boll, MD      ? levalbuterol Penne Lash) nebulizer solution 0.63 mg  0.63 mg Nebulization Q6H PRN Clance Boll, MD      ? ondansetron [facility]) tablet 4 mg  4 mg Oral Q6H PRN Clance Boll, MD      ? Or  ? ondansetron (ZOFRAN) injection 4 mg  4 mg Intravenous Q6H PRN Clance Boll, MD       ? pantoprazole (PROTONIX) EC tablet 40 mg  40 mg Oral BID Clance Boll, MD      ? Derrill Memo ON 02/07/2022] verapamil (CALAN-SR) CR tablet 120 mg  120 mg Oral BID Clance Boll, MD

## 2022-02-06 NOTE — Telephone Encounter (Signed)
-----   Message from Rickard Patience, MD sent at 02/05/2022 11:02 PM EDT ----- ?She is coming on 3/29. Hb below 6. Please check if she can get blood transfusion ?

## 2022-02-06 NOTE — ED Triage Notes (Signed)
Patient sent by Dr after HGB resulting 5.4 on Monday. Patient reports only symptom of feeling tired. HX COPD ?

## 2022-02-06 NOTE — Consult Note (Signed)
? ? ?GI Inpatient Consult Note ? ?Reason for Consult: Symptomatic anemia/IDA ?  ?Attending Requesting Consult: Dr. Conni Slipper ? ?History of Present Illness: ?Kerri Steele is a 61 y.o. female seen for evaluation of symptomatic anemia/IDA at the request of ED physician - Dr. Conni Slipper. Patient has a PMH of PAF on chronic anticoagulation with Eliquis, Hx of CVA, asthma, COPD, anxiety, depression, GERD, T2DM, hypothyroidism, sleep apnea, obesity, and IDA requiring transfusion and IV iron infusions. She presented to the [facility] ED this afternoon at the request of her hematologist, Dr. Tasia Catchings, for severe anemia. She had blood work on Monday of this week showing hemoglobin 5.4, MCV 69, ferritin 2, iron 16, TIBC 504, and iron sat 3%. She was unable to come for outpatient transfusion so she was advised to come to the ED. Upon presentation to the [facility] ED, repeat blood work showed hemoglobin 5.4 and MCV 68. Per ED physician, she had DRE showing brown stool which was guaiac negative. She has orders in for transfusion. GI was consulted by Dr. Cinda Quest for further evaluation. She had similar presentation July 2022 for symptomatic anemia where hemoglobin was <7 requiring transfusion. She had GI consult during this admission and is s/p bidirectional endoscopy with Dr. Allen Norris of [facility] which showed completely normal esophagus, stomach, small intestine, and colon with no specimens collected. She had IV iron infusions x4 in August - September 2022. She followed up with Dr. Allen Norris as outpatient where it was advised to have VCE to rule out small bowel bleeding, but patient cancelled this due to >$300 co-pay for procedure. Labs in January of this year showed hemoglobin 11.8. She has been unable to tolerate oral iron due to GI upset. She denies any overt gastrointestinal blood loss. She denies any hematochezia, melena, or hematemesis. She denies any vaginal bleeding. She denies any recent changes in her bowel habits. She reports bowels are  regular and brown in color. No overt tarry or bloody stool. She denies any unintentional weight loss or loss of appetite. She denies any concerning GI symptoms such as nausea, vomiting, esophageal dysphagia, odynophagia, early satiety, hoarseness, or epigastric abdominal pain. No fecal urgency or fecal incontinence. She reports her mother had issues with anemia but unsure of etiology. She endorses fatigue and weakness that have been worsening over the past few weeks. She denies any chest pain, shortness of breath, dizziness, or syncopal episodes. She is on ASA, Plavix, and Eliquis. She is s/p atrial septal defect closure 12/10/21. She has been following with Dr. Awilda Metro at [facility].  ? ? ?Past Medical History:  ?Past Medical History:  ?Diagnosis Date  ? Anginal pain ([place])   ? Anxiety   ? Asthma   ? COPD (chronic obstructive pulmonary disease) ([place])   ? Depression   ? Diabetes mellitus without complication ([place])   ? GERD (gastroesophageal reflux disease)   ? Has artificial bladder   ? Hypertension   ? Hypothyroidism   ? Pneumonia   ? Shortness of breath dyspnea   ? Sleep apnea   ?  ?Problem List: ?Patient Active Problem List  ? Diagnosis Date Noted  ? History of iron deficiency anemia 10/02/2021  ? Thickened endometrium 10/02/2021  ? Headache 08/30/2021  ? Symptomatic anemia 05/19/2021  ? COPD exacerbation ([place]) 05/19/2021  ? Iron deficiency anemia due to chronic blood loss   ? Paroxysmal atrial fibrillation ([place]) 11/16/2020  ? Abnormal levels of other serum enzymes 09/26/2020  ? Acquired hypothyroidism 09/26/2020  ? Major depression, single episode  09/26/2020  ? Vitamin D deficiency 09/26/2020  ? Supraventricular tachycardia ([place]) 09/26/2020  ? History of stroke 07/31/2020  ? Right sided weakness 07/31/2020  ? PFO (patent foramen ovale) 06/01/2020  ? Benign essential HTN 06/01/2020  ? Type 2 diabetes mellitus with hyperlipidemia ([place]) 06/01/2020  ? COPD without exacerbation ([place]) 06/01/2020  ? GERD (gastroesophageal  reflux disease) 06/01/2020  ? HLD (hyperlipidemia) 06/01/2020  ? Chronic pain disorder 06/01/2020  ? Stroke ([place]) 05/28/2020  ? Heart palpitations 03/03/2019  ? Acute colitis 07/30/2016  ? Greater trochanteric bursitis of left hip 07/06/2014  ? DDD (degenerative disc disease), lumbar 06/03/2014  ? Lumbar radiculitis 06/03/2014  ? Organ transplant candidate 06/24/2013  ? Breath shortness 10/18/2012  ?  ?Past Surgical History: ?Past Surgical History:  ?Procedure Laterality Date  ? ANKLE SURGERY    ? BLADDER SURGERY    ? CARDIAC CATHETERIZATION Left 08/23/2015  ? Procedure: Left Heart Cath and Coronary Angiography;  Surgeon: Teodoro Spray, MD;  Location: [place] CV LAB;  Service: Cardiovascular;  Laterality: Left;  ? COLONOSCOPY WITH PROPOFOL N/A 05/20/2021  ? Procedure: COLONOSCOPY WITH PROPOFOL;  Surgeon: Lucilla Lame, MD;  Location: [facility] ENDOSCOPY;  Service: Endoscopy;  Laterality: N/A;  ? ESOPHAGEAL DILATION    ? ESOPHAGOGASTRODUODENOSCOPY (EGD) WITH PROPOFOL N/A 05/20/2021  ? Procedure: ESOPHAGOGASTRODUODENOSCOPY (EGD) WITH PROPOFOL;  Surgeon: Lucilla Lame, MD;  Location: [facility] ENDOSCOPY;  Service: Endoscopy;  Laterality: N/A;  ? LOOP RECORDER INSERTION N/A 06/01/2020  ? Procedure: LOOP RECORDER INSERTION;  Surgeon: Isaias Cowman, MD;  Location: [place] CV LAB;  Service: Cardiovascular;  Laterality: N/A;  ? TEE WITHOUT CARDIOVERSION N/A 06/01/2020  ? Procedure: TRANSESOPHAGEAL ECHOCARDIOGRAM (TEE);  Surgeon: Corey Skains, MD;  Location: [facility] ORS;  Service: Cardiovascular;  Laterality: N/A;  ? TUBAL LIGATION    ? WRIST SURGERY    ?  ?Allergies: ?Allergies  ?Allergen Reactions  ? Metformin And Related Other (See Comments)  ?  migraine  ? Morphine And Related Hives  ? Other Other (See Comments)  ?  Latex allergy  ? Sulfa Antibiotics Itching  ?  ?Home Medications: ?(Not in a hospital admission) ? ?Home medication reconciliation was completed with the patient.  ? ?Scheduled Inpatient Medications: ?   ? ?Continuous Inpatient Infusions: ?  ? sodium chloride    ? ? ?PRN Inpatient Medications:  ? ? ?Family History: ?family history includes Acute myelogenous leukemia in her nephew; Atrial fibrillation in her mother; Diabetes in her mother; Heart disease in her father; Hypertension in her mother; Lupus in her mother.  The patient's family history is negative for inflammatory bowel disorders, GI malignancy, or solid organ transplantation. ? ?Social History:  ? reports that she quit smoking about 12 years ago. Her smoking use included cigarettes. She has a 70.00 pack-year smoking history. She has never used smokeless tobacco. She reports that she does not currently use alcohol. She reports current drug use. The patient denies ETOH, tobacco, or drug use.  ? ?Review of Systems: ?Constitutional: Weight is stable.  ?Eyes: No changes in vision. ?ENT: No oral lesions, sore throat.  ?GI: see HPI.  ?Heme/Lymph: No easy bruising.  ?CV: No chest pain.  ?GU: No hematuria.  ?Integumentary: No rashes.  ?Neuro: No headaches.  ?Psych: No depression/anxiety.  ?Endocrine: No heat/cold intolerance.  ?Allergic/Immunologic: No urticaria.  ?Resp: No cough, SOB.  ?Musculoskeletal: No joint swelling.  ?  ?Physical Examination: ?BP (!) 146/61 (BP Location: Left Arm)   Pulse 71   Temp 98 ?F (36.7 ?  C) (Oral)   Resp 18   Ht 5' (1.524 m)   Wt 72.1 kg   LMP 07/30/2015 Comment: tubal ligation  SpO2 100%   BMI 31.05 kg/m?  ?Gen: NAD, alert and oriented x 4 ?HEENT: PEERLA, EOMI, ?Neck: supple, no JVD or thyromegaly ?Chest: CTA bilaterally, no wheezes, crackles, or other adventitious sounds ?CV: RRR, no m/g/c/r ?Abd: soft, NT, ND, +BS in all four quadrants; no HSM, guarding, ridigity, or rebound tenderness ?Ext: no edema, well perfused with 2+ pulses, ?Skin: no rash or lesions noted ?Lymph: no LAD ? ?Data: ?Lab Results  ?Component Value Date  ? WBC 10.0 02/06/2022  ? HGB 5.4 (L) 02/06/2022  ? HCT 20.5 (L) 02/06/2022  ? MCV 68.1 (L) 02/06/2022   ? PLT 525 (H) 02/06/2022  ? ?Recent Labs  ?Lab 02/04/22 ?1432 02/06/22 ?1425  ?HGB 5.4* 5.4*  ? ?Lab Results  ?Component Value Date  ? NA 138 02/06/2022  ? K 3.5 02/06/2022  ? CL 100 02/06/2022  ? CO2 29 03/2

## 2022-02-06 NOTE — Telephone Encounter (Signed)
Spoke to patient and informed her of hgb results and the need for Blood transfusion. Pt states that she would not be able to come this morning for blood transfusion (if there was availability) and she would not be able to come tomorrow either. Pt states she will keep her appt this afternoon.  ?

## 2022-02-06 NOTE — H&P (Addendum)
?History and Physical  ? ? ?Kerri Steele [id number] DOB: 04/13/1961 DOA: 02/06/2022 ? ?PCP: Renee Rival, NP  ?Patient coming from:  [place] ?I have personally briefly reviewed patient's old medical records in [place] ? ?Chief Complaint: abn lab, low hgb ? ?HPI: Kerri Steele is a 61 y.o. female with medical history significant for asthma, COPD, anxiety, depression, GERD, T2DM, hypothyroidism, sleep apnea, obesity,CVA with no residual deficits, paroxysmal atrial fibrillation, as well as iron deficiency anemia requiring transfusion/ IV iron thought to be related to chronic occult gi losses. Of note patient is also s/p edg/c-scope 8/22 with no obvious cause of bleeding. Patient at that time was offered further work up but declined due to prohibitive cost at that time. 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  Patient is followed by hematology for her IDA and on most recent labs patient was noted have hgb who presents to ed sent in by oncology Dr Tasia Catchings due to abn low hgb on lab of 5.4 drawn on 3/27.Of note last edg/cscope 7/22  was unremarkable.  Patient in ED was noted have heme negative stools on DRE.GI contacted by ED-Dr Alice Reichert: rec monitor and supportive care with transfusion as patient declines further work up at this time. Currently, patient states,she notes doe, fatigue but denies chest pain pain , presyncope, vaginal,rectal or nose bleeding.  ? ? ?ED Course:  ?Afeb, bp 136/58, hr 78, rr 22, sat 100% ?Labs: ?NA138, K 3.5, glu 240 ?Wbc:10, hgb 5.4, plt 525 ?Ekg:nsr ?Ce 5 ?Review of Systems: As per HPI otherwise 10 point review of systems negative.  ? ?Past Medical History:  ?Diagnosis Date  ? Anginal pain ([place])   ? Anxiety   ? Asthma   ? COPD (chronic obstructive pulmonary disease) ([place])   ? Depression   ? Diabetes mellitus without complication ([place])   ? GERD (gastroesophageal reflux disease)   ? Has artificial  bladder   ? Hypertension   ? Hypothyroidism   ? Pneumonia   ? Shortness of breath dyspnea   ? Sleep apnea   ? ? ?Past Surgical History:  ?Procedure Laterality Date  ? ANKLE SURGERY    ? BLADDER SURGERY    ? CARDIAC CATHETERIZATION Left 08/23/2015  ? Procedure: Left Heart Cath and Coronary Angiography;  Surgeon: Teodoro Spray, MD;  Location: [place] [facility];  Service: Cardiovascular;  Laterality: Left;  ? COLONOSCOPY WITH PROPOFOL N/A 05/20/2021  ? Procedure: COLONOSCOPY WITH PROPOFOL;  Surgeon: Lucilla Lame, MD;  Location: [facility] ENDOSCOPY;  Service: Endoscopy;  Laterality: N/A;  ? ESOPHAGEAL DILATION    ? ESOPHAGOGASTRODUODENOSCOPY (EGD) WITH PROPOFOL N/A 05/20/2021  ? Procedure: ESOPHAGOGASTRODUODENOSCOPY (EGD) WITH PROPOFOL;  Surgeon: Lucilla Lame, MD;  Location: [facility] ENDOSCOPY;  Service: Endoscopy;  Laterality: N/A;  ? LOOP RECORDER INSERTION N/A 06/01/2020  ? Procedure: LOOP RECORDER INSERTION;  Surgeon: Isaias Cowman, MD;  Location: [place] [facility];  Service: Cardiovascular;  Laterality: N/A;  ? TEE WITHOUT CARDIOVERSION N/A 06/01/2020  ? Procedure: TRANSESOPHAGEAL ECHOCARDIOGRAM (TEE);  Surgeon: Corey Skains, MD;  Location: [facility] ORS;  Service: Cardiovascular;  Laterality: N/A;  ? TUBAL LIGATION    ? WRIST SURGERY    ? ? ? reports that she quit smoking about 12 years ago. Her smoking use included cigarettes. She has a 70.00 pack-year smoking history. She has never used smokeless tobacco. She reports that she does not currently use alcohol. She reports current drug  use. ? ?Allergies  ?Allergen Reactions  ? Metformin And Related Other (See Comments)  ?  migraine  ? Morphine And Related Hives  ? Other Other (See Comments)  ?  Latex allergy  ? Sulfa Antibiotics Itching  ? ? ?Family History  ?Problem Relation Age of Onset  ? Hypertension Mother   ? Diabetes Mother   ? Lupus Mother   ? Atrial fibrillation Mother   ? Heart disease Father   ? Acute myelogenous leukemia Nephew   ? ? ?Prior to Admission  medications   ?Medication Sig Start Date End Date Taking? Authorizing Provider  ?aspirin EC 81 MG tablet Take 81 mg by mouth daily. Swallow whole.    [provider]  ?atorvastatin (LIPITOR) 40 MG tablet Take 1 tablet (40 mg total) by mouth daily. 06/01/20 08/31/21  [place] Eric J, DO  ?clopidogrel (PLAVIX) 75 MG tablet Take by mouth. 12/11/21 12/11/22  [provider]  ?DULoxetine (CYMBALTA) 60 MG capsule Take 60 mg by mouth daily.    [provider]  ?Dupilumab (DUPIXENT [place]) Inject 1 Dose into the skin every 14 (fourteen) days.    [provider]  ?ELIQUIS 5 MG TABS tablet Take 5 mg by mouth 2 (two) times daily. 05/15/21   [provider]  ?glipiZIDE (GLUCOTROL XL) 10 MG 24 hr tablet Take 10 mg by mouth daily with breakfast.    [provider]  ?HYDROcodone-acetaminophen (NORCO) 5-325 MG tablet Take 1 tablet by mouth every 6 (six) hours as needed for severe pain. 08/01/17   Darel Hong, MD  ?Ipratropium-Albuterol (COMBIVENT RESPIMAT) 20-100 MCG/ACT AERS respimat Inhale 1 puff into the lungs 4 (four) times daily.    [provider]  ?magnesium oxide (MAG-OX) 400 MG tablet Take 400 mg by mouth 2 (two) times daily.     [provider]  ?methocarbamol (ROBAXIN) 750 MG tablet Take 750 mg by mouth 3 (three) times daily.     [provider]  ?pantoprazole (PROTONIX) 40 MG tablet Take 40 mg by mouth 2 (two) times daily. 09/15/21   [provider]  ?pregabalin (LYRICA) 150 MG capsule Take 150 mg by mouth 3 (three) times daily. 05/22/20   [provider]  ?traMADol (ULTRAM) 50 MG tablet Take 50 mg by mouth 2 (two) times daily.    [provider]  ?verapamil (CALAN-SR) 120 MG CR tablet Take 120 mg by mouth 2 (two) times daily. Pt reports taking twice a day 02/06/17   [provider]  ? ? ?Physical Exam: ?Vitals:  ? 02/06/22 1751 02/06/22 1756 02/06/22 1815 02/06/22 1816  ?BP: 136/61 136/61  139/73  ?Pulse: 66 66   68  ?Resp: 18 18  18   ?Temp: 98.5 ?F (36.9 ?C) 98.5 ?F (36.9 ?C) 98.5 ?F (36.9 ?C) 98.5 ?F (36.9 ?C)  ?TempSrc: Oral Oral Oral   ?SpO2: 100% 100%  100%  ?Weight:      ?Height:      ? ? ? ?Vitals:  ? 02/06/22 1751 02/06/22 1756 02/06/22 1815 02/06/22 1816  ?BP: 136/61 136/61  139/73  ?Pulse: 66 66  68  ?Resp: 18 18  18   ?Temp: 98.5 ?F (36.9 ?C) 98.5 ?F (36.9 ?C) 98.5 ?F (36.9 ?C) 98.5 ?F (36.9 ?C)  ?TempSrc: Oral Oral Oral   ?SpO2: 100% 100%  100%  ?Weight:      ?Height:      ?Constitutional: NAD, calm, comfortable ?Eyes: PERRL, lids and conjunctivae normal ?ENMT: Mucous membranes are moist. Posterior pharynx clear  of any exudate or lesions.Normal dentition.  ?Neck: normal, supple, no masses, no thyromegaly ?Respiratory: clear to auscultation bilaterally, no wheezing, no crackles. Normal respiratory effort. No accessory muscle use.  ?Cardiovascular: irRegular rate and rhythm, no murmurs / rubs / gallops. No extremity edema. 2+ pedal pulses. No carotid bruits.  ?Abdomen: no tenderness, no masses palpated. No hepatosplenomegaly. Bowel sounds positive.  ?Musculoskeletal: no clubbing / cyanosis. No joint deformity upper and lower extremities. Good ROM, no contractures. Normal muscle tone.  ?Skin: no rashes, lesions, ulcers. No induration ?Neurologic: CN 2-12 grossly intact. Sensation intact, DTR normal. Strength 5/5 in all 4.  ?Psychiatric: Normal judgment and insight. Alert and oriented x 3. Normal mood.  ? ? ?Labs on Admission: I have personally reviewed following labs and imaging studies ? ?CBC: ?Recent Labs  ?Lab 02/04/22 ?1432 02/06/22 ?1425  ?WBC 12.0* 10.0  ?NEUTROABS 8.7* 6.9  ?HGB 5.4* 5.4*  ?HCT 20.2* 20.5*  ?MCV 69.2* 68.1*  ?PLT 507* 525*  ? ?Basic Metabolic Panel: ?Recent Labs  ?Lab 02/06/22 ?1425  ?NA 138  ?K 3.5  ?CL 100  ?CO2 29  ?GLUCOSE 240*  ?BUN 15  ?CREATININE 0.93  ?CALCIUM 9.2  ? ?GFR: ?Estimated Creatinine Clearance: 57.7 mL/min (by C-G formula based on SCr of 0.93 mg/dL). ?Liver Function  Tests: ?Recent Labs  ?Lab 02/06/22 ?1425  ?AST 19  ?ALT 16  ?ALKPHOS 92  ?BILITOT 0.6  ?PROT 7.0  ?ALBUMIN 4.0  ? ?No results for input(s): LIPASE, AMYLASE in the last 168 hours. ?No results for input(s): AM

## 2022-02-06 NOTE — ED Notes (Signed)
This tech answered pt call light. Pt stated, "is there any way I can talk to the doctor? I don't think I need to be admitted. I can follow up with my doctor and set up an appointment for another transfusion." This tech sent a secured chat message to MD Maisie Fus.  ?

## 2022-02-07 ENCOUNTER — Telehealth: Payer: Self-pay

## 2022-02-07 DIAGNOSIS — I639 Cerebral infarction, unspecified: Secondary | ICD-10-CM

## 2022-02-07 DIAGNOSIS — E1169 Type 2 diabetes mellitus with other specified complication: Secondary | ICD-10-CM

## 2022-02-07 DIAGNOSIS — E785 Hyperlipidemia, unspecified: Secondary | ICD-10-CM

## 2022-02-07 DIAGNOSIS — Q2112 Patent foramen ovale: Secondary | ICD-10-CM

## 2022-02-07 DIAGNOSIS — I471 Supraventricular tachycardia: Secondary | ICD-10-CM | POA: Diagnosis not present

## 2022-02-07 DIAGNOSIS — D500 Iron deficiency anemia secondary to blood loss (chronic): Secondary | ICD-10-CM | POA: Diagnosis not present

## 2022-02-07 DIAGNOSIS — D649 Anemia, unspecified: Secondary | ICD-10-CM | POA: Diagnosis not present

## 2022-02-07 LAB — GLUCOSE, CAPILLARY: Glucose-Capillary: 73 mg/dL (ref 70–99)

## 2022-02-07 LAB — HEMOGLOBIN AND HEMATOCRIT, BLOOD
HCT: 29.3 % — ABNORMAL LOW (ref 36.0–46.0)
Hemoglobin: 8.6 g/dL — ABNORMAL LOW (ref 12.0–15.0)

## 2022-02-07 LAB — VITAMIN B12: Vitamin B-12: 845 pg/mL (ref 180–914)

## 2022-02-07 LAB — HIV ANTIBODY (ROUTINE TESTING W REFLEX): HIV Screen 4th Generation wRfx: NONREACTIVE

## 2022-02-07 NOTE — Assessment & Plan Note (Addendum)
The patient is on 3 blood thinners at home aspirin, Plavix and Eliquis.  The patient does not see any blood in her bowel movements.  Likely this is a slow chronic bleed.  Will drop off aspirin at this point.  We will have to follow-up with a cardiologist to determine what to do with the Plavix and Eliquis.  Again patient declined IV iron prior to disposition. ?

## 2022-02-07 NOTE — Assessment & Plan Note (Signed)
Follow-up with cardiology as outpatient.  Status post closure of this PFO by cardiology ?

## 2022-02-07 NOTE — Assessment & Plan Note (Signed)
Patient came in with a hemoglobin of 5.4 was given 2 units of packed red blood cells and hemoglobin came up to 8.6.  Declined any further work-up for this.  Declined IV iron prior to disposition. ?

## 2022-02-07 NOTE — Assessment & Plan Note (Signed)
Patient can continue her Lipitor, glipizide. ?

## 2022-02-07 NOTE — Clinical Social Work Note (Signed)
?  Transition of Care (TOC) Screening Note ? ? ?Patient Details  ?Name: Kerri Steele ?Date of Birth: March 01, 1961 ? ? ?Transition of Care (TOC) CM/SW Contact:    ?Cherylann Hobday A Jacquel Mccamish, LCSW ?Phone Number:[phone] ?02/07/2022, 8:50 AM ? ? ? ?Transition of Care Department [facility]) has reviewed patient and no TOC needs have been identified at this time. We will continue to monitor patient advancement through interdisciplinary progression rounds. If new patient transition needs arise, please place a TOC consult. ?  ?

## 2022-02-07 NOTE — Progress Notes (Signed)
Patient discharged in stable condition via w/c. Verbalized understanding of all discharge instructions including follow up appointments.  ?

## 2022-02-07 NOTE — Assessment & Plan Note (Signed)
Continue Calan CR ?

## 2022-02-07 NOTE — Discharge Summary (Signed)
?Physician Discharge Summary ?  ?Patient: Kerri Steele MRN: [id number] DOB: 1961-04-21  ?Admit date:     02/06/2022  ?Discharge date: 02/07/22  ?Discharge Physician: Loletha Grayer  ? ?PCP: Renee Rival, NP  ? ?Recommendations at discharge:  ? ?Follow-up PCP 5 days ?Follow-up hematology 1 week ?Follow-up with your cardiologist ? ?Discharge Diagnoses: ?Principal Problem: ?  Severe anemia ?Active Problems: ?  Iron deficiency anemia due to chronic blood loss ?  Supraventricular tachycardia ([place]) ?  Stroke [facility]) ?  PFO (patent foramen ovale) ?  Type 2 diabetes mellitus with hyperlipidemia ([place]) ? ? ? ?Hospital Course: ?The patient was brought in as an observation given 2 units of packed red blood cells.  Refused further work-up and iron infusion prior to disposition.  Hemoglobin came up from 5.4-8.6. ? ?Assessment and Plan: ?* Severe anemia ?Patient came in with a hemoglobin of 5.4 was given 2 units of packed red blood cells and hemoglobin came up to 8.6.  Declined any further work-up for this.  Declined IV iron prior to disposition. ? ?Iron deficiency anemia due to chronic blood loss ?The patient is on 3 blood thinners at home aspirin, Plavix and Eliquis.  The patient does not see any blood in her bowel movements.  Likely this is a slow chronic bleed.  Will drop off aspirin at this point.  We will have to follow-up with a cardiologist to determine what to do with the Plavix and Eliquis.  Again patient declined IV iron prior to disposition. ? ?Supraventricular tachycardia ([place]) ?Continue Calan CR ? ?Type 2 diabetes mellitus with hyperlipidemia ([place]) ?Patient can continue her Lipitor, glipizide. ? ?PFO (patent foramen ovale) ?Follow-up with cardiology as outpatient.  Status post closure of this PFO by cardiology ? ?Stroke [facility]) ?Patient will go back on Eliquis and Plavix. ? ? ? ? ?  ? ? ?Consultants: Gastroenterology, oncology ?Procedures performed: None ?Disposition: Home ?Diet recommendation:  ?Cardiac and Carb  modified diet ?DISCHARGE MEDICATION: ?Allergies as of 02/07/2022   ? ?   Reactions  ? Metformin And Related Other (See Comments)  ? migraine  ? Morphine And Related Hives  ? Other Other (See Comments)  ? Latex allergy  ? Sulfa Antibiotics Itching  ? ?  ? ?  ?Medication List  ?  ? ?STOP taking these medications   ? ?aspirin EC 81 MG tablet ?  ?atorvastatin 40 MG tablet ?Commonly known as: LIPITOR ?  ?HYDROcodone-acetaminophen 5-325 MG tablet ?Commonly known as: Norco ?  ? ?  ? ?TAKE these medications   ? ?clopidogrel 75 MG tablet ?Commonly known as: PLAVIX ?Take 75 mg by mouth daily. ?  ?Combivent Respimat 20-100 MCG/ACT Aers respimat ?Generic drug: Ipratropium-Albuterol ?Inhale 1 puff into the lungs 4 (four) times daily. ?  ?DULoxetine 60 MG capsule ?Commonly known as: CYMBALTA ?Take 60 mg by mouth daily. ?  ?DUPIXENT [place] ?Inject 1 Dose into the skin every 14 (fourteen) days. ?  ?Eliquis 5 MG Tabs tablet ?Generic drug: apixaban ?Take 2.5 mg by mouth 2 (two) times daily. ?  ?furosemide 20 MG tablet ?Commonly known as: LASIX ?Take 20 mg by mouth daily. ?  ?glipiZIDE 10 MG 24 hr tablet ?Commonly known as: GLUCOTROL XL ?Take 10 mg by mouth daily with breakfast. ?  ?magnesium oxide 400 MG tablet ?Commonly known as: MAG-OX ?Take 400 mg by mouth 2 (two) times daily. ?  ?methocarbamol 750 MG tablet ?Commonly known as: ROBAXIN ?Take 750 mg by mouth 3 (three) times daily. ?  ?pantoprazole  40 MG tablet ?Commonly known as: PROTONIX ?Take 40 mg by mouth 2 (two) times daily. ?  ?pregabalin 150 MG capsule ?Commonly known as: LYRICA ?Take 150 mg by mouth 3 (three) times daily. ?  ?traMADol 50 MG tablet ?Commonly known as: ULTRAM ?Take 100 mg by mouth 2 (two) times daily. ?  ?verapamil 120 MG CR tablet ?Commonly known as: CALAN-SR ?Take 120 mg by mouth 2 (two) times daily. Pt reports taking twice a day ?  ? ?  ? ? Follow-up Information   ? ? Renee Rival, NP Follow up in 5 day(s).   ?Specialty: Nurse Practitioner ?Why: Patient  to make own follow up appt ?Contact information: ?[address] ?[place] [address] ?[phone] ? ? ?  ?  ? ? Earlie Server, MD Follow up in 1 week(s).   ?Specialty: Oncology ?Why: Patient to make own follow up appt ?Contact information: ?[place] [address] ?[phone] ? ? ?  ?  ? ?  ?  ? ?  ? ?I called the patient and I told her she can go back on her Lipitor (atorvastatin). ? ?Discharge Exam: ?Filed Weights  ? 02/06/22 1417  ?Weight: 72.1 kg  ?Blood pressure 130/59, pulse 81 respirations 16 pulse ox 100% on room air temperature 98.2 ?Physical Exam ?HENT:  ?   Head: Normocephalic.  ?   Mouth/Throat:  ?   Pharynx: No oropharyngeal exudate.  ?Eyes:  ?   General: Lids are normal.  ?   Conjunctiva/sclera: Conjunctivae normal.  ?Cardiovascular:  ?   Rate and Rhythm: Normal rate and regular rhythm.  ?   Heart sounds: Normal heart sounds, S1 normal and S2 normal.  ?Pulmonary:  ?   Breath sounds: No decreased breath sounds, wheezing, rhonchi or rales.  ?Abdominal:  ?   Palpations: Abdomen is soft.  ?   Tenderness: There is no abdominal tenderness.  ?Musculoskeletal:  ?   Right lower leg: No swelling.  ?   Left lower leg: No swelling.  ?Skin: ?   General: Skin is warm.  ?   Findings: No rash.  ?Neurological:  ?   Mental Status: She is alert and oriented to person, place, and time.  ?  ? ?Condition at discharge: Satisfactory ? ?The results of significant diagnostics from this hospitalization (including imaging, microbiology, ancillary and laboratory) are listed below for reference.  ? ? ? ?Microbiology: ?Results for orders placed or performed during the hospital encounter of 08/30/21  ?Resp Panel by RT-PCR (Flu A&B, Covid) Urine, Clean Catch     Status: None  ? Collection Time: 08/30/21  7:23 PM  ? Specimen: Urine, Clean Catch; Nasopharyngeal(NP) swabs in vial transport medium  ?Result Value Ref Range Status  ? SARS Coronavirus 2 by RT PCR NEGATIVE NEGATIVE Final  ?  Comment: (NOTE) ?SARS-CoV-2  target nucleic acids are NOT DETECTED. ? ?The SARS-CoV-2 RNA is generally detectable in upper respiratory ?specimens during the acute phase of infection. The lowest ?concentration of SARS-CoV-2 viral copies this assay can detect is ?138 copies/mL. A negative result does not preclude SARS-Cov-2 ?infection and should not be used as the sole basis for treatment or ?other patient management decisions. A negative result may occur with  ?improper specimen collection/handling, submission of specimen other ?than nasopharyngeal swab, presence of viral mutation(s) within the ?areas targeted by this assay, and inadequate number of viral ?copies(<138 copies/mL). A negative result must be combined with ?clinical observations, patient history, and epidemiological ?information. The expected result is  Negative. ? ?Fact Sheet for Patients:  ?EntrepreneurPulse.com.au ? ?Fact Sheet for Healthcare Providers:  ?IncredibleEmployment.be ? ?This test is no t yet approved or cleared by the [place] FDA and  ?has been authorized for detection and/or diagnosis of SARS-CoV-2 by ?FDA under an Emergency Use Authorization (EUA). This EUA will remain  ?in effect (meaning this test can be used) for the duration of the ?COVID-19 declaration under Section 564(b)(1) of the Act, 21 ?U.S.C.section 360bbb-3(b)(1), unless the authorization is terminated  ?or revoked sooner.  ? ? ?  ? Influenza A by PCR NEGATIVE NEGATIVE Final  ? Influenza B by PCR NEGATIVE NEGATIVE Final  ?  Comment: (NOTE) ?The Xpert Xpress SARS-CoV-2/FLU/RSV plus assay is intended as an aid ?in the diagnosis of influenza from Nasopharyngeal swab specimens and ?should not be used as a sole basis for treatment. Nasal washings and ?aspirates are unacceptable for Xpert Xpress SARS-CoV-2/FLU/RSV ?testing. ? ?Fact Sheet for Patients: ?EntrepreneurPulse.com.au ? ?Fact Sheet for Healthcare  Providers: ?IncredibleEmployment.be ? ?This test is not yet approved or cleared by the [place] FDA and ?has been authorized for detection and/or diagnosis of SARS-CoV-2 by ?FDA under an Emergency Use Authorization (EUA). This EUA

## 2022-02-07 NOTE — Assessment & Plan Note (Signed)
Patient will go back on Eliquis and Plavix. ?

## 2022-02-07 NOTE — Telephone Encounter (Signed)
-----   Message from Rickard Patience, MD sent at 02/06/2022  9:04 PM EDT ----- ?She declined to be admitted. Will most likely get blood transfusion and be discharged.  ?Please arrange her to get IV venofer weekly x 4.  ?Follow up in 3 months, labs prior to MD + venofer.  ?Please advise her to make follow up appointment with Dr.Wohl and also her heart doctor to see if she can come off some of her blood thinners.  ? ?

## 2022-02-07 NOTE — Telephone Encounter (Signed)
Patient informed of follow up plan and to contact GI and cardiology for appts. Pt verbalized understanding and would like a call to set up appts:  ? ?IV venofer weekly x4 ?Labs in 3 months ?MD/ venofer 1-2 days after labs ?

## 2022-02-08 LAB — PREPARE RBC (CROSSMATCH)

## 2022-02-11 ENCOUNTER — Inpatient Hospital Stay: Payer: Medicare Other | Attending: Oncology

## 2022-02-11 VITALS — BP 130/65 | HR 72 | Temp 96.2°F | Resp 20

## 2022-02-11 DIAGNOSIS — K219 Gastro-esophageal reflux disease without esophagitis: Secondary | ICD-10-CM | POA: Diagnosis not present

## 2022-02-11 DIAGNOSIS — D509 Iron deficiency anemia, unspecified: Secondary | ICD-10-CM | POA: Diagnosis present

## 2022-02-11 DIAGNOSIS — Z882 Allergy status to sulfonamides status: Secondary | ICD-10-CM | POA: Insufficient documentation

## 2022-02-11 DIAGNOSIS — Z79899 Other long term (current) drug therapy: Secondary | ICD-10-CM | POA: Insufficient documentation

## 2022-02-11 DIAGNOSIS — Z7901 Long term (current) use of anticoagulants: Secondary | ICD-10-CM | POA: Diagnosis not present

## 2022-02-11 DIAGNOSIS — R531 Weakness: Secondary | ICD-10-CM | POA: Insufficient documentation

## 2022-02-11 DIAGNOSIS — Z885 Allergy status to narcotic agent status: Secondary | ICD-10-CM | POA: Diagnosis not present

## 2022-02-11 DIAGNOSIS — R21 Rash and other nonspecific skin eruption: Secondary | ICD-10-CM | POA: Diagnosis not present

## 2022-02-11 DIAGNOSIS — L539 Erythematous condition, unspecified: Secondary | ICD-10-CM | POA: Insufficient documentation

## 2022-02-11 DIAGNOSIS — D500 Iron deficiency anemia secondary to blood loss (chronic): Secondary | ICD-10-CM

## 2022-02-11 LAB — BPAM RBC
Blood Product Expiration Date: 202304292359
Blood Product Expiration Date: 202305012359
Blood Product Expiration Date: 202305012359
Blood Product Expiration Date: 202305032359
ISSUE DATE / TIME: 202303291747
ISSUE DATE / TIME: 202303292253
Unit Type and Rh: 5100
Unit Type and Rh: 5100
Unit Type and Rh: 5100
Unit Type and Rh: 5100

## 2022-02-11 LAB — TYPE AND SCREEN
ABO/RH(D): O POS
Antibody Screen: POSITIVE
Donor AG Type: NEGATIVE
Donor AG Type: NEGATIVE
Donor AG Type: NEGATIVE
Donor AG Type: NEGATIVE
Unit division: 0
Unit division: 0
Unit division: 0
Unit division: 0

## 2022-02-11 MED ORDER — SODIUM CHLORIDE 0.9 % IV SOLN
Freq: Once | INTRAVENOUS | Status: AC
  Filled 2022-02-11: qty 250

## 2022-02-11 MED ORDER — IRON SUCROSE 20 MG/ML IV SOLN
200.0000 mg | Freq: Once | INTRAVENOUS | Status: AC
  Administered 2022-02-11: 200 mg via INTRAVENOUS
  Filled 2022-02-11: qty 10

## 2022-02-11 MED ORDER — SODIUM CHLORIDE 0.9 % IV SOLN: 200.0000 mg | Freq: Once | INTRAVENOUS | Status: DC

## 2022-02-11 NOTE — Patient Instructions (Signed)

## 2022-02-18 ENCOUNTER — Inpatient Hospital Stay: Payer: Medicare Other

## 2022-02-18 VITALS — BP 130/72 | HR 74 | Temp 97.6°F | Resp 18

## 2022-02-18 DIAGNOSIS — D509 Iron deficiency anemia, unspecified: Secondary | ICD-10-CM | POA: Diagnosis not present

## 2022-02-18 DIAGNOSIS — D500 Iron deficiency anemia secondary to blood loss (chronic): Secondary | ICD-10-CM

## 2022-02-18 MED ORDER — SODIUM CHLORIDE 0.9 % IV SOLN
INTRAVENOUS | Status: DC | PRN
  Filled 2022-02-18: qty 250

## 2022-02-18 MED ORDER — SODIUM CHLORIDE 0.9 % IV SOLN: 200.0000 mg | Freq: Once | INTRAVENOUS | Status: DC

## 2022-02-18 MED ORDER — IRON SUCROSE 20 MG/ML IV SOLN
200.0000 mg | Freq: Once | INTRAVENOUS | Status: AC
  Administered 2022-02-18: 200 mg via INTRAVENOUS
  Filled 2022-02-18: qty 10

## 2022-02-25 ENCOUNTER — Inpatient Hospital Stay (HOSPITAL_BASED_OUTPATIENT_CLINIC_OR_DEPARTMENT_OTHER): Payer: Medicare Other | Admitting: Hospice and Palliative Medicine

## 2022-02-25 ENCOUNTER — Inpatient Hospital Stay: Payer: Medicare Other

## 2022-02-25 VITALS — BP 140/69 | HR 64 | Temp 98.1°F | Resp 20

## 2022-02-25 DIAGNOSIS — D500 Iron deficiency anemia secondary to blood loss (chronic): Secondary | ICD-10-CM | POA: Diagnosis not present

## 2022-02-25 DIAGNOSIS — D509 Iron deficiency anemia, unspecified: Secondary | ICD-10-CM | POA: Diagnosis not present

## 2022-02-25 DIAGNOSIS — R21 Rash and other nonspecific skin eruption: Secondary | ICD-10-CM

## 2022-02-25 MED ORDER — IRON SUCROSE 20 MG/ML IV SOLN
200.0000 mg | Freq: Once | INTRAVENOUS | Status: AC
  Administered 2022-02-25: 200 mg via INTRAVENOUS
  Filled 2022-02-25: qty 10

## 2022-02-25 MED ORDER — SODIUM CHLORIDE 0.9 % IV SOLN: 200.0000 mg | Freq: Once | INTRAVENOUS | Status: DC

## 2022-02-25 MED ORDER — SODIUM CHLORIDE 0.9 % IV SOLN
INTRAVENOUS | Status: DC
  Filled 2022-02-25: qty 250

## 2022-02-25 NOTE — Progress Notes (Signed)
? ?[facility]pt[facility] ?[place] at [facility] ?[phone] [phone] ? ?Patient Care Team: ?Renee Rival, NP as PCP - General (Nurse Practitioner)  ? ?Name of the patient: Kerri Steele  ?[id number]  ?1960/11/12  ? ?Date of visit: 02/25/22 ? ?Reason for Consult: ?Kerri Steele is a 61 y.o. female with multiple medical problems including iron deficiency anemia who is here for IV Venofer.  Patient was an add-on to [facility] schedule today at request of infusion nurse.  Patient reports that she developed a pruritic scalp rash after Venofer treatment last week.  She denies rashes elsewhere.  No fever or chills.  No other symptomatic complaints or concerns. ? ?Denies any neurologic complaints. Denies recent fevers or illnesses. Denies any easy bleeding or bruising. Reports good appetite and denies weight loss. Denies chest pain. Denies any nausea, vomiting, constipation, or diarrhea. Denies urinary complaints. Patient offers no further specific complaints today. ? ?PAST MEDICAL HISTORY: ?Past Medical History:  ?Diagnosis Date  ? Anginal pain ([place])   ? Anxiety   ? Asthma   ? COPD (chronic obstructive pulmonary disease) ([place])   ? Depression   ? Diabetes mellitus without complication ([place])   ? GERD (gastroesophageal reflux disease)   ? Has artificial bladder   ? Hypertension   ? Hypothyroidism   ? Pneumonia   ? Shortness of breath dyspnea   ? Sleep apnea   ? ? ?PAST SURGICAL HISTORY:  ?Past Surgical History:  ?Procedure Laterality Date  ? ANKLE SURGERY    ? BLADDER SURGERY    ? CARDIAC CATHETERIZATION Left 08/23/2015  ? Procedure: Left Heart Cath and Coronary Angiography;  Surgeon: Teodoro Spray, MD;  Location: [place] CV LAB;  Service: Cardiovascular;  Laterality: Left;  ? COLONOSCOPY WITH PROPOFOL N/A 05/20/2021  ? Procedure: COLONOSCOPY WITH PROPOFOL;  Surgeon: Lucilla Lame, MD;  Location: [facility] ENDOSCOPY;  Service: Endoscopy;  Laterality: N/A;  ? ESOPHAGEAL DILATION     ? ESOPHAGOGASTRODUODENOSCOPY (EGD) WITH PROPOFOL N/A 05/20/2021  ? Procedure: ESOPHAGOGASTRODUODENOSCOPY (EGD) WITH PROPOFOL;  Surgeon: Lucilla Lame, MD;  Location[facility] ENDOSCOPY;  Service: Endoscopy;  Laterality: N/A;  ? LOOP RECORDER INSERTION N/A 06/01/2020  ? Procedure: LOOP RECORDER INSERTION;  Surgeon: Isaias Cowman, MD;  Location: [place] CV LAB;  Service: Cardiovascular;  Laterality: N/A;  ? TEE WITHOUT CARDIOVERSION N/A 06/01/2020  ? Procedure: TRANSESOPHAGEAL ECHOCARDIOGRAM (TEE);  Surgeon: Corey Skains, MD;  Location: [facility] ORS;  Service: Cardiovascular;  Laterality: N/A;  ? TUBAL LIGATION    ? WRIST SURGERY    ? ? ?HEMATOLOGY/ONCOLOGY HISTORY:  ?Oncology History  ? No history exists.  ? ? ?ALLERGIES:  is allergic to metformin and related, morphine and related, other, and sulfa antibiotics. ? ?MEDICATIONS:  ?Current Outpatient Medications  ?Medication Sig Dispense Refill  ? clopidogrel (PLAVIX) 75 MG tablet Take 75 mg by mouth daily.    ? DULoxetine (CYMBALTA) 60 MG capsule Take 60 mg by mouth daily.    ? Dupilumab (DUPIXENT [place]) Inject 1 Dose into the skin every 14 (fourteen) days.    ? ELIQUIS 5 MG TABS tablet Take 2.5 mg by mouth 2 (two) times daily.    ? furosemide (LASIX) 20 MG tablet Take 20 mg by mouth daily.    ? glipiZIDE (GLUCOTROL XL) 10 MG 24 hr tablet Take 10 mg by mouth daily with breakfast.    ? Ipratropium-Albuterol (COMBIVENT RESPIMAT) 20-100 MCG/ACT AERS respimat Inhale 1 puff into the lungs 4 (four) times daily.    ?  magnesium oxide (MAG-OX) 400 MG tablet Take 400 mg by mouth 2 (two) times daily.     ? methocarbamol (ROBAXIN) 750 MG tablet Take 750 mg by mouth 3 (three) times daily.     ? pantoprazole (PROTONIX) 40 MG tablet Take 40 mg by mouth 2 (two) times daily.    ? pregabalin (LYRICA) 150 MG capsule Take 150 mg by mouth 3 (three) times daily.    ? traMADol (ULTRAM) 50 MG tablet Take 100 mg by mouth 2 (two) times daily.    ? verapamil (CALAN-SR) 120 MG CR tablet Take  120 mg by mouth 2 (two) times daily. Pt reports taking twice a day    ? ?No current facility-administered medications for this visit.  ? ?Facility-Administered Medications Ordered in Other Visits  ?Medication Dose Route Frequency Provider Last Rate Last Admin  ? 0.9 %  sodium chloride infusion   Intravenous Continuous Earlie Server, MD   Stopped at 02/25/22 1515  ? ? ?VITAL SIGNS: ?LMP 07/30/2015 Comment: tubal ligation ?There were no vitals filed for this visit.  ?Estimated body mass index is 31.05 kg/m? as calculated from the following: ?  Height as of 02/06/22: 5' (1.524 m). ?  Weight as of 02/06/22: 159 lb (72.1 kg). ? ?LABS: ?CBC: ?   ?Component Value Date/Time  ? WBC 10.0 02/06/2022 1425  ? HGB 8.6 (L) 02/07/2022 0729  ? HGB 13.1 08/26/2012 1011  ? HCT 29.3 (L) 02/07/2022 0729  ? HCT 39.8 08/26/2012 1011  ? PLT 525 (H) 02/06/2022 1425  ? PLT 248 08/26/2012 1011  ? MCV 68.1 (L) 02/06/2022 1425  ? MCV 84 08/26/2012 1011  ? NEUTROABS 6.9 02/06/2022 1425  ? NEUTROABS 5.5 08/26/2012 1011  ? LYMPHSABS 2.0 02/06/2022 1425  ? LYMPHSABS 1.7 08/26/2012 1011  ? MONOABS 0.8 02/06/2022 1425  ? MONOABS 0.7 08/26/2012 1011  ? EOSABS 0.2 02/06/2022 1425  ? EOSABS 0.2 08/26/2012 1011  ? BASOSABS 0.1 02/06/2022 1425  ? BASOSABS 0.0 08/26/2012 1011  ? ?Comprehensive Metabolic Panel: ?   ?Component Value Date/Time  ? NA 138 02/06/2022 1425  ? NA 141 03/10/2012 0251  ? K 3.5 02/06/2022 1425  ? K 3.8 03/10/2012 0251  ? CL 100 02/06/2022 1425  ? CL 106 03/10/2012 0251  ? CO2 29 02/06/2022 1425  ? CO2 31 03/10/2012 0251  ? BUN 15 02/06/2022 1425  ? BUN 21 (H) 03/10/2012 0251  ? CREATININE 0.93 02/06/2022 1425  ? CREATININE 0.92 08/26/2012 1011  ? GLUCOSE 240 (H) 02/06/2022 1425  ? GLUCOSE 94 03/10/2012 0251  ? CALCIUM 9.2 02/06/2022 1425  ? CALCIUM 9.0 03/10/2012 0251  ? AST 19 02/06/2022 1425  ? AST 14 (L) 03/10/2012 0251  ? ALT 16 02/06/2022 1425  ? ALT 31 03/10/2012 0251  ? ALKPHOS 92 02/06/2022 1425  ? ALKPHOS 85 03/10/2012 0251  ?  BILITOT 0.6 02/06/2022 1425  ? BILITOT 0.2 03/10/2012 0251  ? PROT 7.0 02/06/2022 1425  ? PROT 6.5 03/10/2012 0251  ? ALBUMIN 4.0 02/06/2022 1425  ? ALBUMIN 3.1 (L) 03/10/2012 0251  ? ? ?RADIOGRAPHIC STUDIES: ?No results found. ? ?PERFORMANCE STATUS (ECOG) : 1 - Symptomatic but completely ambulatory ? ?Review of Systems ?Unless otherwise noted, a complete review of systems is negative. ? ?Physical Exam ?General: NAD ?Cardiovascular: regular rate and rhythm ?Pulmonary: clear ant fields ?Abdomen: soft, nontender, + bowel sounds ?GU: no suprapubic tenderness ?Extremities: no edema, no joint deformities ?Skin: small nonpustular, erythematous bumps along scalp/follicles ?Neurological: Weakness but otherwise  nonfocal ? ?Assessment and Plan- Patient is a 61 y.o. female iron deficiency anemia who is here for IV Venofer.  Patient was an add-on to [facility] schedule today at request of infusion nurse.  Patient reports that she developed a pruritic scalp rash after Venofer treatment last week.  She denies rashes elsewhere.  No fever or chills.  No other symptomatic complaints or concerns. ?  ?Rash -unclear if this is related to Venofer.  Rash is described as pruritic and patient reports that it is relieved with diphenhydramine.  Discussed with Dr. Tasia Catchings patient encouraged to continue antihistamines.  Could consider short course of steroids if needed following Venofer.  Dr. Tasia Catchings would like patient to proceed with Venofer infusion today.  Patient to follow-up as needed ? ? ?Patient expressed understanding and was in agreement with this plan. She also understands that She can call clinic at any time with any questions, concerns, or complaints.  ? ?Thank you for allowing me to participate in the care of this very pleasant patient.  ? ?Time Total: 15 minutes ? ?Visit consisted of counseling and education dealing with the complex and emotionally intense issues of symptom management in the setting of serious illness.Greater than 50%  of this  time was spent counseling and coordinating care related to the above assessment and plan. ? ?Signed by: ?Altha Harm, PhD, NP-C ? ? ? ? ?

## 2022-02-25 NOTE — Progress Notes (Signed)
Pt reports that after last venofer infusion on 4/10 that her head and face started itching. Josh Borders NP chairside to evaluate patient prior to today's infusion. Tolerated today's venofer well. Stable at discharge ?

## 2022-03-05 ENCOUNTER — Telehealth: Payer: Self-pay | Admitting: *Deleted

## 2022-03-05 ENCOUNTER — Inpatient Hospital Stay: Payer: Medicare Other

## 2022-03-05 NOTE — Telephone Encounter (Signed)
Patient called asking to reschedule her appointment for today ?

## 2022-03-15 ENCOUNTER — Inpatient Hospital Stay: Payer: Medicare Other | Attending: Oncology

## 2022-03-15 VITALS — BP 129/68 | HR 55 | Temp 97.6°F | Resp 17

## 2022-03-15 DIAGNOSIS — D509 Iron deficiency anemia, unspecified: Secondary | ICD-10-CM | POA: Insufficient documentation

## 2022-03-15 DIAGNOSIS — R21 Rash and other nonspecific skin eruption: Secondary | ICD-10-CM | POA: Insufficient documentation

## 2022-03-15 DIAGNOSIS — D500 Iron deficiency anemia secondary to blood loss (chronic): Secondary | ICD-10-CM

## 2022-03-15 DIAGNOSIS — Z79899 Other long term (current) drug therapy: Secondary | ICD-10-CM | POA: Insufficient documentation

## 2022-03-15 MED ORDER — SODIUM CHLORIDE 0.9 % IV SOLN
Freq: Once | INTRAVENOUS | Status: AC
  Filled 2022-03-15: qty 250

## 2022-03-15 MED ORDER — IRON SUCROSE 20 MG/ML IV SOLN
200.0000 mg | Freq: Once | INTRAVENOUS | Status: AC
  Administered 2022-03-15: 200 mg via INTRAVENOUS
  Filled 2022-03-15: qty 10

## 2022-03-15 MED ORDER — SODIUM CHLORIDE 0.9 % IV SOLN: 200.0000 mg | Freq: Once | INTRAVENOUS | Status: DC

## 2022-03-15 NOTE — Patient Instructions (Signed)

## 2022-05-10 ENCOUNTER — Inpatient Hospital Stay: Payer: Medicare Other | Attending: Oncology

## 2022-05-10 DIAGNOSIS — D509 Iron deficiency anemia, unspecified: Secondary | ICD-10-CM | POA: Insufficient documentation

## 2022-05-10 DIAGNOSIS — D500 Iron deficiency anemia secondary to blood loss (chronic): Secondary | ICD-10-CM

## 2022-05-10 DIAGNOSIS — Z7901 Long term (current) use of anticoagulants: Secondary | ICD-10-CM

## 2022-05-10 LAB — CBC WITH DIFFERENTIAL/PLATELET
Abs Immature Granulocytes: 0.02 10*3/uL (ref 0.00–0.07)
Basophils Absolute: 0.1 10*3/uL (ref 0.0–0.1)
Basophils Relative: 1 %
Eosinophils Absolute: 0.2 10*3/uL (ref 0.0–0.5)
Eosinophils Relative: 3 %
HCT: 42.5 % (ref 36.0–46.0)
Hemoglobin: 13.3 g/dL (ref 12.0–15.0)
Immature Granulocytes: 0 %
Lymphocytes Relative: 28 %
Lymphs Abs: 2.4 10*3/uL (ref 0.7–4.0)
MCH: 25.1 pg — ABNORMAL LOW (ref 26.0–34.0)
MCHC: 31.3 g/dL (ref 30.0–36.0)
MCV: 80.2 fL (ref 80.0–100.0)
Monocytes Absolute: 0.7 10*3/uL (ref 0.1–1.0)
Monocytes Relative: 8 %
Neutro Abs: 5.2 10*3/uL (ref 1.7–7.7)
Neutrophils Relative %: 60 %
Platelets: 289 10*3/uL (ref 150–400)
RBC: 5.3 MIL/uL — ABNORMAL HIGH (ref 3.87–5.11)
RDW: 20.7 % — ABNORMAL HIGH (ref 11.5–15.5)
WBC: 8.6 10*3/uL (ref 4.0–10.5)
nRBC: 0 % (ref 0.0–0.2)

## 2022-05-10 LAB — IRON AND TIBC
Iron: 64 ug/dL (ref 28–170)
Saturation Ratios: 19 % (ref 10.4–31.8)
TIBC: 337 ug/dL (ref 250–450)
UIBC: 273 ug/dL

## 2022-05-10 LAB — FERRITIN: Ferritin: 61 ng/mL (ref 11–307)

## 2022-05-15 ENCOUNTER — Encounter: Payer: Self-pay | Admitting: Oncology

## 2022-05-15 ENCOUNTER — Inpatient Hospital Stay: Payer: Medicare Other | Attending: Oncology | Admitting: Oncology

## 2022-05-15 ENCOUNTER — Inpatient Hospital Stay: Payer: Medicare Other

## 2022-05-15 VITALS — BP 123/83 | HR 75 | Temp 97.8°F | Resp 18 | Wt 155.8 lb

## 2022-05-15 DIAGNOSIS — Z87891 Personal history of nicotine dependence: Secondary | ICD-10-CM | POA: Diagnosis not present

## 2022-05-15 DIAGNOSIS — Z882 Allergy status to sulfonamides status: Secondary | ICD-10-CM | POA: Diagnosis not present

## 2022-05-15 DIAGNOSIS — I4891 Unspecified atrial fibrillation: Secondary | ICD-10-CM | POA: Diagnosis not present

## 2022-05-15 DIAGNOSIS — Z806 Family history of leukemia: Secondary | ICD-10-CM | POA: Diagnosis not present

## 2022-05-15 DIAGNOSIS — Z7901 Long term (current) use of anticoagulants: Secondary | ICD-10-CM | POA: Diagnosis not present

## 2022-05-15 DIAGNOSIS — Z832 Family history of diseases of the blood and blood-forming organs and certain disorders involving the immune mechanism: Secondary | ICD-10-CM | POA: Diagnosis not present

## 2022-05-15 DIAGNOSIS — R42 Dizziness and giddiness: Secondary | ICD-10-CM | POA: Insufficient documentation

## 2022-05-15 DIAGNOSIS — J441 Chronic obstructive pulmonary disease with (acute) exacerbation: Secondary | ICD-10-CM | POA: Insufficient documentation

## 2022-05-15 DIAGNOSIS — Z8249 Family history of ischemic heart disease and other diseases of the circulatory system: Secondary | ICD-10-CM | POA: Insufficient documentation

## 2022-05-15 DIAGNOSIS — Z833 Family history of diabetes mellitus: Secondary | ICD-10-CM | POA: Diagnosis not present

## 2022-05-15 DIAGNOSIS — Z885 Allergy status to narcotic agent status: Secondary | ICD-10-CM | POA: Diagnosis not present

## 2022-05-15 DIAGNOSIS — D500 Iron deficiency anemia secondary to blood loss (chronic): Secondary | ICD-10-CM

## 2022-05-15 DIAGNOSIS — D509 Iron deficiency anemia, unspecified: Secondary | ICD-10-CM | POA: Diagnosis present

## 2022-05-15 DIAGNOSIS — Z79899 Other long term (current) drug therapy: Secondary | ICD-10-CM | POA: Diagnosis not present

## 2022-05-15 MED FILL — Iron Sucrose Inj 20 MG/ML (Fe Equiv): INTRAVENOUS | Qty: 10 | Status: AC

## 2022-05-15 NOTE — Progress Notes (Signed)
Hematology/Oncology Progress note [phone] [phone]      Patient Care Team: Erasmo Downer, NP as PCP - General (Nurse Practitioner)  REFERRING PROVIDER: Erasmo Downer, NP CHIEF COMPLAINTS/REASON FOR VISIT:  iron deficiency anemia  HISTORY OF PRESENTING ILLNESS:  Kerri Steele is a  61 y.o.  female with PMH listed below was seen in consultation at the request of Erasmo Downer, NP   for evaluation of iron deficiency anemia.   05/07/2021, patient received 1 unit of PRBC transfusion due to hemoglobin less than 7. 05/19/2021 - 05/20/2021 patient presented emergency room for evaluation of dizziness and lightheadedness.  Hemoglobin was 7.1.  Patient received another unit of PRBC. Underwent EGD and colonoscopy without significant findings to explain her iron deficiency anemia.  No active bleeding spot or ulcer discovered. Small bowel capsule study was arranged with gastroenterology in September 2022.  Patient has a history of atrial fibrillation and has been on Eliquis 2.5 mg twice daily.  Patient also had acute on chronic COPD exacerbation.  Was treated with a course of prednisone. Today she reports ongoing feeling of dizziness.  Lightheaded, fatigue.  Denies any black or bloody stool.  INTERVAL HISTORY Kerri Steele is a 61 y.o. female who has above history reviewed by me today presents for follow up visit for management of iron deficiency anemia  She previously tolerated IV Venofer treatments very well. She currently takes Eliquis 2.5 mg twice daily.  Has been off aspirin and Plavix.  No active bleeding events.  Fatigue level has improved  Review of Systems  Constitutional:  Negative for appetite change, chills, fatigue and fever.  HENT:   Negative for hearing loss and voice change.   Eyes:  Negative for eye problems.  Respiratory:  Negative for chest tightness and cough.   Cardiovascular:  Negative for chest pain.  Gastrointestinal:  Negative for  abdominal distention, abdominal pain and blood in stool.  Endocrine: Negative for hot flashes.  Genitourinary:  Negative for difficulty urinating and frequency.   Musculoskeletal:  Negative for arthralgias.  Skin:  Negative for itching and rash.  Neurological:  Negative for dizziness, extremity weakness and light-headedness.  Hematological:  Negative for adenopathy.  Psychiatric/Behavioral:  Negative for confusion.    MEDICAL HISTORY:  Past Medical History:  Diagnosis Date   Anginal pain (H[facility])    Anxiety    Asthma    COPD (chronic obstructive pulmonary disease) (H[facility])    Depression    Diabetes mellitus without complication (H[facility])    GERD (gastroesophageal reflux disease)    Has artificial bladder    Hypertension    Hypothyroidism    Pneumonia    Shortness of breath dyspnea    Sleep apnea     SURGICAL HISTORY: Past Surgical History:  Procedure Laterality Date   ANKLE SURGERY     BLADDER SURGERY     CARDIAC CATHETERIZATION Left 08/23/2015   Procedure: Left Heart Cath and Coronary Angiography;  Surgeon: Dalia Heading, MD;  Location: [facility] INVASIVE CV LAB;  Service: Cardiovascular;  Laterality: Left;   COLONOSCOPY WITH PROPOFOL N/A 05/20/2021   Procedure: COLONOSCOPY WITH PROPOFOL;  Surgeon: [name], MD;  Location: [facility] ENDOSCOPY;  Service: Endoscopy;  Laterality: N/A;   ESOPHAGEAL DILATION     ESOPHAGOGASTRODUODENOSCOPY (EGD) WITH PROPOFOL N/A 05/20/2021   Procedure: ESOPHAGOGASTRODUODENOSCOPY (EGD) WITH PROPOFOL;  Surgeon: [name], MD;  Location: [facility] ENDOSCOPY;  Service: Endoscopy;  Laterality: N/A;   LOOP RECORDER INSERTION N/A 06/01/2020   Procedure: LOOP RECORDER INSERTION;  Surgeon: Marcina Millard, MD;  Location: [facility] INVASIVE CV LAB;  Service: Cardiovascular;  Laterality: N/A;   TEE WITHOUT CARDIOVERSION N/A 06/01/2020   Procedure: TRANSESOPHAGEAL ECHOCARDIOGRAM (TEE);  Surgeon: Lamar Blinks, MD;  Location: [facility] ORS;  Service: Cardiovascular;  Laterality:  N/A;   TUBAL LIGATION     WRIST SURGERY      SOCIAL HISTORY: Social History   Socioeconomic History   Marital status: Married    Spouse name: Not on file   Number of children: Not on file   Years of education: Not on file   Highest education level: Not on file  Occupational History   Not on file  Tobacco Use   Smoking status: Former    Packs/day: 2.00    Years: 35.00    Total pack years: 70.00    Types: Cigarettes    Quit date: 01/25/2010    Years since quitting: 12.3   Smokeless tobacco: Never  Vaping Use   Vaping Use: Never used  Substance and Sexual Activity   Alcohol use: Not Currently    Comment: quit 6 years ago   Drug use: Yes    Comment: prescribed tramadol, oxycodone,and robaxin   Sexual activity: Not on file  Other Topics Concern   Not on file  Social History Narrative   Not on file   Social Determinants of Health   Financial Resource Strain: Not on file  Food Insecurity: Not on file  Transportation Needs: Not on file  Physical Activity: Not on file  Stress: Not on file  Social Connections: Not on file  Intimate Partner Violence: Not on file    FAMILY HISTORY: Family History  Problem Relation Age of Onset   Hypertension Mother    Diabetes Mother    Lupus Mother    Atrial fibrillation Mother    Heart disease Father    Acute myelogenous leukemia Nephew     ALLERGIES:  is allergic to metformin and related, morphine and related, other, and sulfa antibiotics.  MEDICATIONS:  Current Outpatient Medications  Medication Sig Dispense Refill   atorvastatin (LIPITOR) 40 MG tablet Take 40 mg by mouth daily.     b complex vitamins capsule Take 1 capsule by mouth daily.     DULoxetine (CYMBALTA) 60 MG capsule Take 60 mg by mouth daily.     Dupilumab (DUPIXENT [place]) Inject 1 Dose into the skin every 14 (fourteen) days.     ELIQUIS 5 MG TABS tablet Take 2.5 mg by mouth 2 (two) times daily.     furosemide (LASIX) 20 MG tablet Take 20 mg by mouth daily.      glipiZIDE (GLUCOTROL XL) 10 MG 24 hr tablet Take 10 mg by mouth daily with breakfast.     Ipratropium-Albuterol (COMBIVENT RESPIMAT) 20-100 MCG/ACT AERS respimat Inhale 1 puff into the lungs 4 (four) times daily.     magnesium oxide (MAG-OX) 400 MG tablet Take 400 mg by mouth 2 (two) times daily.      meclizine (ANTIVERT) 25 MG tablet Take by mouth.     methocarbamol (ROBAXIN) 750 MG tablet Take 750 mg by mouth 3 (three) times daily.      pantoprazole (PROTONIX) 40 MG tablet Take 40 mg by mouth 2 (two) times daily.     pregabalin (LYRICA) 150 MG capsule Take 150 mg by mouth 3 (three) times daily.     traMADol (ULTRAM) 50 MG tablet Take 100 mg by mouth 2 (two) times daily.     verapamil (CALAN-SR) 120  MG CR tablet Take 120 mg by mouth 2 (two) times daily. Pt reports taking twice a day     No current facility-administered medications for this visit.     PHYSICAL EXAMINATION: ECOG PERFORMANCE STATUS: 0 - Asymptomatic Vitals:   05/15/22 1320  BP: 123/83  Pulse: 75  Resp: 18  Temp: 97.8 F (36.6 C)   Filed Weights   05/15/22 1320  Weight: 155 lb 12.8 oz (70.7 kg)    Physical Exam Constitutional:      General: She is not in acute distress. HENT:     Head: Normocephalic and atraumatic.  Eyes:     General: No scleral icterus. Cardiovascular:     Rate and Rhythm: Normal rate and regular rhythm.  Pulmonary:     Effort: Pulmonary effort is normal. No respiratory distress.     Breath sounds: No wheezing.  Abdominal:     General: There is no distension.  Musculoskeletal:        General: No deformity. Normal range of motion.     Cervical back: Normal range of motion and neck supple.  Skin:    General: Skin is warm and dry.     Findings: No erythema or rash.  Neurological:     Mental Status: She is alert and oriented to person, place, and time. Mental status is at baseline.     Cranial Nerves: No cranial nerve deficit.     Coordination: Coordination normal.  Psychiatric:         Mood and Affect: Mood normal.           LABORATORY DATA:  I have reviewed the data as listed Lab Results  Component Value Date   WBC 8.6 05/10/2022   HGB 13.3 05/10/2022   HCT 42.5 05/10/2022   MCV 80.2 05/10/2022   PLT 289 05/10/2022   Recent Labs    05/20/21 0436 08/30/21 1930 08/30/21 1938 02/06/22 1425  NA 142 139 142 138  K 3.5 3.6 3.6 3.5  CL 109 102 102 100  CO2 27 28  --  29  GLUCOSE 108* 157* 149* 240*  BUN 16 11 11 15   CREATININE 0.76 0.92 0.90 0.93  CALCIUM 9.3 9.9  --  9.2  GFRNONAA >60 >60  --  >60  PROT  --  6.9  --  7.0  ALBUMIN  --  4.0  --  4.0  AST  --  29  --  19  ALT  --  33  --  16  ALKPHOS  --  89  --  92  BILITOT  --  0.6  --  0.6    Iron/TIBC/Ferritin/ %Sat    Component Value Date/Time   IRON 64 05/10/2022 1312   IRON 63 08/26/2012 1011   TIBC 337 05/10/2022 1312   TIBC 330 08/26/2012 1011   FERRITIN 61 05/10/2022 1312   FERRITIN 3 (L) 06/16/2012 0825   IRONPCTSAT 19 05/10/2022 1312   IRONPCTSAT 19 08/26/2012 1011      RADIOGRAPHIC STUDIES: I have personally reviewed the radiological images as listed and agreed with the findings in the report. No results found.      ASSESSMENT & PLAN:  1. Iron deficiency anemia due to chronic blood loss    #Iron deficiency anemia, Labs reviewed and discussed with patient .  Hemoglobin and iron panel both normalized .  Hold off additional IV iron treatments for now. She has had EGD and colonoscopy.  She declined capsule study due to high co-pay.  Recommend to monitor clinically.  Follow-up in 6 months.  All questions were answered. The patient knows to call the clinic with any problems questions or concerns.  Cc Erasmo Downer, NP    Rickard Patience, MD, PhD Hematology Oncology  05/15/2022

## 2022-11-15 ENCOUNTER — Inpatient Hospital Stay: Payer: Medicare Other

## 2022-11-18 ENCOUNTER — Ambulatory Visit: Payer: Medicare Other

## 2022-11-18 ENCOUNTER — Ambulatory Visit: Payer: Medicare Other | Admitting: Oncology

## 2023-02-08 ENCOUNTER — Emergency Department
Admission: EM | Admit: 2023-02-08 | Discharge: 2023-02-08 | Disposition: A | Discharge status: Home / Self Care | Payer: Medicare Other | Attending: Emergency Medicine | Admitting: Emergency Medicine

## 2023-02-08 ENCOUNTER — Other Ambulatory Visit: Payer: Self-pay

## 2023-02-08 ENCOUNTER — Encounter: Payer: Self-pay | Admitting: Emergency Medicine

## 2023-02-08 ENCOUNTER — Emergency Department: Payer: Medicare Other

## 2023-02-08 DIAGNOSIS — S92421A Displaced fracture of distal phalanx of right great toe, initial encounter for closed fracture: Secondary | ICD-10-CM | POA: Diagnosis not present

## 2023-02-08 DIAGNOSIS — W19XXXA Unspecified fall, initial encounter: Secondary | ICD-10-CM | POA: Insufficient documentation

## 2023-02-08 DIAGNOSIS — S99921A Unspecified injury of right foot, initial encounter: Secondary | ICD-10-CM | POA: Diagnosis present

## 2023-02-08 MED ORDER — MELOXICAM 15 MG PO TABS: 15.0000 mg | ORAL_TABLET | Freq: Every day | ORAL | 0 refills | Status: DC

## 2023-02-08 NOTE — ED Triage Notes (Signed)
Patient to ED via POV after a fall yesterday at a store. Patient c/o right toe pain- big toe. Bruising noted but ambulatory.

## 2023-02-08 NOTE — ED Provider Notes (Signed)
[facility] Provider Note  Patient Contact: 5:25 PM (approximate)   History   Fall (/)   HPI  Kerri Steele is a 62 y.o. female who presents emergency department complaining of right great toe pain, ecchymosis.  Patient states that she had a fall yesterday.  Unsure the exact mechanism but her injury after this fall was to her great toe.  Patient has had some bruising and swelling to the toe.  She states that she is having to walk on the side of her foot or to the heel to keep pressure off the toe.  No open wounds.  No other complaints.     Physical Exam   Triage Vital Signs: ED Triage Vitals  Enc Vitals Group     BP 02/08/23 1536 124/78     Pulse Rate 02/08/23 1536 72     Resp 02/08/23 1536 18     Temp 02/08/23 1535 97.9 F (36.6 C)     Temp Source 02/08/23 1535 Oral     SpO2 02/08/23 1536 95 %     Weight --      Height --      Head Circumference --      Peak Flow --      Pain Score 02/08/23 1535 10     Pain Loc --      Pain Edu? --      Excl. in [place]? --     Most recent vital signs: Vitals:   02/08/23 1535 02/08/23 1536  BP:  124/78  Pulse:  72  Resp:  18  Temp: 97.9 F (36.6 C)   SpO2:  95%     General: Alert and in no acute distress.  Cardiovascular:  Good peripheral perfusion Respiratory: Normal respiratory effort without tachypnea or retractions. Lungs CTAB.  Musculoskeletal: Full range of motion to all extremities.  Visualization of the great toe right foot reveals ecchymosis and slight edema of the great toe when compared to left.  There is no evidence of significant subungual hematoma.  There is no open wound.  Very tender to palpation over the distal phalanx.  No palpable abnormality. Neurologic:  No gross focal neurologic deficits are appreciated.  Skin:   No rash noted Other:   ED Results / Procedures / Treatments   Labs (all labs ordered are listed, but only abnormal results are displayed) Labs Reviewed - No data to  display   EKG     RADIOLOGY  I personally viewed, evaluated, and interpreted these images as part of my medical decision making, as well as reviewing the written report by the radiologist.  ED Provider Interpretation: Small avulsion/chip fracture of the very distal tuft of the distal phalanx of the great toe right foot.  DG Toe Great Right  Result Date: 02/08/2023 CLINICAL DATA:  [place] yesterday, bruising and pain of right great toe EXAM: RIGHT GREAT TOE COMPARISON:  None Available. FINDINGS: Frontal, oblique, and lateral views of the right great toe are obtained. There is a small minimally displaced fracture dorsal aspect distal tuft of the first distal phalanx, only well seen on the lateral view. Overlying soft tissue swelling. No other acute bony abnormalities. Joint spaces are well preserved. IMPRESSION: 1. Small chip type fracture dorsal aspect distal tuft first distal phalanx. Electronically Signed   By: Randa Ngo M.D.   On: 02/08/2023 16:34    PROCEDURES:  Critical Care performed: No  Procedures   MEDICATIONS ORDERED IN ED: Medications - No data to  display   IMPRESSION / MDM / ASSESSMENT AND PLAN / ED COURSE  I reviewed the triage vital signs and the nursing notes.                                 Differential diagnosis includes, but is not limited to, toe fracture, toe dislocation, subungual hematoma, contusion  Patient's presentation is most consistent with acute presentation with potential threat to life or bodily function.   Patient's diagnosis is consistent with distal phalanx fracture of the great toe right foot.  Patient has a injury to the great toe.  Small chip/avulsion type fracture off the distal phalanx was observed on x-ray.  Patiently placed in a postop shoe.  Anti-inflammatory for pain.  Follow-up with primary care as needed.. Patient is given ED precautions to return to the ED for any worsening or new symptoms.     FINAL CLINICAL IMPRESSION(S) /  ED DIAGNOSES   Final diagnoses:  Closed displaced fracture of distal phalanx of right great toe, initial encounter     Rx / DC Orders   ED Discharge Orders          Ordered    meloxicam (MOBIC) 15 MG tablet  Daily        02/08/23 1727             Note:  This document was prepared using Dragon voice recognition software and may include unintentional dictation errors.   Brynda Peon 02/08/23 1728    Blake Divine, MD 02/08/23 [phone]

## 2023-09-17 ENCOUNTER — Encounter: Payer: Self-pay | Admitting: Cardiology

## 2023-09-17 ENCOUNTER — Ambulatory Visit
Admission: RE | Admit: 2023-09-17 | Discharge: 2023-09-17 | Disposition: A | Discharge status: Home / Self Care | Payer: Medicare Other | Attending: Cardiology | Admitting: Cardiology

## 2023-09-17 ENCOUNTER — Encounter
Admission: RE | Disposition: A | Discharge status: Home / Self Care | Payer: Self-pay | Source: Home / Self Care | Attending: Cardiology

## 2023-09-17 DIAGNOSIS — I480 Paroxysmal atrial fibrillation: Secondary | ICD-10-CM | POA: Diagnosis present

## 2023-09-17 HISTORY — PX: LOOP RECORDER REMOVAL: EP1215

## 2023-09-17 SURGERY — LOOP RECORDER REMOVAL
Anesthesia: Moderate Sedation

## 2023-09-17 MED ORDER — LIDOCAINE-EPINEPHRINE (PF) 1 %-1:200000 IJ SOLN
INTRAMUSCULAR | Status: AC
  Filled 2023-09-17: qty 30

## 2023-09-17 MED ORDER — LIDOCAINE-EPINEPHRINE (PF) 1 %-1:200000 IJ SOLN
INTRAMUSCULAR | Status: DC | PRN
  Administered 2023-09-17: 10 mL

## 2023-09-17 SURGICAL SUPPLY — 1 items: PACK LOOP INSERTION (CUSTOM PROCEDURE TRAY) IMPLANT

## 2023-09-17 NOTE — Discharge Instructions (Signed)
Patient may shower 09/19/2023.  Leave Steri-Strips on.

## 2023-10-20 ENCOUNTER — Emergency Department: Payer: Medicare Other

## 2023-10-20 ENCOUNTER — Emergency Department
Admission: EM | Admit: 2023-10-20 | Discharge: 2023-10-20 | Disposition: A | Discharge status: Home / Self Care | Payer: Medicare Other | Attending: Emergency Medicine | Admitting: Emergency Medicine

## 2023-10-20 ENCOUNTER — Other Ambulatory Visit: Payer: Self-pay

## 2023-10-20 DIAGNOSIS — R197 Diarrhea, unspecified: Secondary | ICD-10-CM | POA: Diagnosis not present

## 2023-10-20 DIAGNOSIS — E119 Type 2 diabetes mellitus without complications: Secondary | ICD-10-CM | POA: Diagnosis not present

## 2023-10-20 DIAGNOSIS — R1084 Generalized abdominal pain: Secondary | ICD-10-CM | POA: Diagnosis not present

## 2023-10-20 DIAGNOSIS — R109 Unspecified abdominal pain: Secondary | ICD-10-CM | POA: Diagnosis present

## 2023-10-20 DIAGNOSIS — R112 Nausea with vomiting, unspecified: Secondary | ICD-10-CM | POA: Insufficient documentation

## 2023-10-20 LAB — CBC WITH DIFFERENTIAL/PLATELET
Abs Immature Granulocytes: 0.05 10*3/uL (ref 0.00–0.07)
Basophils Absolute: 0 10*3/uL (ref 0.0–0.1)
Basophils Relative: 0 %
Eosinophils Absolute: 0.1 10*3/uL (ref 0.0–0.5)
Eosinophils Relative: 0 %
HCT: 42 % (ref 36.0–46.0)
Hemoglobin: 13.4 g/dL (ref 12.0–15.0)
Immature Granulocytes: 0 %
Lymphocytes Relative: 13 %
Lymphs Abs: 1.9 10*3/uL (ref 0.7–4.0)
MCH: 25.1 pg — ABNORMAL LOW (ref 26.0–34.0)
MCHC: 31.9 g/dL (ref 30.0–36.0)
MCV: 78.8 fL — ABNORMAL LOW (ref 80.0–100.0)
Monocytes Absolute: 0.9 10*3/uL (ref 0.1–1.0)
Monocytes Relative: 7 %
Neutro Abs: 10.9 10*3/uL — ABNORMAL HIGH (ref 1.7–7.7)
Neutrophils Relative %: 80 %
Platelets: 368 10*3/uL (ref 150–400)
RBC: 5.33 MIL/uL — ABNORMAL HIGH (ref 3.87–5.11)
RDW: 15.9 % — ABNORMAL HIGH (ref 11.5–15.5)
WBC: 13.8 10*3/uL — ABNORMAL HIGH (ref 4.0–10.5)
nRBC: 0 % (ref 0.0–0.2)

## 2023-10-20 LAB — COMPREHENSIVE METABOLIC PANEL
ALT: 33 U/L (ref 0–44)
AST: 25 U/L (ref 15–41)
Albumin: 3.6 g/dL (ref 3.5–5.0)
Alkaline Phosphatase: 71 U/L (ref 38–126)
Anion gap: 13 (ref 5–15)
BUN: 22 mg/dL (ref 8–23)
CO2: 22 mmol/L (ref 22–32)
Calcium: 9.3 mg/dL (ref 8.9–10.3)
Chloride: 102 mmol/L (ref 98–111)
Creatinine, Ser: 0.98 mg/dL (ref 0.44–1.00)
GFR, Estimated: >60 mL/min (ref >60)
Glucose, Bld: 210 mg/dL — ABNORMAL HIGH (ref 70–99)
Potassium: 3.4 mmol/L — ABNORMAL LOW (ref 3.5–5.1)
Sodium: 137 mmol/L (ref 135–145)
Total Bilirubin: 0.5 mg/dL (ref <1.2)
Total Protein: 7 g/dL (ref 6.5–8.1)

## 2023-10-20 LAB — LIPASE, BLOOD: Lipase: 21 U/L (ref 11–51)

## 2023-10-20 MED ORDER — DICYCLOMINE HCL 10 MG PO CAPS: 10.0000 mg | ORAL_CAPSULE | Freq: Three times a day (TID) | ORAL | 0 refills | Status: DC | PRN

## 2023-10-20 MED ORDER — ONDANSETRON 4 MG PO TBDP
4.0000 mg | ORAL_TABLET | Freq: Once | ORAL | Status: AC
  Administered 2023-10-20: 4 mg via ORAL
  Filled 2023-10-20: qty 1

## 2023-10-20 MED ORDER — ONDANSETRON 4 MG PO TBDP: 4.0000 mg | ORAL_TABLET | Freq: Three times a day (TID) | ORAL | 0 refills | Status: AC | PRN

## 2023-10-20 MED ORDER — IOHEXOL 300 MG/ML  SOLN
100.0000 mL | Freq: Once | INTRAMUSCULAR | Status: AC | PRN
  Administered 2023-10-20: 100 mL via INTRAVENOUS

## 2023-10-20 MED ORDER — HYDROMORPHONE HCL 1 MG/ML IJ SOLN
1.0000 mg | Freq: Once | INTRAMUSCULAR | Status: AC
  Administered 2023-10-20: 1 mg via INTRAVENOUS
  Filled 2023-10-20: qty 1

## 2023-10-20 MED ORDER — SODIUM CHLORIDE 0.9 % IV BOLUS
1000.0000 mL | Freq: Once | INTRAVENOUS | Status: AC
Start: 2023-10-20 — End: 2023-10-20
  Administered 2023-10-20: 1000 mL via INTRAVENOUS

## 2023-10-20 MED ORDER — ONDANSETRON HCL 4 MG/2ML IJ SOLN
4.0000 mg | Freq: Once | INTRAMUSCULAR | Status: AC
Start: 2023-10-20 — End: 2023-10-20
  Administered 2023-10-20: 4 mg via INTRAVENOUS
  Filled 2023-10-20: qty 2

## 2023-10-20 NOTE — Discharge Instructions (Signed)
You may take the Zofran as needed for nausea and the Bentyl as needed for abdominal pain.  Return to the ER for new, worsening, or persistent severe abdominal pain, vomiting, if you cannot hold anything food or liquid down, weakness, or any other new or worsening symptoms that concern you.

## 2023-10-20 NOTE — ED Provider Triage Note (Signed)
Emergency Medicine Provider Triage Evaluation Note  Kerri Steele , a 62 y.o. female  was evaluated in triage.  Pt complains of vomiting and diarrhea since Saturday and epigastric pain.  Review of Systems  Positive: N/V/D Negative: Fevers, urinary symptoms, constipation  Physical Exam  LMP 07/30/2015 Comment: tubal ligation Gen:   Awake, no distress   Resp:  Normal effort  MSK:   Moves extremities without difficulty  Other:    Medical Decision Making  Medically screening exam initiated at 9:27 AM.  Appropriate orders placed.  Kerri Steele was informed that the remainder of the evaluation will be completed by another provider, this initial triage assessment does not replace that evaluation, and the importance of remaining in the ED until their evaluation is complete.     Cameron Ali, PA-C 10/20/23 [phone]

## 2023-10-20 NOTE — ED Provider Notes (Signed)
[facility] Provider Note    Event Date/Time   First MD Initiated Contact with Patient 10/20/23 [phone]     (approximate)   History   Abdominal Pain   HPI  Kerri Steele is a 63 y.o. female with a history of iron deficiency anemia, SVT, stroke, and type 2 diabetes who presents with abdominal pain, vomiting, diarrhea for the last 3 days.  The patient reports primarily epigastric abdominal pain that is constant but also waxing and waning in intensity.  She denies any urinary symptoms.  She has no fever or chills.  I reviewed the past medical records.  The patient was most recently admitted to the hospitalist service in March 2023 with severe anemia requiring transfusion.   Physical Exam   Triage Vital Signs: ED Triage Vitals  Encounter Vitals Group     BP 10/20/23 0929 108/83     Systolic BP Percentile --      Diastolic BP Percentile --      Pulse Rate 10/20/23 0929 97     Resp 10/20/23 0929 18     Temp 10/20/23 0929 98 F (36.7 C)     Temp src --      SpO2 10/20/23 0929 100 %     Weight 10/20/23 0930 155 lb (70.3 kg)     Height 10/20/23 0930 5\' 4"  (1.626 m)     Head Circumference --      Peak Flow --      Pain Score 10/20/23 0930 10     Pain Loc --      Pain Education --      Exclude from Growth Chart --     Most recent vital signs: Vitals:   10/20/23 0929 10/20/23 1334  BP: 108/83 110/78  Pulse: 97 90  Resp: 18 18  Temp: 98 F (36.7 C) 98 F (36.7 C)  SpO2: 100% 100%    General: Alert, uncomfortable appearing, no distress.  CV:  Good peripheral perfusion.  Resp:  Normal effort.  Abd:  Soft with mild epigastric tenderness.  No distention.  Other:  No jaundice or scleral icterus.  Dry mucous membranes.   ED Results / Procedures / Treatments   Labs (all labs ordered are listed, but only abnormal results are displayed) Labs Reviewed  COMPREHENSIVE METABOLIC PANEL - Abnormal; Notable for the following components:      Result Value    Potassium 3.4 (*)    Glucose, Bld 210 (*)    All other components within normal limits  CBC WITH DIFFERENTIAL/PLATELET - Abnormal; Notable for the following components:   WBC 13.8 (*)    RBC 5.33 (*)    MCV 78.8 (*)    MCH 25.1 (*)    RDW 15.9 (*)    Neutro Abs 10.9 (*)    All other components within normal limits  LIPASE, BLOOD  URINALYSIS, ROUTINE W REFLEX MICROSCOPIC     EKG    RADIOLOGY  CT abdomen/pelvis: I independently viewed and interpreted the images; there are no dilated bowel loops or any free air or free fluid  IMPRESSION:  No acute findings.    Punctate right renal calculi and 3 mm calculus in urinary bladder.  No evidence of ureteral calculi or hydronephrosis.     PROCEDURES:  Critical Care performed: No  Procedures   MEDICATIONS ORDERED IN ED: Medications  ondansetron (ZOFRAN-ODT) disintegrating tablet 4 mg (4 mg Oral Given 10/20/23 0932)  sodium chloride 0.9 % bolus 1,000 mL (0  mLs Intravenous Stopped 10/20/23 1248)  ondansetron (ZOFRAN) injection 4 mg (4 mg Intravenous Given 10/20/23 1123)  HYDROmorphone (DILAUDID) injection 1 mg (1 mg Intravenous Given 10/20/23 1123)  iohexol (OMNIPAQUE) 300 MG/ML solution 100 mL (100 mLs Intravenous Contrast Given 10/20/23 1201)     IMPRESSION / MDM / ASSESSMENT AND PLAN / ED COURSE  I reviewed the triage vital signs and the nursing notes.  62 year old female with PMH as noted above presents with epigastric abdominal pain, nausea, vomiting, diarrhea over the last 3 days.  On exam the patient's vital signs are normal.  She has mild tenderness in the epigastric abdomen.  Mucous membranes are dry.  Differential diagnosis includes, but is not limited to, viral gastroenteritis, foodborne illness, colitis, diverticulitis, hepatobiliary cause.  We will obtain lab workup, CT, give fluids and analgesia and reassess.  Patient's presentation is most consistent with acute complicated illness / injury requiring diagnostic  workup.  ----------------------------------------- 2:11 PM on 10/20/2023 -----------------------------------------  Lab workup is unremarkable.  CBC shows elevated WBC count which is nonspecific.  CMP and lipase are normal.  CT abdomen shows no acute findings.  On reassessment the patient states she is feeling significantly better.  She is now able to tolerate p.o.  She feels comfortable and would like to go home.  Overall presentation is most consistent with viral gastroenteritis.  At this time the patient is stable for discharge.  I will prescribe Zofran and Bentyl.  I gave strict return precautions and she expresses understanding.    FINAL CLINICAL IMPRESSION(S) / ED DIAGNOSES   Final diagnoses:  Generalized abdominal pain  Nausea vomiting and diarrhea     Rx / DC Orders   ED Discharge Orders          Ordered    ondansetron (ZOFRAN-ODT) 4 MG disintegrating tablet  Every 8 hours PRN        10/20/23 1407    dicyclomine (BENTYL) 10 MG capsule  Every 8 hours PRN        10/20/23 1407             Note:  This document was prepared using Dragon voice recognition software and may include unintentional dictation errors.    Dionne Bucy, MD 10/20/23 [phone]

## 2023-10-20 NOTE — ED Triage Notes (Signed)
Pt comes with c/o diarrhea, belly pain and vomiting. Pt states this started few days ago. Pt states she can't keep anything down.

## 2023-10-20 NOTE — ED Notes (Signed)
See triage note  Presents with some abd pain   States started a few days ago  with some n/v  Denies any fever

## 2024-01-19 ENCOUNTER — Emergency Department

## 2024-01-19 ENCOUNTER — Emergency Department
Admission: EM | Admit: 2024-01-19 | Discharge: 2024-01-19 | Disposition: A | Discharge status: Home / Self Care | Attending: Emergency Medicine | Admitting: Emergency Medicine

## 2024-01-19 ENCOUNTER — Other Ambulatory Visit: Payer: Self-pay

## 2024-01-19 ENCOUNTER — Encounter: Payer: Self-pay | Admitting: Emergency Medicine

## 2024-01-19 DIAGNOSIS — R1084 Generalized abdominal pain: Secondary | ICD-10-CM | POA: Diagnosis not present

## 2024-01-19 DIAGNOSIS — R197 Diarrhea, unspecified: Secondary | ICD-10-CM | POA: Diagnosis not present

## 2024-01-19 DIAGNOSIS — E1165 Type 2 diabetes mellitus with hyperglycemia: Secondary | ICD-10-CM | POA: Insufficient documentation

## 2024-01-19 DIAGNOSIS — R109 Unspecified abdominal pain: Secondary | ICD-10-CM | POA: Diagnosis present

## 2024-01-19 LAB — COMPREHENSIVE METABOLIC PANEL
ALT: 22 U/L (ref 0–44)
AST: 17 U/L (ref 15–41)
Albumin: 3.9 g/dL (ref 3.5–5.0)
Alkaline Phosphatase: 72 U/L (ref 38–126)
Anion gap: 11 (ref 5–15)
BUN: 20 mg/dL (ref 8–23)
CO2: 26 mmol/L (ref 22–32)
Calcium: 9.7 mg/dL (ref 8.9–10.3)
Chloride: 102 mmol/L (ref 98–111)
Creatinine, Ser: 0.91 mg/dL (ref 0.44–1.00)
GFR, Estimated: >60 mL/min (ref >60)
Glucose, Bld: 231 mg/dL — ABNORMAL HIGH (ref 70–99)
Potassium: 3.8 mmol/L (ref 3.5–5.1)
Sodium: 139 mmol/L (ref 135–145)
Total Bilirubin: 0.5 mg/dL (ref 0.0–1.2)
Total Protein: 7.6 g/dL (ref 6.5–8.1)

## 2024-01-19 LAB — URINALYSIS, ROUTINE W REFLEX MICROSCOPIC
Bilirubin Urine: NEGATIVE
Glucose, UA: NEGATIVE mg/dL
Ketones, ur: 5 mg/dL — AB
Leukocytes,Ua: NEGATIVE
Nitrite: NEGATIVE
Protein, ur: NEGATIVE mg/dL
Specific Gravity, Urine: >1.046 — ABNORMAL HIGH (ref 1.005–1.030)
pH: 7 (ref 5.0–8.0)

## 2024-01-19 LAB — CBC
HCT: 45.4 % (ref 36.0–46.0)
Hemoglobin: 13.8 g/dL (ref 12.0–15.0)
MCH: 24.6 pg — ABNORMAL LOW (ref 26.0–34.0)
MCHC: 30.4 g/dL (ref 30.0–36.0)
MCV: 80.8 fL (ref 80.0–100.0)
Platelets: 381 10*3/uL (ref 150–400)
RBC: 5.62 MIL/uL — ABNORMAL HIGH (ref 3.87–5.11)
RDW: 17.3 % — ABNORMAL HIGH (ref 11.5–15.5)
WBC: 15 10*3/uL — ABNORMAL HIGH (ref 4.0–10.5)
nRBC: 0 % (ref 0.0–0.2)

## 2024-01-19 LAB — LIPASE, BLOOD: Lipase: 20 U/L (ref 11–51)

## 2024-01-19 MED ORDER — HYDROMORPHONE HCL 1 MG/ML IJ SOLN
0.5000 mg | Freq: Once | INTRAMUSCULAR | Status: AC
Start: 2024-01-19 — End: 2024-01-19
  Administered 2024-01-19: 0.5 mg via INTRAVENOUS
  Filled 2024-01-19: qty 0.5

## 2024-01-19 MED ORDER — IOHEXOL 300 MG/ML  SOLN
100.0000 mL | Freq: Once | INTRAMUSCULAR | Status: AC | PRN
  Administered 2024-01-19: 100 mL via INTRAVENOUS

## 2024-01-19 MED ORDER — SODIUM CHLORIDE 0.9 % IV BOLUS
1000.0000 mL | Freq: Once | INTRAVENOUS | Status: AC
  Administered 2024-01-19: 1000 mL via INTRAVENOUS

## 2024-01-19 MED ORDER — KETOROLAC TROMETHAMINE 15 MG/ML IJ SOLN
15.0000 mg | Freq: Once | INTRAMUSCULAR | Status: AC
  Administered 2024-01-19: 15 mg via INTRAVENOUS
  Filled 2024-01-19: qty 1

## 2024-01-19 MED ORDER — HYDROMORPHONE HCL 1 MG/ML IJ SOLN
0.5000 mg | Freq: Once | INTRAMUSCULAR | Status: AC
  Administered 2024-01-19: 0.5 mg via INTRAVENOUS
  Filled 2024-01-19: qty 0.5

## 2024-01-19 MED ORDER — HYDROCODONE-ACETAMINOPHEN 5-325 MG PO TABS
1.0000 | ORAL_TABLET | Freq: Once | ORAL | Status: AC
  Administered 2024-01-19: 1 via ORAL
  Filled 2024-01-19: qty 1

## 2024-01-19 MED ORDER — ONDANSETRON HCL 4 MG/2ML IJ SOLN
4.0000 mg | Freq: Once | INTRAMUSCULAR | Status: AC
Start: 2024-01-19 — End: 2024-01-19
  Administered 2024-01-19: 4 mg via INTRAVENOUS
  Filled 2024-01-19: qty 2

## 2024-01-19 NOTE — ED Triage Notes (Signed)
 Pt here with abd pain and diarrhea since yesterday evening. Pt states she has been nauseous since Sat and not able to take her medications. Pt encores abd pain all over. Pt went to UC in [place] and was told her bp was low and she was dehydrated.

## 2024-01-19 NOTE — ED Provider Notes (Signed)
 [facility] Provider Note    Event Date/Time   First MD Initiated Contact with Patient 01/19/24 1456     (approximate)   History   Abdominal Pain   HPI  Kerri Steele is a 63 year old female with history of anemia, T2DM presenting to the emergency department for evaluation for evaluation of abdominal pain and nonbloody diarrhea.  Symptoms started yesterday evening.  Additionally reports nausea without vomiting.  Abdominal pain is diffuse.  Went to urgent care where there was concerns for dehydration and presents to the ER for further evaluation.     Physical Exam   Triage Vital Signs: ED Triage Vitals  Encounter Vitals Group     BP 01/19/24 1314 (!) 133/90     Systolic BP Percentile --      Diastolic BP Percentile --      Pulse Rate 01/19/24 1314 (!) 105     Resp 01/19/24 1314 20     Temp 01/19/24 1316 97.6 F (36.4 C)     Temp Source 01/19/24 1316 Axillary     SpO2 01/19/24 1314 96 %     Weight 01/19/24 1316 154 lb 15.7 oz (70.3 kg)     Height 01/19/24 1314 5\' 4"  (1.626 m)     Head Circumference --      Peak Flow --      Pain Score 01/19/24 1314 10     Pain Loc --      Pain Education --      Exclude from Growth Chart --     Most recent vital signs: Vitals:   01/19/24 1316 01/19/24 1807  BP:  (!) 105/46  Pulse:  85  Resp:  14  Temp: 97.6 F (36.4 C) 98.6 F (37 C)  SpO2:  98%     General: Awake, interactive  CV:  Mild tachycardia, good peripheral perfusion Resp:  Unlabored respirations, lungs clear to auscultation Abd:  Nondistended, soft, appears uncomfortable, mild generalized tenderness to palpation  Neuro:  Symmetric facial movement, fluid speech   ED Results / Procedures / Treatments   Labs (all labs ordered are listed, but only abnormal results are displayed) Labs Reviewed  COMPREHENSIVE METABOLIC PANEL - Abnormal; Notable for the following components:      Result Value   Glucose, Bld 231 (*)    All other components  within normal limits  CBC - Abnormal; Notable for the following components:   WBC 15.0 (*)    RBC 5.62 (*)    MCH 24.6 (*)    RDW 17.3 (*)    All other components within normal limits  URINALYSIS, ROUTINE W REFLEX MICROSCOPIC - Abnormal; Notable for the following components:   Color, Urine YELLOW (*)    APPearance CLEAR (*)    Specific Gravity, Urine >1.046 (*)    Hgb urine dipstick SMALL (*)    Ketones, ur 5 (*)    Bacteria, UA RARE (*)    All other components within normal limits  LIPASE, BLOOD     EKG EKG independently reviewed interpreted by myself (ER attending) demonstrates:    RADIOLOGY Imaging independently reviewed and interpreted by myself demonstrates:  CT abdomen pelvis demonstrates bladder wall thickening, stable stone in a ureterocele   PROCEDURES:  Critical Care performed: No  Procedures   MEDICATIONS ORDERED IN ED: Medications  ondansetron (ZOFRAN) injection 4 mg (4 mg Intravenous Given 01/19/24 1514)  sodium chloride 0.9 % bolus 1,000 mL (0 mLs Intravenous Stopped 01/19/24 1610)  HYDROmorphone (DILAUDID)  injection 0.5 mg (0.5 mg Intravenous Given 01/19/24 1513)  iohexol (OMNIPAQUE) 300 MG/ML solution 100 mL (100 mLs Intravenous Contrast Given 01/19/24 1642)  HYDROmorphone (DILAUDID) injection 0.5 mg (0.5 mg Intravenous Given 01/19/24 1748)  HYDROcodone-acetaminophen (NORCO/VICODIN) 5-325 MG per tablet 1 tablet (1 tablet Oral Given 01/19/24 2016)  ketorolac (TORADOL) 15 MG/ML injection 15 mg (15 mg Intravenous Given 01/19/24 2017)     IMPRESSION / MDM / ASSESSMENT AND PLAN / ED COURSE  I reviewed the triage vital signs and the nursing notes.  Differential diagnosis includes, but is not limited to, viral GI illness, diverticulitis, colitis, dehydration, electrolyte abnormality  Patient's presentation is most consistent with acute presentation with potential threat to life or bodily function.  63 year old female presenting with abdominal pain and diarrhea.   Mild tachycardia on presentation.  Labs leukocytosis WBC 15, CMP with mild hyperglycemia without evidence of DKA.  Will treat symptomatically with fluids, Zofran, Dilaudid.  CT ordered to further evaluate.  CT with bladder wall thickening, but patient denies any urinary symptoms and urinalysis overall not suggestive of infection.  No other acute findings on CT.  Did have some ongoing pain after initial pain medication, did trial Norco and Toradol.  Did feel much improved after this.  She was able to tolerate a p.o. trial.  She is comfortable with discharge home.  I did offer to discharge her with pain and nausea medication, but patient declines these.  Strict return precautions were provided.  She was discharged in stable condition.       FINAL CLINICAL IMPRESSION(S) / ED DIAGNOSES   Final diagnoses:  Generalized abdominal pain  Diarrhea, unspecified type     Rx / DC Orders   ED Discharge Orders     None        Note:  This document was prepared using Dragon voice recognition software and may include unintentional dictation errors.   Trinna Post, MD 01/19/24 [phone]

## 2024-01-19 NOTE — ED Notes (Signed)
 Pt wheeled back to rm from br. Pt has no further request at this time.

## 2024-01-19 NOTE — Discharge Instructions (Signed)
You were seen in the Emergency Department today for evaluation of your abdominal pain. Fortunately, your labs, urine test, and CT scan were overall reassuring against an emergency cause for your pain. Please follow-up with your primary care doctor within the next few days for reevaluation. Return to the ER for any new or worsening symptoms including worsening pain, inability to tolerate food or liquids, or any other new or concerning symptoms 

## 2024-09-28 ENCOUNTER — Other Ambulatory Visit: Payer: Self-pay | Admitting: Physical Medicine and Rehabilitation

## 2024-09-28 DIAGNOSIS — M5412 Radiculopathy, cervical region: Secondary | ICD-10-CM

## 2024-10-18 ENCOUNTER — Inpatient Hospital Stay: Admission: RE | Admit: 2024-10-18 | Source: Ambulatory Visit

## 2024-11-08 ENCOUNTER — Inpatient Hospital Stay: Admission: RE | Admit: 2024-11-08 | Source: Ambulatory Visit

## 2024-11-10 ENCOUNTER — Ambulatory Visit: Admitting: Urology

## 2024-11-19 ENCOUNTER — Ambulatory Visit
Admission: RE | Admit: 2024-11-19 | Discharge: 2024-11-19 | Disposition: A | Source: Ambulatory Visit | Attending: Physical Medicine and Rehabilitation | Admitting: Physical Medicine and Rehabilitation

## 2024-11-19 DIAGNOSIS — M5412 Radiculopathy, cervical region: Secondary | ICD-10-CM

## 2024-11-24 ENCOUNTER — Ambulatory Visit: Admitting: Urology

## 2024-11-24 ENCOUNTER — Encounter: Payer: Self-pay | Admitting: Urology

## 2024-11-24 VITALS — BP 135/76 | HR 67 | Ht 60.0 in | Wt 155.0 lb

## 2024-11-24 DIAGNOSIS — N201 Calculus of ureter: Secondary | ICD-10-CM | POA: Diagnosis not present

## 2024-11-24 LAB — URINALYSIS, COMPLETE

## 2024-11-24 LAB — MICROSCOPIC EXAMINATION: RBC, Urine: >30 /HPF — AB (ref 0–2)

## 2024-11-24 NOTE — Progress Notes (Signed)
 "  11/24/2024 1:03 PM   Kerri Steele 01-31-1961 [id number]  Referring provider: Johnson Morna FALCON, NP [address] [place],  [place] [address]  Chief Complaint  Patient presents with   Nephrolithiasis    HPI: Kerri Steele is a 64 y.o. female referred for evaluation of nephrolithiasis.  CT abdomen pelvis performed 01/19/2024 showed a prominent right ureter with a 3 x 5 mm stone felt to be within a ureterocele.  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  She states a bladder was constructed from Teflon and surgically implanted.  She has no bothersome lower urinary tract symptoms Past medical history remarkable for asthma/COPD, atrial fibrillation, history of CVA 2021   PMH: Past Medical History:  Diagnosis Date   Anginal pain    Anxiety    Asthma    COPD (chronic obstructive pulmonary disease) (HCC)    Depression    Diabetes mellitus without complication (HCC)    GERD (gastroesophageal reflux disease)    Has artificial bladder    Hypertension    Hypothyroidism    Pneumonia    Shortness of breath dyspnea    Sleep apnea     Surgical History: Past Surgical History:  Procedure Laterality Date   ANKLE SURGERY     BLADDER SURGERY     CARDIAC CATHETERIZATION Left 08/23/2015   Procedure: Left Heart Cath and Coronary Angiography;  Surgeon: Vinie DELENA Jude, MD;  Location: [facility] INVASIVE CV [facility];  Service: Cardiovascular;  Laterality: Left;   COLONOSCOPY WITH PROPOFOL  N/A 05/20/2021   Procedure: COLONOSCOPY WITH PROPOFOL ;  Surgeon: Jinny Carmine, MD;  Location: [facility] ENDOSCOPY;  Service: Endoscopy;  Laterality: N/A;   ESOPHAGEAL DILATION     ESOPHAGOGASTRODUODENOSCOPY (EGD) WITH PROPOFOL  N/A 05/20/2021   Procedure: ESOPHAGOGASTRODUODENOSCOPY (EGD) WITH PROPOFOL ;  Surgeon:  Jinny Carmine, MD;  Location: [facility] ENDOSCOPY;  Service: Endoscopy;  Laterality: N/A;   LOOP RECORDER INSERTION N/A 06/01/2020   Procedure: LOOP RECORDER INSERTION;  Surgeon: Ammon Blunt, MD;  Location: [facility] INV[facility] CV LAB;  Service: Cardiovascular;  Laterality: N/A;   LOOP RECORDER REMOVAL N/A 09/17/2023   Procedure: LOOP RECORDER REMOVAL;  Surgeon: Ammon Blunt, MD;  Location: [facility]VAS[facility] CV [facility];  Service: Cardiovascular;  Laterality: N/A;   TEE WITHOUT CARDIOVERSION N/A 06/01/2020   Procedure: TRANSESOPHAGEAL ECHOCARDIOGRAM (TEE);  Surgeon: Hester Wolm PARAS, MD;  Location: [facility] ORS;  Service: Cardiovascular;  Laterality: N/A;   TUBAL LIGATION     WRIST SURGERY      Home Medications:  Allergies as of 11/24/2024       Reactions   Metformin And Related Other (See Comments)   migraine   Morphine And Codeine Hives   Other Other (See Comments)   Latex allergy   Sulfa Antibiotics Itching        Medication List        Accurate as of November 24, 2024  1:03 PM. If you have any questions, ask your nurse or doctor.          STOP taking these medications    predniSONE  5 MG tablet Commonly known as: DELTASONE  Stopped by: Glendia Barba, MD       TAKE these medications    atorvastatin  40 MG tablet Commonly known as: LIPITOR Take 40 mg by mouth at bedtime.  cholecalciferol 25 MCG (1000 UNIT) tablet Commonly known as: VITAMIN D3 Take 1,000 Units by mouth daily.   Combivent Respimat 20-100 MCG/ACT Aers respimat Generic drug: Ipratropium-Albuterol  Inhale 1 puff into the lungs 4 (four) times daily.   ipratropium-albuterol  0.5-2.5 (3) MG/3ML Soln Commonly known as: DUONEB Take 3 mLs by nebulization every 4 (four) hours as needed (Asthma).   cyanocobalamin  1000 MCG tablet Commonly known as: VITAMIN B12 Take 1,000 mcg by mouth daily.   dicyclomine  10 MG capsule Commonly known as: BENTYL  Take 1 capsule (10 mg total) by mouth every 8 (eight) hours as  needed (abdominal pain, cramping).   donepezil 10 MG tablet Commonly known as: ARICEPT Take 10 mg by mouth at bedtime. What changed: Another medication with the same name was removed. Continue taking this medication, and follow the directions you see here. Changed by: Glendia Barba, MD   DULoxetine  60 MG capsule Commonly known as: CYMBALTA  Take 60 mg by mouth daily.   Eliquis  5 MG Tabs tablet Generic drug: apixaban  Take 5 mg by mouth 2 (two) times daily.   glipiZIDE  10 MG 24 hr tablet Commonly known as: GLUCOTROL  XL Take 10 mg by mouth daily with breakfast.   magnesium  oxide 400 MG tablet Commonly known as: MAG-OX Take 400 mg by mouth 2 (two) times daily.   meclizine 25 MG tablet Commonly known as: ANTIVERT Take 25 mg by mouth 3 (three) times daily as needed for dizziness.   methocarbamol  750 MG tablet Commonly known as: ROBAXIN  Take 750 mg by mouth every 8 (eight) hours as needed for muscle spasms.   montelukast 10 MG tablet Commonly known as: SINGULAIR Take 10 mg by mouth at bedtime.   nortriptyline 10 MG capsule Commonly known as: PAMELOR Take 40 mg by mouth at bedtime.   ondansetron  4 MG disintegrating tablet Commonly known as: ZOFRAN -ODT Take 1 tablet (4 mg total) by mouth every 8 (eight) hours as needed.   pantoprazole  40 MG tablet Commonly known as: PROTONIX  Take 40 mg by mouth 2 (two) times daily.   pregabalin 150 MG capsule Commonly known as: LYRICA Take 150 mg by mouth 3 (three) times daily as needed (pain).   traMADol  50 MG tablet Commonly known as: ULTRAM  Take 100 mg by mouth 2 (two) times daily.   verapamil  120 MG CR tablet Commonly known as: CALAN -SR Take 120 mg by mouth 2 (two) times daily.        Allergies: Allergies[1]  Family History: Family History  Problem Relation Age of Onset   Hypertension Mother    Diabetes Mother    Lupus Mother    Atrial fibrillation Mother    Heart disease Father    Acute myelogenous leukemia Nephew      Social History:  reports that she quit smoking about 14 years ago. Her smoking use included cigarettes. She started smoking about 49 years ago. She has a 70 pack-year smoking history. She has never used smokeless tobacco. She reports that she does not currently use alcohol. She reports current drug use.   Physical Exam: BP 135/76   Pulse 67   Ht 5' (1.524 m)   Wt 155 lb (70.3 kg)   LMP 07/30/2015 Comment: tubal ligation  BMI 30.27 kg/m   Constitutional:  Alert, No acute distress. HEENT: [place] AT Respiratory: Normal respiratory effort, no increased work of breathing. Psychiatric: Normal mood and affect.   Pertinent Imaging: CT images were personally reviewed and interpreted   Assessment & Plan:    1.  Right ureteral calculus Recently developed bothersome right flank pain with gross hematuria History of major reconstructive surgery of the bladder and anatomy may be altered Ureteroscopy was discussed including potential risks of bleeding, infection, ureteral injury and the need for ureteral stent.  She may have altered anatomy and we discussed possibility that her stone may not be able to be removed.  The alternative of referral to a tertiary center initially was discussed however she would like an initial attempt at ureteroscopy locally If stone is within a ureterocele the UO may be narrowed and SWL may not be successful and passage of stone fragments She would like to tentatively schedule right ureteroscopy with laser lithotripsy/stone removal.  Possibility of stent symptoms/pain was discussed Will need anesthesia clearance   Glendia JAYSON Barba, MD  [facility] [address], [address] [place], [place] [address] [phone]     [1]  Allergies Allergen Reactions   Metformin And Related Other (See Comments)    migraine   Morphine And Codeine Hives   Other Other (See Comments)    Latex allergy   Sulfa Antibiotics Itching   "

## 2024-11-24 NOTE — H&P (View-Only) (Signed)
 "  11/24/2024 1:03 PM   Kerri Steele 01-31-1961 [id number]  Referring provider: Johnson Morna FALCON, NP [address] [place],  [place] [address]  Chief Complaint  Patient presents with   Nephrolithiasis    HPI: Kerri Steele is a 64 y.o. female referred for evaluation of nephrolithiasis.  CT abdomen pelvis performed 01/19/2024 showed a prominent right ureter with a 3 x 5 mm stone felt to be within a ureterocele.  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  She states a bladder was constructed from Teflon and surgically implanted.  She has no bothersome lower urinary tract symptoms Past medical history remarkable for asthma/COPD, atrial fibrillation, history of CVA 2021   PMH: Past Medical History:  Diagnosis Date   Anginal pain    Anxiety    Asthma    COPD (chronic obstructive pulmonary disease) (HCC)    Depression    Diabetes mellitus without complication (HCC)    GERD (gastroesophageal reflux disease)    Has artificial bladder    Hypertension    Hypothyroidism    Pneumonia    Shortness of breath dyspnea    Sleep apnea     Surgical History: Past Surgical History:  Procedure Laterality Date   ANKLE SURGERY     BLADDER SURGERY     CARDIAC CATHETERIZATION Left 08/23/2015   Procedure: Left Heart Cath and Coronary Angiography;  Surgeon: Vinie DELENA Jude, MD;  Location: [facility] CV [facility];  Service: Cardiovascular;  Laterality: Left;   COLONOSCOPY WITH PROPOFOL  N/A 05/20/2021   Procedure: COLONOSCOPY WITH PROPOFOL ;  Surgeon: Jinny Carmine, MD;  Location: [facility] ENDOS[facility];  Service: Endoscopy;  Laterality: N/A;   ESOPHAGEAL DILATION     ESOPHAGOGASTRODUODENOSCOPY (EGD) WITH PROPOFOL  N/A 05/20/2021   Procedure: ESOPHAGOGASTRODUODENOSCOPY (EGD) WITH PROPOFOL ;  Surgeon:  Jinny Carmine, MD;  Location: [facility]OS[facility];  Service: Endoscopy;  Laterality: N/A;   LOOP RECORDER INSERTION N/A 06/01/2020   Procedure: LOOP RECORDER INSERTION;  Surgeon: Ammon Blunt, MD;  Location: [facility];  Service: Cardiovascular;  Laterality: N/A;   LOOP RECORDER REMOVAL N/A 09/17/2023   Procedure: LOOP RECORDER REMOVAL;  Surgeon: Ammon Blunt, MD;  Location: [facility];  Service: Cardiovascular;  Laterality: N/A;   TEE WITHOUT CARDIOVERSION N/A 06/01/2020   Procedure: TRANSESOPHAGEAL ECHOCARDIOGRAM (TEE);  Surgeon: Hester Wolm PARAS, MD;  Location: [facility] ORS;  Service: Cardiovascular;  Laterality: N/A;   TUBAL LIGATION     WRIST SURGERY      Home Medications:  Allergies as of 11/24/2024       Reactions   Metformin And Related Other (See Comments)   migraine   Morphine And Codeine Hives   Other Other (See Comments)   Latex allergy   Sulfa Antibiotics Itching        Medication List        Accurate as of November 24, 2024  1:03 PM. If you have any questions, ask your nurse or doctor.          STOP taking these medications    predniSONE  5 MG tablet Commonly known as: DELTASONE  Stopped by: Glendia Barba, MD       TAKE these medications    atorvastatin  40 MG tablet Commonly known as: LIPITOR Take 40 mg by mouth at bedtime.  cholecalciferol 25 MCG (1000 UNIT) tablet Commonly known as: VITAMIN D3 Take 1,000 Units by mouth daily.   Combivent Respimat 20-100 MCG/ACT Aers respimat Generic drug: Ipratropium-Albuterol  Inhale 1 puff into the lungs 4 (four) times daily.   ipratropium-albuterol  0.5-2.5 (3) MG/3ML Soln Commonly known as: DUONEB Take 3 mLs by nebulization every 4 (four) hours as needed (Asthma).   cyanocobalamin  1000 MCG tablet Commonly known as: VITAMIN B12 Take 1,000 mcg by mouth daily.   dicyclomine  10 MG capsule Commonly known as: BENTYL  Take 1 capsule (10 mg total) by mouth every 8 (eight) hours as  needed (abdominal pain, cramping).   donepezil 10 MG tablet Commonly known as: ARICEPT Take 10 mg by mouth at bedtime. What changed: Another medication with the same name was removed. Continue taking this medication, and follow the directions you see here. Changed by: Glendia Barba, MD   DULoxetine  60 MG capsule Commonly known as: CYMBALTA  Take 60 mg by mouth daily.   Eliquis  5 MG Tabs tablet Generic drug: apixaban  Take 5 mg by mouth 2 (two) times daily.   glipiZIDE  10 MG 24 hr tablet Commonly known as: GLUCOTROL  XL Take 10 mg by mouth daily with breakfast.   magnesium  oxide 400 MG tablet Commonly known as: MAG-OX Take 400 mg by mouth 2 (two) times daily.   meclizine 25 MG tablet Commonly known as: ANTIVERT Take 25 mg by mouth 3 (three) times daily as needed for dizziness.   methocarbamol  750 MG tablet Commonly known as: ROBAXIN  Take 750 mg by mouth every 8 (eight) hours as needed for muscle spasms.   montelukast 10 MG tablet Commonly known as: SINGULAIR Take 10 mg by mouth at bedtime.   nortriptyline 10 MG capsule Commonly known as: PAMELOR Take 40 mg by mouth at bedtime.   ondansetron  4 MG disintegrating tablet Commonly known as: ZOFRAN -ODT Take 1 tablet (4 mg total) by mouth every 8 (eight) hours as needed.   pantoprazole  40 MG tablet Commonly known as: PROTONIX  Take 40 mg by mouth 2 (two) times daily.   pregabalin 150 MG capsule Commonly known as: LYRICA Take 150 mg by mouth 3 (three) times daily as needed (pain).   traMADol  50 MG tablet Commonly known as: ULTRAM  Take 100 mg by mouth 2 (two) times daily.   verapamil  120 MG CR tablet Commonly known as: CALAN -SR Take 120 mg by mouth 2 (two) times daily.        Allergies: Allergies[1]  Family History: Family History  Problem Relation Age of Onset   Hypertension Mother    Diabetes Mother    Lupus Mother    Atrial fibrillation Mother    Heart disease Father    Acute myelogenous leukemia Nephew      Social History:  reports that she quit smoking about 14 years ago. Her smoking use included cigarettes. She started smoking about 49 years ago. She has a 70 pack-year smoking history. She has never used smokeless tobacco. She reports that she does not currently use alcohol. She reports current drug use.   Physical Exam: BP 135/76   Pulse 67   Ht 5' (1.524 m)   Wt 155 lb (70.3 kg)   LMP 07/30/2015 Comment: tubal ligation  BMI 30.27 kg/m   Constitutional:  Alert, No acute distress. HEENT: [place] AT Respiratory: Normal respiratory effort, no increased work of breathing. Psychiatric: Normal mood and affect.   Pertinent Imaging: CT images were personally reviewed and interpreted   Assessment & Plan:    1.  Right ureteral calculus Recently developed bothersome right flank pain with gross hematuria History of major reconstructive surgery of the bladder and anatomy may be altered Ureteroscopy was discussed including potential risks of bleeding, infection, ureteral injury and the need for ureteral stent.  She may have altered anatomy and we discussed possibility that her stone may not be able to be removed.  The alternative of referral to a tertiary center initially was discussed however she would like an initial attempt at ureteroscopy locally If stone is within a ureterocele the UO may be narrowed and SWL may not be successful and passage of stone fragments She would like to tentatively schedule right ureteroscopy with laser lithotripsy/stone removal.  Possibility of stent symptoms/pain was discussed Will need anesthesia clearance   Glendia JAYSON Barba, MD  [facility] [address], [address] [place], [place] [address] [phone]     [1]  Allergies Allergen Reactions   Metformin And Related Other (See Comments)    migraine   Morphine And Codeine Hives   Other Other (See Comments)    Latex allergy   Sulfa Antibiotics Itching   "

## 2024-11-25 ENCOUNTER — Other Ambulatory Visit: Payer: Self-pay

## 2024-11-25 DIAGNOSIS — N201 Calculus of ureter: Secondary | ICD-10-CM

## 2024-11-25 NOTE — Progress Notes (Signed)
 Surgical Physician Order Form  [facility]  Dr. Glendia Barba, MD  * Scheduling expectation : Next Available  *Length of Case: 60 minutes  *Clearance needed: yes: Atrial fibrillation, asthma/COPD  *Anticoagulation Instructions: Hold all anticoagulants  *Aspirin  Instructions: N/A  *Post-op visit Date/Instructions: TBD  *Diagnosis: Right Ureteral Stone  *Procedure: right Ureteroscopy w/laser lithotripsy & stent placement (47643)   Additional orders: N/A  -Admit type: OUTpatient  -Anesthesia: Choice  -VTE Prophylaxis Standing Order SCD's       Other:   -Standing Lab Orders Per Anesthesia    Lab other: UA&Urine Culture ordered 11/24/2024  -Standing Test orders EKG/Chest x-ray per Anesthesia       Test other:   - Medications:  Ancef  2gm IV  -Other orders:  N/A

## 2024-11-27 LAB — CULTURE, URINE COMPREHENSIVE

## 2024-11-29 ENCOUNTER — Telehealth: Payer: Self-pay

## 2024-11-29 NOTE — Progress Notes (Signed)
" °  Phone Number: [phone] for Surgical Coordinator Fax Number: [phone]  REQUEST FOR SURGICAL CLEARANCE      Date: 11/29/2024  Faxed to: Dr. Ammon, MD  Surgeon: Dr. Glendia Barba, MD     Date of Surgery: 12/16/2024  Operation: Right Ureteroscopy with Laser Lithotripsy and Stent Placement   Anesthesia Type: Choice  Diagnosis: Right Ureteral Stone  Patient Requires:   Cardiac / Vascular Clearance : Yes  Reason: Would like for patient to hold Eliquis  prior to surgery  Risk Assessment:    Low   []       Moderate   []     High   []           This patient is optimized for surgery  YES []       NO   []    I recommend further assessment/workup prior to surgery. YES []      NO  []   Appointment scheduled for: _______________________   Further recommendations: ____________________________________     Physician Signature:__________________________________   Printed Name: ________________________________________   Date: _________________    "

## 2024-11-29 NOTE — Telephone Encounter (Signed)
 Per Dr. Twylla, Patient is to be scheduled for Right Ureteroscopy with Laser Lithotripsy and Stent Placement   Kerri Steele was contacted and possible surgical dates were discussed, Thursday February 5th, 2026 was agreed upon for surgery.   Patient was directed to call [phone] between 1-3pm the day before surgery to find out surgical arrival time.  Instructions were given not to eat or drink from midnight on the night before surgery and have a driver for the day of surgery. On the surgery day patient was instructed to enter through the Medical Mall entrance of [facility] report the Same Day Surgery desk.   Pre-Admit Testing will be in contact via phone to set up an interview with the anesthesia team to review your history and medications prior to surgery.   Reminder of this information was sent via MyChart and mailed to the patient.

## 2024-11-29 NOTE — Progress Notes (Signed)
" ° °  [facility] Surgical Posting Form  Surgery Date: Date: 12/16/2024  Surgeon: Dr. Glendia Barba, MD  Inpt ( No  )   Outpt (Yes)   Obs ( No  )   Diagnosis: N20.1 Right Ureteral Stone  -CPT: [phone]  Surgery: Right Ureteroscopy with Laser Lithotripsy and Stent Placement  Stop Anticoagulations: Yes and will also need to hold ASA  Cardiac/Medical/Pulmonary Clearance needed: Yes  Clearance needed from Dr: Ammon  Clearance request sent on: Date: 11/29/24  *Orders entered into EPIC  Date: 11/29/24   *Case booked in EPIC  Date: 11/29/24  *Notified pt of Surgery: Date: 11/29/24  PRE-OP UA & CX: yes, obtained while in clinic on 11/24/2024  *Placed into Prior Authorization Work [place] Date: 11/29/24  Assistant/laser/rep:No                "

## 2024-12-03 ENCOUNTER — Telehealth: Payer: Self-pay

## 2024-12-03 NOTE — Telephone Encounter (Signed)
 Received Surgical Clearance- Patient may hold Eliquis  for 2 days prior to surgery. Spoke with patient and verbalized understanding.

## 2024-12-09 ENCOUNTER — Other Ambulatory Visit: Payer: Self-pay

## 2024-12-09 ENCOUNTER — Other Ambulatory Visit: Payer: Self-pay | Admitting: Pulmonary Disease

## 2024-12-09 ENCOUNTER — Encounter
Admission: RE | Admit: 2024-12-09 | Discharge: 2024-12-09 | Disposition: A | Discharge status: Home / Self Care | Source: Ambulatory Visit | Attending: Urology | Admitting: Urology

## 2024-12-09 VITALS — Ht 60.0 in | Wt 156.0 lb

## 2024-12-09 DIAGNOSIS — Z122 Encounter for screening for malignant neoplasm of respiratory organs: Secondary | ICD-10-CM

## 2024-12-09 DIAGNOSIS — Z79899 Other long term (current) drug therapy: Secondary | ICD-10-CM

## 2024-12-09 DIAGNOSIS — I471 Supraventricular tachycardia, unspecified: Secondary | ICD-10-CM

## 2024-12-09 DIAGNOSIS — F17210 Nicotine dependence, cigarettes, uncomplicated: Secondary | ICD-10-CM

## 2024-12-09 DIAGNOSIS — E785 Hyperlipidemia, unspecified: Secondary | ICD-10-CM

## 2024-12-09 DIAGNOSIS — E1169 Type 2 diabetes mellitus with other specified complication: Secondary | ICD-10-CM

## 2024-12-09 DIAGNOSIS — I480 Paroxysmal atrial fibrillation: Secondary | ICD-10-CM

## 2024-12-09 HISTORY — DX: Anemia, unspecified: D64.9

## 2024-12-09 HISTORY — DX: Hyperlipidemia, unspecified: E78.5

## 2024-12-09 HISTORY — DX: Family history of other specified conditions: Z84.89

## 2024-12-09 HISTORY — DX: Cerebral infarction, unspecified: I63.9

## 2024-12-09 HISTORY — DX: Nausea with vomiting, unspecified: R11.2

## 2024-12-09 HISTORY — DX: Paroxysmal atrial fibrillation: I48.0

## 2024-12-09 HISTORY — DX: Other complications of anesthesia, initial encounter: T88.59XA

## 2024-12-09 HISTORY — DX: Supraventricular tachycardia, unspecified: I47.10

## 2024-12-09 HISTORY — DX: Other intervertebral disc degeneration, lumbar region without mention of lumbar back pain or lower extremity pain: M51.369

## 2024-12-09 NOTE — Patient Instructions (Addendum)
 Your procedure is scheduled on: Thursday 12/16/24 To find out your arrival time, please call [phone] between 1PM - 3PM on: Wednesday 12/15/24 Report to the Registration Desk on the 1st floor of the Medical Mall. If your arrival time is 6:00 am, do not arrive before that time as the Medical Mall entrance doors do not open until 6:00 am.  REMEMBER: Instructions that are not followed completely may result in serious medical risk, up to and including death; or upon the discretion of your surgeon and anesthesiologist your surgery may need to be rescheduled.  Do not eat food or drink any liquids after midnight the night before surgery.  No gum chewing or hard candies.  One week prior to surgery: Stop Anti-inflammatories (NSAIDS) such as Advil, Aleve, Ibuprofen, Motrin, Naproxen, Naprosyn and Aspirin  based products such as Excedrin, Goody's Powder, BC Powder.  You may however, continue to take Tylenol  if needed for pain up until the day of surgery.  Stop ANY OVER THE COUNTER supplements and vitamins for at least 7 days until after surgery.  **Follow recommendations regarding stopping blood thinners.** ELIQUIS  last dose will be Monday 12/13/24  Continue taking all of your other prescription medications up until the day of surgery.  ON THE DAY OF SURGERY ONLY TAKE THESE MEDICATIONS WITH SIPS OF WATER:  methocarbamol  (ROBAXIN ) 750 MG tablet  pantoprazole  (PROTONIX ) 40 MG tablet  pregabalin (LYRICA) 150 MG capsule  verapamil  (CALAN -SR) 120 MG CR tablet  traMADol  (ULTRAM ) 50 MG tablet   Use inhalers on the day of surgery and bring to the hospital.  No Alcohol for 24 hours before or after surgery.  No Smoking including e-cigarettes for 24 hours before surgery.  No chewable tobacco products for at least 6 hours before surgery.  No nicotine patches on the day of surgery.  Do not use any recreational drugs for at least a week (preferably 2 weeks) before your surgery.  Please be advised that  the combination of cocaine and anesthesia may have negative outcomes, up to and including death. If you test positive for cocaine, your surgery will be cancelled.  On the morning of surgery brush your teeth with toothpaste and water, you may rinse your mouth with mouthwash if you wish. Do not swallow any toothpaste or mouthwash.  Shower or bathe before arriving for your procedure  Do not shave body hair from the neck down 48 hours before surgery.  Do not wear lotions, powders, or perfumes.   Wear comfortable clothing (specific to your surgery type) to the hospital.  Do not wear jewelry, make-up, hairpins, clips or nail polish.  For welded (permanent) jewelry: bracelets, anklets, waist bands, etc.  Please have this removed prior to surgery.  If it is not removed, there is a chance that hospital personnel will need to cut it off on the day of surgery.  Contact lenses, hearing aids and dentures may not be worn into surgery.  Do not bring valuables to the hospital. [facility] is not responsible for any missing/lost belongings or valuables.   Notify your doctor if there is any change in your medical condition (cold, fever, infection).  After surgery, you can help prevent lung complications by doing breathing exercises.  Take deep breaths and cough every 1-2 hours. Your doctor may order a device called an Incentive Spirometer to help you take deep breaths.  If you are being discharged the day of surgery, you will not be allowed to drive home. You will need a responsible individual to  drive you home and stay with you for 24 hours after surgery.   Please call the Pre-admissions Testing Dept. at [phone] if you have any questions about these instructions.  Surgery Visitation Policy:  Patients having surgery or a procedure may have two visitors.  Children under the age of 29 must have an adult with them who is not the patient.  Merchandiser, Retail to address health-related  social needs:  [email or web address]

## 2024-12-10 ENCOUNTER — Encounter: Payer: Self-pay | Admitting: Urology

## 2024-12-14 ENCOUNTER — Encounter: Payer: Self-pay | Admitting: Urology

## 2024-12-15 ENCOUNTER — Encounter: Payer: Self-pay | Admitting: Urology

## 2024-12-16 ENCOUNTER — Other Ambulatory Visit: Payer: Self-pay

## 2024-12-16 ENCOUNTER — Ambulatory Visit

## 2024-12-16 ENCOUNTER — Encounter: Admitting: Urgent Care

## 2024-12-16 ENCOUNTER — Ambulatory Visit
Admission: RE | Admit: 2024-12-16 | Discharge: 2024-12-16 | Disposition: A | Source: Home / Self Care | Attending: Urology | Admitting: Urology

## 2024-12-16 ENCOUNTER — Encounter: Payer: Self-pay | Admitting: Oncology

## 2024-12-16 ENCOUNTER — Ambulatory Visit: Payer: Self-pay | Admitting: Urgent Care

## 2024-12-16 ENCOUNTER — Encounter: Admission: RE | Disposition: A | Payer: Self-pay | Source: Home / Self Care | Attending: Urology

## 2024-12-16 ENCOUNTER — Encounter: Payer: Self-pay | Admitting: Urology

## 2024-12-16 DIAGNOSIS — E785 Hyperlipidemia, unspecified: Secondary | ICD-10-CM

## 2024-12-16 DIAGNOSIS — I480 Paroxysmal atrial fibrillation: Secondary | ICD-10-CM

## 2024-12-16 DIAGNOSIS — I471 Supraventricular tachycardia, unspecified: Secondary | ICD-10-CM

## 2024-12-16 DIAGNOSIS — N201 Calculus of ureter: Secondary | ICD-10-CM

## 2024-12-16 DIAGNOSIS — E1169 Type 2 diabetes mellitus with other specified complication: Secondary | ICD-10-CM

## 2024-12-16 HISTORY — DX: Long term (current) use of systemic steroids: Z79.52

## 2024-12-16 HISTORY — DX: Type 2 diabetes mellitus without complications: E11.9

## 2024-12-16 HISTORY — DX: Personal history of (corrected) congenital malformations of heart and circulatory system: Z87.74

## 2024-12-16 HISTORY — DX: Other long term (current) drug therapy: Z79.899

## 2024-12-16 HISTORY — DX: Esophageal obstruction: K22.2

## 2024-12-16 HISTORY — DX: Atherosclerosis of aorta: I70.0

## 2024-12-16 HISTORY — DX: Long term (current) use of anticoagulants: Z79.01

## 2024-12-16 HISTORY — DX: Chronic respiratory failure, unspecified whether with hypoxia or hypercapnia: J96.10

## 2024-12-16 HISTORY — DX: Dependence on supplemental oxygen: Z99.81

## 2024-12-16 HISTORY — DX: Mild cognitive impairment of uncertain or unknown etiology: G31.84

## 2024-12-16 LAB — BASIC METABOLIC PANEL WITH GFR
Anion gap: 11 (ref 5–15)
BUN: 14 mg/dL (ref 8–23)
CO2: 27 mmol/L (ref 22–32)
Calcium: 9.1 mg/dL (ref 8.9–10.3)
Chloride: 103 mmol/L (ref 98–111)
Creatinine, Ser: 1.06 mg/dL — ABNORMAL HIGH (ref 0.44–1.00)
GFR, Estimated: 59 mL/min — ABNORMAL LOW
Glucose, Bld: 72 mg/dL (ref 70–99)
Potassium: 4.5 mmol/L (ref 3.5–5.1)
Sodium: 140 mmol/L (ref 135–145)

## 2024-12-16 LAB — CBC
HCT: 37.7 % (ref 36.0–46.0)
Hemoglobin: 11.2 g/dL — ABNORMAL LOW (ref 12.0–15.0)
MCH: 21.7 pg — ABNORMAL LOW (ref 26.0–34.0)
MCHC: 29.7 g/dL — ABNORMAL LOW (ref 30.0–36.0)
MCV: 73.2 fL — ABNORMAL LOW (ref 80.0–100.0)
Platelets: 340 10*3/uL (ref 150–400)
RBC: 5.15 MIL/uL — ABNORMAL HIGH (ref 3.87–5.11)
RDW: 19 % — ABNORMAL HIGH (ref 11.5–15.5)
WBC: 11 10*3/uL — ABNORMAL HIGH (ref 4.0–10.5)
nRBC: 0 % (ref 0.0–0.2)

## 2024-12-16 LAB — GLUCOSE, CAPILLARY
Glucose-Capillary: 68 mg/dL — ABNORMAL LOW (ref 70–99)
Glucose-Capillary: 145 mg/dL — ABNORMAL HIGH (ref 70–99)

## 2024-12-16 MED ORDER — CEFAZOLIN SODIUM-DEXTROSE 2-4 GM/100ML-% IV SOLN
INTRAVENOUS | Status: AC
  Filled 2024-12-16: qty 100

## 2024-12-16 MED ORDER — KETOROLAC TROMETHAMINE 30 MG/ML IJ SOLN
INTRAMUSCULAR | Status: AC
  Filled 2024-12-16: qty 1

## 2024-12-16 MED ORDER — FLUORESCEIN SODIUM 10 % IV SOLN
INTRAVENOUS | Status: AC
  Filled 2024-12-16: qty 5

## 2024-12-16 MED ORDER — KETOROLAC TROMETHAMINE 30 MG/ML IJ SOLN
INTRAMUSCULAR | Status: DC | PRN
  Administered 2024-12-16: 15 mg via INTRAVENOUS

## 2024-12-16 MED ORDER — ONDANSETRON HCL 4 MG/2ML IJ SOLN
INTRAMUSCULAR | Status: AC
  Filled 2024-12-16: qty 2

## 2024-12-16 MED ORDER — FLUORESCEIN SODIUM 10 % IV SOLN
INTRAVENOUS | Status: DC | PRN
  Administered 2024-12-16: 100 mg via INTRAVENOUS

## 2024-12-16 MED ORDER — ORAL CARE MOUTH RINSE: 15.0000 mL | Freq: Once | OROMUCOSAL | Status: AC

## 2024-12-16 MED ORDER — ACETAMINOPHEN 10 MG/ML IV SOLN: 1000.0000 mg | Freq: Once | INTRAVENOUS | Status: DC | PRN

## 2024-12-16 MED ORDER — MIDAZOLAM HCL (PF) 2 MG/2ML IJ SOLN
INTRAMUSCULAR | Status: DC | PRN
  Administered 2024-12-16: 2 mg via INTRAVENOUS

## 2024-12-16 MED ORDER — FENTANYL CITRATE (PF) 100 MCG/2ML IJ SOLN: 25.0000 ug | INTRAMUSCULAR | Status: DC | PRN

## 2024-12-16 MED ORDER — ACETAMINOPHEN 10 MG/ML IV SOLN
INTRAVENOUS | Status: AC
  Filled 2024-12-16: qty 100

## 2024-12-16 MED ORDER — PHENYLEPHRINE 80 MCG/ML (10ML) SYRINGE FOR IV PUSH (FOR BLOOD PRESSURE SUPPORT)
PREFILLED_SYRINGE | INTRAVENOUS | Status: DC | PRN
  Administered 2024-12-16 (×4): 80 ug via INTRAVENOUS

## 2024-12-16 MED ORDER — FENTANYL CITRATE (PF) 100 MCG/2ML IJ SOLN
INTRAMUSCULAR | Status: DC | PRN
  Administered 2024-12-16 (×2): 25 ug via INTRAVENOUS

## 2024-12-16 MED ORDER — LIDOCAINE HCL (CARDIAC) PF 100 MG/5ML IV SOSY
PREFILLED_SYRINGE | INTRAVENOUS | Status: DC | PRN
  Administered 2024-12-16: 50 mg via INTRAVENOUS

## 2024-12-16 MED ORDER — PHENYLEPHRINE HCL-NACL 20-0.9 MG/250ML-% IV SOLN
INTRAVENOUS | Status: AC
  Filled 2024-12-16: qty 250

## 2024-12-16 MED ORDER — OXYCODONE HCL 5 MG PO TABS
5.0000 mg | ORAL_TABLET | Freq: Once | ORAL | Status: AC | PRN
  Administered 2024-12-16: 5 mg via ORAL

## 2024-12-16 MED ORDER — TAMSULOSIN HCL 0.4 MG PO CAPS
0.4000 mg | ORAL_CAPSULE | Freq: Every day | ORAL | 0 refills | Status: AC
  Filled 2024-12-16: qty 14, 14d supply, fill #0

## 2024-12-16 MED ORDER — MIDAZOLAM HCL 2 MG/2ML IJ SOLN
INTRAMUSCULAR | Status: AC
  Filled 2024-12-16: qty 2

## 2024-12-16 MED ORDER — OXYBUTYNIN CHLORIDE 5 MG PO TABS
5.0000 mg | ORAL_TABLET | Freq: Three times a day (TID) | ORAL | 1 refills | Status: AC | PRN
  Filled 2024-12-16: qty 30, 10d supply, fill #0

## 2024-12-16 MED ORDER — DROPERIDOL 2.5 MG/ML IJ SOLN
INTRAMUSCULAR | Status: AC
  Filled 2024-12-16: qty 2

## 2024-12-16 MED ORDER — LACTATED RINGERS IV SOLN: INTRAVENOUS | Status: DC | PRN

## 2024-12-16 MED ORDER — OXYCODONE HCL 5 MG PO TABS
ORAL_TABLET | ORAL | Status: AC
  Filled 2024-12-16: qty 1

## 2024-12-16 MED ORDER — CHLORHEXIDINE GLUCONATE 0.12 % MT SOLN
OROMUCOSAL | Status: AC
  Filled 2024-12-16: qty 15

## 2024-12-16 MED ORDER — DROPERIDOL 2.5 MG/ML IJ SOLN
0.6250 mg | Freq: Once | INTRAMUSCULAR | Status: AC | PRN
  Administered 2024-12-16: 0.625 mg via INTRAVENOUS

## 2024-12-16 MED ORDER — PHENYLEPHRINE HCL-NACL 20-0.9 MG/250ML-% IV SOLN
INTRAVENOUS | Status: DC | PRN
  Administered 2024-12-16: 40 ug/min via INTRAVENOUS

## 2024-12-16 MED ORDER — OXYCODONE HCL 5 MG/5ML PO SOLN: 5.0000 mg | Freq: Once | ORAL | Status: AC | PRN

## 2024-12-16 MED ORDER — DEXAMETHASONE SOD PHOSPHATE PF 10 MG/ML IJ SOLN
INTRAMUSCULAR | Status: DC | PRN
  Administered 2024-12-16: 4 mg via INTRAVENOUS

## 2024-12-16 MED ORDER — DEXTROSE-SODIUM CHLORIDE 5-0.9 % IV SOLN
INTRAVENOUS | Status: DC | PRN
  Administered 2024-12-16: 250 mL via INTRAVENOUS

## 2024-12-16 MED ORDER — PROPOFOL 10 MG/ML IV BOLUS
INTRAVENOUS | Status: AC
  Filled 2024-12-16: qty 20

## 2024-12-16 MED ORDER — INDOCYANINE GREEN 25 MG IJ SOLR
INTRAMUSCULAR | Status: AC
  Filled 2024-12-16: qty 10

## 2024-12-16 MED ORDER — SODIUM CHLORIDE 0.9 % IV SOLN: INTRAVENOUS | Status: DC

## 2024-12-16 MED ORDER — LIDOCAINE HCL (PF) 2 % IJ SOLN
INTRAMUSCULAR | Status: AC
  Filled 2024-12-16: qty 5

## 2024-12-16 MED ORDER — DEXAMETHASONE SOD PHOSPHATE PF 10 MG/ML IJ SOLN
INTRAMUSCULAR | Status: AC
  Filled 2024-12-16: qty 1

## 2024-12-16 MED ORDER — CHLORHEXIDINE GLUCONATE 0.12 % MT SOLN
15.0000 mL | Freq: Once | OROMUCOSAL | Status: AC
  Administered 2024-12-16: 15 mL via OROMUCOSAL

## 2024-12-16 MED ORDER — ACETAMINOPHEN 10 MG/ML IV SOLN
INTRAVENOUS | Status: DC | PRN
  Administered 2024-12-16: 1000 mg via INTRAVENOUS

## 2024-12-16 MED ORDER — CEFAZOLIN SODIUM-DEXTROSE 2-4 GM/100ML-% IV SOLN
2.0000 g | INTRAVENOUS | Status: AC
  Administered 2024-12-16: 2 g via INTRAVENOUS

## 2024-12-16 MED ORDER — PROPOFOL 10 MG/ML IV BOLUS
INTRAVENOUS | Status: DC | PRN
  Administered 2024-12-16: 150 mg via INTRAVENOUS

## 2024-12-16 MED ORDER — ONDANSETRON HCL 4 MG/2ML IJ SOLN
INTRAMUSCULAR | Status: DC | PRN
  Administered 2024-12-16: 4 mg via INTRAVENOUS

## 2024-12-16 MED ORDER — FENTANYL CITRATE (PF) 100 MCG/2ML IJ SOLN
INTRAMUSCULAR | Status: AC
  Filled 2024-12-16: qty 2

## 2024-12-16 MED ORDER — SODIUM CHLORIDE 0.9 % IR SOLN
Status: DC | PRN
  Administered 2024-12-16: 1

## 2024-12-16 MED ORDER — IOHEXOL 180 MG/ML  SOLN
INTRAMUSCULAR | Status: DC | PRN
  Administered 2024-12-16: 10 mL

## 2024-12-16 MED ORDER — EPHEDRINE SULFATE-NACL 50-0.9 MG/10ML-% IV SOSY
PREFILLED_SYRINGE | INTRAVENOUS | Status: DC | PRN
  Administered 2024-12-16 (×2): 10 mg via INTRAVENOUS
  Administered 2024-12-16: 5 mg via INTRAVENOUS
  Administered 2024-12-16: 10 mg via INTRAVENOUS
  Administered 2024-12-16: 5 mg via INTRAVENOUS
  Administered 2024-12-16: 10 mg via INTRAVENOUS

## 2024-12-16 NOTE — Anesthesia Procedure Notes (Signed)
 Procedure Name: LMA Insertion Date/Time: 12/16/2024 9:00 AM  Performed by: Brandy Almarie BROCKS, CRNAPre-anesthesia Checklist: Patient identified, Emergency Drugs available, Suction available and Patient being monitored Patient Re-evaluated:Patient Re-evaluated prior to induction Oxygen Delivery Method: Circle system utilized Preoxygenation: Pre-oxygenation with 100% oxygen Induction Type: IV induction LMA: LMA inserted LMA Size: 4.0 Number of attempts: 2 Dental Injury: Teeth and Oropharynx as per pre-operative assessment  Comments: LMA 4 straight placed, leak present. IGel 4 placed.

## 2024-12-16 NOTE — Anesthesia Postprocedure Evaluation (Signed)
"   Anesthesia Post Note  Patient: Kerri Steele  Procedure(s) Performed: CYSTOSCOPY/URETEROSCOPY/HOLMIUM LASER/STENT PLACEMENT (Right)  Patient location during evaluation: PACU Anesthesia Type: General Level of consciousness: awake and alert Pain management: pain level controlled Vital Signs Assessment: post-procedure vital signs reviewed and stable Respiratory status: spontaneous breathing, nonlabored ventilation and respiratory function stable Cardiovascular status: blood pressure returned to baseline and stable Postop Assessment: no apparent nausea or vomiting Anesthetic complications: no   No notable events documented.   Last Vitals:  Vitals:   12/16/24 1110 12/16/24 1128  BP:  (!) 121/45  Pulse: 68 68  Resp: 15 18  Temp: 36.6 C 36.6 C  SpO2: 95% 93%    Last Pain:  Vitals:   12/16/24 1128  TempSrc: Temporal  PainSc: 0-No pain                 [name]      "

## 2024-12-16 NOTE — Anesthesia Preprocedure Evaluation (Addendum)
 "                                  Anesthesia Evaluation  Patient identified by MRN, date of birth, ID band Patient awake    Reviewed: Allergy & Precautions, H&P , NPO status , Patient's Chart, lab work & pertinent test results  History of Anesthesia Complications (+) PONV, PROLONGED EMERGENCE and history of anesthetic complications  Airway Mallampati: II  TM Distance: >3 FB Neck ROM: full    Dental no notable dental hx.    Pulmonary shortness of breath, asthma , sleep apnea , COPD, former smoker On supplemental oxygen by nasal cannula (2L/[place] at bedtime)   Pulmonary exam normal        Cardiovascular hypertension, + dysrhythmias Atrial Fibrillation   History of surgical closure of patent foramen ovale    Neuro/Psych  PSYCHIATRIC DISORDERS      Mild cognitive impairmentCVA (2021 R ankle and R hand weakness, blurry Right peripheral vision), Residual Symptoms    GI/Hepatic Neg liver ROS,GERD  Controlled and Medicated,,  Endo/Other  diabetes, Type 2    Renal/GU     Has artificial bladder     Comment:  a.) secondary to congenital absence    Musculoskeletal  (+) Arthritis ,    Abdominal  (+) + obese  Peds  Hematology   Anesthesia Other Findings Past Medical History: No date: Anemia No date: Anginal pain No date: Anticoagulated on apixaban  No date: Anxiety No date: Aortic atherosclerosis No date: Asthma No date: Chronic respiratory failure (HCC) No date: Complication of anesthesia     Comment:  a.) delayed/prolonged emergence No date: COPD (chronic obstructive pulmonary disease) (HCC) No date: DDD (degenerative disc disease), lumbar No date: Depression No date: Esophageal stricture No date: Family history of adverse reaction to anesthesia     Comment:  a.) delayed/prolonged emergence + PONV in 1st degree               relative (mother) No date: GERD (gastroesophageal reflux disease) No date: Has artificial bladder     Comment:  a.) secondary to  congenital absence No date: History of surgical closure of patent foramen ovale (PFO)     Comment:  a.) 25 mm Gore Cardioform device No date: HLD (hyperlipidemia) No date: Hypertension No date: Hypothyroidism No date: Long-term corticosteroid use (prednisone ) No date: Long-term use of high-risk medication     Comment:  a.) dupilumab No date: Mild cognitive impairment     Comment:  a.) on acetylcholinesterase inhibitor (donepazil) No date: On supplemental oxygen by nasal cannula (2L/[place] at bedtime) No date: PAF (paroxysmal atrial fibrillation) (HCC)     Comment:  a.) CHA2DS2VASc = 6 (sex, HTN, CVAx 2, vascular disease,              T2DM) as of 12/10/2024; b.) rate/rhythm maintained               intrinsically without pharmacological intervention;               chronically anticoagulated with apixaban  No date: Pneumonia No date: PONV (postoperative nausea and vomiting) No date: Shortness of breath dyspnea No date: Sleep apnea 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)     Comment:  a.) 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; (+)               residual RIGHT hemiparesis No date: SVT (supraventricular tachycardia) No date: T2DM (type 2  diabetes mellitus) (HCC)  Past Surgical History: No date: ANKLE SURGERY No date: BLADDER SURGERY 08/23/2015: CARDIAC CATHETERIZATION; Left     Comment:  Procedure: Left Heart Cath and Coronary Angiography;                Surgeon: Vinie DELENA Jude, MD;  Location: [facility] INVASIVE CV               LAB;  Service: Cardiovascular;  Laterality: Left; 05/20/2021: COLONOSCOPY WITH PROPOFOL ; N/A     Comment:  Procedure: COLONOSCOPY WITH PROPOFOL ;  Surgeon: Jinny Carmine, MD;  Location: [facility] ENDOSCOPY;  Service:               Endoscopy;  Laterality: N/A; No date: ESOPHAGEAL DILATION 05/20/2021: ESOPHAGOGASTRODUODENOSCOPY (EGD) WITH PROPOFOL ; N/A     Comment:  Procedure: ESOPHAGOGASTRODUODENOSCOPY (EGD) WITH               PROPOFOL ;  Surgeon: Jinny Carmine, MD;   Location: [facility]               ENDOSCOPY;  Service: Endoscopy;  Laterality: N/A; 06/01/2020: LOOP RECORDER INSERTION; N/A     Comment:  Procedure: LOOP RECORDER INSERTION;  Surgeon: Ammon Blunt, MD;  Location: [facility] LAB;  Service:              Cardiovascular;  Laterality: N/A; 09/17/2023: LOOP RECORDER REMOVAL; N/A     Comment:  Procedure: LOOP RECORDER REMOVAL;  Surgeon: Ammon Blunt, MD;  Location: [facility];  Service:              Cardiovascular;  Laterality: N/A; 06/01/2020: TEE WITHOUT CARDIOVERSION; N/A     Comment:  Procedure: TRANSESOPHAGEAL ECHOCARDIOGRAM (TEE);                Surgeon: Hester Wolm PARAS, MD;  Location: [facility] ORS;                Service: Cardiovascular;  Laterality: N/A; No date: TUBAL LIGATION No date: WRIST SURGERY     Reproductive/Obstetrics negative OB ROS                              Anesthesia Physical Anesthesia Plan  ASA: 3  Anesthesia Plan: General   Post-op Pain Management: Toradol  IV (intra-op)* and Ofirmev  IV (intra-op)*   Induction: Intravenous  PONV Risk Score and Plan: Dexamethasone , Ondansetron , Midazolam , Treatment may vary due to age or medical condition, TIVA and Propofol  infusion  Airway Management Planned: LMA and Oral ETT  Additional Equipment:   Intra-op Plan:   Post-operative Plan: Extubation in OR  Informed Consent: I have reviewed the patients History and Physical, chart, labs and discussed the procedure including the risks, benefits and alternatives for the proposed anesthesia with the patient or authorized representative who has indicated his/her understanding and acceptance.     Dental Advisory Given  Plan Discussed with: Anesthesiologist, CRNA and Surgeon  Anesthesia Plan Comments:          Anesthesia Quick Evaluation  "

## 2024-12-16 NOTE — Interval H&P Note (Signed)
 History and Physical Interval Note:  12/16/2024 8:43 AM  Kerri Steele  has presented today for surgery, with the diagnosis of Right Ureteral Stone.  The various methods of treatment have been discussed with the patient and family. After consideration of risks, benefits and other options for treatment, the patient has consented to  Procedures: CYSTOSCOPY/URETEROSCOPY/HOLMIUM LASER/STENT PLACEMENT (Right) as a surgical intervention.  The patient's history has been reviewed, patient examined, no change in status, stable for surgery.  I have reviewed the patient's chart and labs.  Questions were answered to the patient's satisfaction.    History of complex bladder reconstruction.  We again discussed due to altered anatomy ureteroscopy/stone removal may not be successful.  All questions were answered and she desires to proceed.  CV: RRR Lungs: Clear  Sabrina Keough C Marjoria Mancillas

## 2024-12-16 NOTE — Transfer of Care (Signed)
 Immediate Anesthesia Transfer of Care Note  Patient: Kerri Steele  Procedure(s) Performed: CYSTOSCOPY/URETEROSCOPY/HOLMIUM LASER/STENT PLACEMENT (Right)  Patient Location: PACU  Anesthesia Type:General  Level of Consciousness: awake, alert , and oriented  Airway & Oxygen Therapy: Patient Spontanous Breathing and Patient connected to face mask oxygen  Post-op Assessment: Report given to RN, Post -op Vital signs reviewed and stable, and Patient moving all extremities X 4  Post vital signs: Reviewed and stable  Last Vitals:  Vitals Value Taken Time  BP 110/48 12/16/24 10:20  Temp    Pulse 70 12/16/24 10:23  Resp 17 12/16/24 10:23  SpO2 100 % 12/16/24 10:23  Vitals shown include unfiled device data.  Last Pain:  Vitals:   12/16/24 0749  TempSrc: Temporal  PainSc: 4          Complications: No notable events documented.

## 2024-12-16 NOTE — Op Note (Signed)
 "  Preoperative diagnosis:  Right ureteral calculus  Postoperative diagnosis:  Right ureteral calculus  Procedure:  Cystoscopy Right ureteroscopy and stone removal Ureteroscopic laser lithotripsy Right ureteral stent placement (44F/22 cm) Right retrograde pyelography with interpretation  Surgeon: Glendia C. Corvette Orser, M.D.  Anesthesia: General  Complications: None  Intraoperative findings:  Cystoscopy: Urethra normal.  Left UO orthotopic.  No right UO identified on the right hemitrigone.  Superior to the right hemitrigone was a ~2 cm area of mucosal bullous edema.  A UO was not identified but subsequently identified with aid of IV fluorescein  Ureteroscopy: Non- impacted right distal ureteral calculus.  No ureterocele noted Right retrograde pyelography demonstrated mild right hydronephrosis which is chronic based on prior imaging   EBL: Minimal  Specimens: Calculus fragments for analysis   Indication: Kerri Steele is a 64 y.o.  female with a 5 mm right distal ureteral calculus with intermittent right flank pain.  History of reconstructive surgery as an infant.  After reviewing the management options for treatment, the patient elected to proceed with the above surgical procedure(s). We have discussed the potential benefits and risks of the procedure, side effects of the proposed treatment, the likelihood of the patient achieving the goals of the procedure, and any potential problems that might occur during the procedure or recuperation. Informed consent has been obtained.  Description of procedure:  The patient was taken to the operating room and general anesthesia was induced.  The patient was placed in the dorsal lithotomy position, prepped and draped in the usual sterile fashion, and preoperative antibiotics were administered. A preoperative time-out was performed.   A 21 French cystoscope was lubricated and placed per urethra.  Panendoscopy was performed with findings as described  above  A definite ureteral orifice was not identified in the area of bullous edema.  She was given 1 mg of fluorescein  IV and subsequently good left ureteral efflux was noted.  Some staining of the area of bullous edema was noted but not a definite UO.  A mucosal area just inferior to the bullous edema was probed with a guidewire which advanced under fluoroscopy into the region of the right renal pelvis.  The cystoscope was removed and a dual-lumen ureteral catheter was placed over the guidewire and positioned in the region of the mid ureter.  Retrograde pyelogram was performed with findings as described above.  A second Sensor wire was placed and the dual-lumen catheter was removed.  A 4.5 F semirigid ureteroscope was then advanced over the working wire and into the right ureter.  The stone was noted in the midportion of the distal ureter.  The ureteroscope was advanced to the level of the UPJ and no additional calculi were seen  The stone was then fragmented with a 365 m Moses holmium laser fiber at a setting of 0.5 J/80 hz.   All fragments were then removed from the ureter with a zero tip nitinol basket.  Reinspection of the ureter revealed no remaining visible stones or fragments.   Retrograde pyelogram was performed with findings as described above.  A 6 F/22 cm Contour ureteral stent without tether was placed under fluoroscopic guidance.  The wire was then removed with an adequate stent curl noted in the renal pelvis as well as in the bladder.  The bladder was then emptied and the procedure ended.  The patient appeared to tolerate the procedure well and without complications.  After anesthetic reversal the patient was transported to the PACU in stable condition.  Plan: Office cystoscopy with stent removal 10-14 days   Glendia Barba, MD  "

## 2024-12-16 NOTE — Discharge Instructions (Signed)
 DISCHARGE INSTRUCTIONS FOR KIDNEY STONE/URETERAL STENT   MEDICATIONS:  1. Resume all your other meds from home.  2.  AZO (over-the-counter) can help with the burning/stinging when you urinate. 3.  Tamsulosin  and oxybutynin  are for bladder/stent irritation, Rxs were sent to your pharmacy.  ACTIVITY:  1. May resume regular activities in 24 hours. 2. No driving while on narcotic pain medications  3. Drink plenty of water  4. Continue to walk at home - you can still get blood clots when you are at home, so keep active, but don't over do it.  5. May return to work/school tomorrow or when you feel ready    SIGNS/SYMPTOMS TO CALL:  Common postoperative symptoms include urinary frequency, urgency, bladder spasm and blood in the urine  Please call us  if you have a fever greater than 101.5, uncontrolled nausea/vomiting, uncontrolled pain, dizziness, unable to urinate, excessively bloody urine, chest pain, shortness of breath, leg swelling, leg pain, or any other concerns or questions.   You can reach us  at [phone].   FOLLOW-UP:  1. You will be contacted for a follow-up appointment to remove your stent in ~10 days

## 2024-12-17 ENCOUNTER — Encounter: Payer: Self-pay | Admitting: Urology

## 2025-01-04 ENCOUNTER — Encounter: Admitting: Urology
